# Patient Record
Sex: Female | Born: 1935 | Race: White | Hispanic: No | Smoking: Former smoker
Health system: Southern US, Community
[De-identification: ages and names within clinical notes are randomized; demographics above are authoritative.]

## PROBLEM LIST (undated history)

## (undated) DIAGNOSIS — K922 Gastrointestinal hemorrhage, unspecified: Secondary | ICD-10-CM

## (undated) DIAGNOSIS — M719 Bursopathy, unspecified: Secondary | ICD-10-CM

## (undated) DIAGNOSIS — E860 Dehydration: Secondary | ICD-10-CM

## (undated) DIAGNOSIS — K2970 Gastritis, unspecified, without bleeding: Secondary | ICD-10-CM

## (undated) DIAGNOSIS — I10 Essential (primary) hypertension: Secondary | ICD-10-CM

## (undated) DIAGNOSIS — Z87898 Personal history of other specified conditions: Secondary | ICD-10-CM

## (undated) DIAGNOSIS — G4700 Insomnia, unspecified: Secondary | ICD-10-CM

## (undated) DIAGNOSIS — S2249XA Multiple fractures of ribs, unspecified side, initial encounter for closed fracture: Secondary | ICD-10-CM

## (undated) DIAGNOSIS — S2239XA Fracture of one rib, unspecified side, initial encounter for closed fracture: Secondary | ICD-10-CM

## (undated) DIAGNOSIS — S72009A Fracture of unspecified part of neck of unspecified femur, initial encounter for closed fracture: Secondary | ICD-10-CM

## (undated) DIAGNOSIS — E785 Hyperlipidemia, unspecified: Secondary | ICD-10-CM

## (undated) HISTORY — DX: Fracture of unspecified part of neck of unspecified femur, initial encounter for closed fracture: S72.009A

## (undated) HISTORY — DX: Gastritis, unspecified, without bleeding: K29.70

## (undated) HISTORY — DX: Personal history of other specified conditions: Z87.898

## (undated) HISTORY — DX: Fracture of one rib, unspecified side, initial encounter for closed fracture: S22.39XA

## (undated) HISTORY — DX: Insomnia, unspecified: G47.00

## (undated) HISTORY — PX: HEMIARTHROPLASTY HIP: SUR652

## (undated) HISTORY — DX: Bursopathy, unspecified: M71.9

## (undated) HISTORY — DX: Dehydration: E86.0

## (undated) HISTORY — DX: Essential (primary) hypertension: I10

## (undated) HISTORY — DX: Multiple fractures of ribs, unspecified side, initial encounter for closed fracture: S22.49XA

## (undated) HISTORY — DX: Hyperlipidemia, unspecified: E78.5

## (undated) HISTORY — PX: BACK SURGERY: SHX140

## (undated) HISTORY — PX: ABDOMINAL HYSTERECTOMY: SHX81

## (undated) HISTORY — DX: Gastrointestinal hemorrhage, unspecified: K92.2

---

## 2004-05-20 ENCOUNTER — Ambulatory Visit: Payer: Self-pay | Admitting: Family Medicine

## 2004-10-10 ENCOUNTER — Ambulatory Visit: Payer: Self-pay | Admitting: Family Medicine

## 2005-06-04 ENCOUNTER — Other Ambulatory Visit: Payer: Self-pay

## 2005-06-04 ENCOUNTER — Emergency Department: Payer: Self-pay | Admitting: General Practice

## 2006-01-25 ENCOUNTER — Ambulatory Visit: Payer: Self-pay | Admitting: Family Medicine

## 2006-12-30 ENCOUNTER — Emergency Department: Payer: Self-pay | Admitting: Emergency Medicine

## 2007-02-13 ENCOUNTER — Emergency Department: Payer: Self-pay | Admitting: Emergency Medicine

## 2007-02-28 HISTORY — PX: JOINT REPLACEMENT: SHX530

## 2007-04-28 DIAGNOSIS — S72009A Fracture of unspecified part of neck of unspecified femur, initial encounter for closed fracture: Secondary | ICD-10-CM

## 2007-04-28 HISTORY — DX: Fracture of unspecified part of neck of unspecified femur, initial encounter for closed fracture: S72.009A

## 2007-05-23 ENCOUNTER — Other Ambulatory Visit: Payer: Self-pay

## 2007-05-23 ENCOUNTER — Inpatient Hospital Stay: Payer: Self-pay | Admitting: Specialist

## 2007-05-30 ENCOUNTER — Encounter: Payer: Self-pay | Admitting: Internal Medicine

## 2007-06-12 ENCOUNTER — Ambulatory Visit: Payer: Self-pay | Admitting: Internal Medicine

## 2007-09-02 ENCOUNTER — Ambulatory Visit: Payer: Self-pay | Admitting: Internal Medicine

## 2007-09-11 ENCOUNTER — Ambulatory Visit: Payer: Self-pay | Admitting: Internal Medicine

## 2007-10-29 DIAGNOSIS — K922 Gastrointestinal hemorrhage, unspecified: Secondary | ICD-10-CM

## 2007-10-29 HISTORY — DX: Gastrointestinal hemorrhage, unspecified: K92.2

## 2007-11-06 ENCOUNTER — Inpatient Hospital Stay: Payer: Self-pay | Admitting: Internal Medicine

## 2007-11-08 ENCOUNTER — Other Ambulatory Visit: Payer: Self-pay

## 2008-11-23 ENCOUNTER — Emergency Department: Payer: Self-pay | Admitting: Emergency Medicine

## 2009-03-16 ENCOUNTER — Ambulatory Visit: Payer: Self-pay | Admitting: Internal Medicine

## 2009-11-17 ENCOUNTER — Ambulatory Visit: Payer: Self-pay | Admitting: Specialist

## 2009-11-26 ENCOUNTER — Ambulatory Visit: Payer: Self-pay | Admitting: Cardiovascular Disease

## 2009-12-01 ENCOUNTER — Ambulatory Visit: Payer: Self-pay | Admitting: Specialist

## 2010-01-12 ENCOUNTER — Emergency Department: Payer: Self-pay | Admitting: Emergency Medicine

## 2010-05-09 ENCOUNTER — Ambulatory Visit: Payer: Self-pay | Admitting: Internal Medicine

## 2010-06-14 ENCOUNTER — Ambulatory Visit: Payer: Self-pay | Admitting: Ophthalmology

## 2010-06-27 ENCOUNTER — Other Ambulatory Visit: Payer: Self-pay | Admitting: Internal Medicine

## 2010-06-28 ENCOUNTER — Ambulatory Visit: Payer: Self-pay | Admitting: Ophthalmology

## 2010-07-05 ENCOUNTER — Emergency Department: Payer: Self-pay | Admitting: Emergency Medicine

## 2010-10-05 ENCOUNTER — Encounter: Payer: Self-pay | Admitting: Internal Medicine

## 2010-12-20 ENCOUNTER — Encounter: Payer: Self-pay | Admitting: Internal Medicine

## 2010-12-21 ENCOUNTER — Encounter: Payer: Self-pay | Admitting: Internal Medicine

## 2010-12-21 ENCOUNTER — Ambulatory Visit (INDEPENDENT_AMBULATORY_CARE_PROVIDER_SITE_OTHER): Payer: Medicaid Other | Admitting: Internal Medicine

## 2010-12-21 VITALS — BP 110/66 | HR 94 | Temp 98.1°F | Resp 16 | Ht 63.5 in | Wt 142.8 lb

## 2010-12-21 DIAGNOSIS — M8080XA Other osteoporosis with current pathological fracture, unspecified site, initial encounter for fracture: Secondary | ICD-10-CM

## 2010-12-21 DIAGNOSIS — R634 Abnormal weight loss: Secondary | ICD-10-CM

## 2010-12-21 DIAGNOSIS — Z79899 Other long term (current) drug therapy: Secondary | ICD-10-CM

## 2010-12-21 DIAGNOSIS — M8440XA Pathological fracture, unspecified site, initial encounter for fracture: Secondary | ICD-10-CM

## 2010-12-21 DIAGNOSIS — F1010 Alcohol abuse, uncomplicated: Secondary | ICD-10-CM

## 2010-12-21 DIAGNOSIS — Z1239 Encounter for other screening for malignant neoplasm of breast: Secondary | ICD-10-CM

## 2010-12-21 DIAGNOSIS — H9312 Tinnitus, left ear: Secondary | ICD-10-CM

## 2010-12-21 DIAGNOSIS — H9319 Tinnitus, unspecified ear: Secondary | ICD-10-CM

## 2010-12-21 DIAGNOSIS — D696 Thrombocytopenia, unspecified: Secondary | ICD-10-CM

## 2010-12-21 LAB — COMPREHENSIVE METABOLIC PANEL
ALT: 20 U/L (ref 0–35)
Alkaline Phosphatase: 81 U/L (ref 39–117)
Creatinine, Ser: 0.7 mg/dL (ref 0.4–1.2)
Glucose, Bld: 99 mg/dL (ref 70–99)
Sodium: 139 mEq/L (ref 135–145)
Total Bilirubin: 0.9 mg/dL (ref 0.3–1.2)
Total Protein: 7.4 g/dL (ref 6.0–8.3)

## 2010-12-21 LAB — DIFFERENTIAL
Basophils Absolute: 0 10*3/uL (ref 0.0–0.1)
Eosinophils Relative: 1 % (ref 0–5)
Lymphocytes Relative: 24 % (ref 12–46)
Monocytes Absolute: 0.6 10*3/uL (ref 0.1–1.0)
Monocytes Relative: 8 % (ref 3–12)
Neutro Abs: 4.7 10*3/uL (ref 1.7–7.7)

## 2010-12-21 LAB — LIPID PANEL
HDL: 45.4 mg/dL (ref >39.00)
LDL Cholesterol: 98 mg/dL (ref 0–99)
Total CHOL/HDL Ratio: 4
Triglycerides: 89 mg/dL (ref 0.0–149.0)
VLDL: 17.8 mg/dL (ref 0.0–40.0)

## 2010-12-21 LAB — CBC
HCT: 42.3 % (ref 36.0–46.0)
Hemoglobin: 14.2 g/dL (ref 12.0–15.0)
MCV: 94.8 fL (ref 78.0–100.0)
RDW: 13.5 % (ref 11.5–15.5)
WBC: 7.1 10*3/uL (ref 4.0–10.5)

## 2010-12-21 LAB — TSH: TSH: 1.64 u[IU]/mL (ref 0.35–5.50)

## 2010-12-21 NOTE — Progress Notes (Signed)
  Subjective:    Patient ID: Alyssa Ortega, female    DOB: [birth year], 77 y.o.   MRN: [id number]  HPI Alyssa Ortega is a 77 yo white female with history of left hip fracture secondary to fall, s/p replacement, hypertension,  Ongoing occult alcohol abuse with history of GI bleed secondary to duodenal ulcer who presents for general followup .  Shehas lost 7 lbs since July.  She has periods of malaise but states that her appetite is good.  She is sleeping better since she started taking 1/2 tablet of mirtazipine but continues to have episodes of pounding in her ears that wake her from sleep.  She had had no recent falls,  And denies and new or chonic pain issues. She lives alone and prepares her own meals but does not drive due to loss of visio from cataracts.  She denies ongoing alcohol abuse but there are 2 ER visit (Nov 2011 and May 2012) where her BAL was > 0.08. Past Medical History  Diagnosis Date  . Hyperlipidemia   . Hypertension   . GI bleed 10/2007    secondary to duodenal ulcers  . History of epistaxis     had MRI by Dr. Chestine Spore  . Hip fracture 3/09    left, s/p hemiarthroplasty  . Rib fractures     traumatic  . Insomnia   . Osteoporosis    Current Outpatient Prescriptions on File Prior to Visit  Medication Sig Dispense Refill  . Cholecalciferol (VITAMIN D PO) Take by mouth.        Marland Kitchen lisinopril (PRINIVIL,ZESTRIL) 40 MG tablet Take 40 mg by mouth daily.          Review of Systems  Constitutional: Positive for unexpected weight change. Negative for fever and chills.  HENT: Negative for hearing loss, ear pain, nosebleeds, congestion, sore throat, facial swelling, rhinorrhea, sneezing, mouth sores, trouble swallowing, neck pain, neck stiffness, voice change, postnasal drip, sinus pressure, tinnitus and ear discharge.   Eyes: Negative for pain, discharge, redness and visual disturbance.  Respiratory: Negative for cough, chest tightness, shortness of breath, wheezing and stridor.     Cardiovascular: Negative for chest pain, palpitations and leg swelling.  Musculoskeletal: Negative for myalgias and arthralgias.  Skin: Negative for color change and rash.  Neurological: Negative for dizziness, weakness, light-headedness and headaches.  Hematological: Negative for adenopathy.  All other systems reviewed and are negative.       Objective:   Physical Exam  Vitals reviewed. Constitutional: She is oriented to person, place, and time. She appears well-developed and well-nourished.  HENT:  Mouth/Throat: Oropharynx is clear and moist.  Eyes: EOM are normal. Pupils are equal, round, and reactive to light. No scleral icterus.  Neck: Normal range of motion. Neck supple. No JVD present. No thyromegaly present.  Cardiovascular: Normal rate, regular rhythm, normal heart sounds and intact distal pulses.   Pulmonary/Chest: Effort normal and breath sounds normal.  Abdominal: Soft. Bowel sounds are normal. She exhibits no mass. There is no tenderness.  Musculoskeletal: Normal range of motion. She exhibits no edema.  Lymphadenopathy:    She has no cervical adenopathy.  Neurological: She is alert and oriented to person, place, and time.  Skin: Skin is warm and dry.  Psychiatric: She has a normal mood and affect.          Assessment & Plan:

## 2010-12-21 NOTE — Patient Instructions (Signed)
We will call you with your lab results.   continue your current medications and use your cane whenever walking

## 2010-12-22 ENCOUNTER — Telehealth: Payer: Self-pay | Admitting: *Deleted

## 2010-12-22 ENCOUNTER — Encounter: Payer: Self-pay | Admitting: Internal Medicine

## 2010-12-22 DIAGNOSIS — D696 Thrombocytopenia, unspecified: Secondary | ICD-10-CM | POA: Insufficient documentation

## 2010-12-22 DIAGNOSIS — M8080XA Other osteoporosis with current pathological fracture, unspecified site, initial encounter for fracture: Secondary | ICD-10-CM | POA: Insufficient documentation

## 2010-12-22 DIAGNOSIS — F1010 Alcohol abuse, uncomplicated: Secondary | ICD-10-CM | POA: Insufficient documentation

## 2010-12-22 DIAGNOSIS — Z87898 Personal history of other specified conditions: Secondary | ICD-10-CM | POA: Insufficient documentation

## 2010-12-22 DIAGNOSIS — Z1239 Encounter for other screening for malignant neoplasm of breast: Secondary | ICD-10-CM | POA: Insufficient documentation

## 2010-12-22 DIAGNOSIS — R634 Abnormal weight loss: Secondary | ICD-10-CM | POA: Insufficient documentation

## 2010-12-22 DIAGNOSIS — S2239XA Fracture of one rib, unspecified side, initial encounter for closed fracture: Secondary | ICD-10-CM | POA: Insufficient documentation

## 2010-12-22 DIAGNOSIS — S2249XA Multiple fractures of ribs, unspecified side, initial encounter for closed fracture: Secondary | ICD-10-CM | POA: Insufficient documentation

## 2010-12-22 DIAGNOSIS — H9312 Tinnitus, left ear: Secondary | ICD-10-CM | POA: Insufficient documentation

## 2010-12-22 NOTE — Assessment & Plan Note (Signed)
She is not a candidate for bisphosphonate therapy due to duodenal ulcer and ongoing alcohol abuse. Will discuss Prolia with her at next visit.

## 2010-12-22 NOTE — Assessment & Plan Note (Signed)
Mild, epsiodic, likely related to alcohol abuse since she has normal WBC and is not anemic. Will check again in 3 months along with ann LDH.

## 2010-12-22 NOTE — Assessment & Plan Note (Signed)
Not sure what the cause is or whether tinnitus is the correct diagnosis.  It occurs only in the middle of the night. Given her accompanying headache, I sent her for an MRI last March which was negative for masses and strokes. Could be related to blood pressure and alcohol abuse.    Will send her for audiology eval if symptoms start to occur during the day.

## 2010-12-22 NOTE — Telephone Encounter (Signed)
Error

## 2010-12-22 NOTE — Assessment & Plan Note (Signed)
Her weight loss is concerning for occult malignancy since she has has eschewed regular cancer screening.  However, given her concurrent thrombocytopenia I suspect that the cause is malnutrition and alcohol abuse , which she has denied but is confirmed by review of her last 2 ER visits .  Will discuss this witherh er at next visit.

## 2010-12-22 NOTE — Assessment & Plan Note (Signed)
She has had GI bleeds and delirium in the past and per review of ER records continues to abuse alcohol to the point of inebriation.  She now has mild thrombocytopenia.  Will discuss this with her at next visit.

## 2010-12-28 ENCOUNTER — Telehealth: Payer: Self-pay | Admitting: *Deleted

## 2010-12-28 MED ORDER — AMOXICILLIN-POT CLAVULANATE 875-125 MG PO TABS: 1.0000 | ORAL_TABLET | Freq: Two times a day (BID) | ORAL | Status: AC

## 2010-12-28 NOTE — Telephone Encounter (Signed)
Notified patient of message, Rx has been called in.   

## 2010-12-28 NOTE — Telephone Encounter (Signed)
Patient says that she has had a a lot of congestion, coughing up phlegm, has had fever on and off. Feels week. I advised her to make an appt, but she says that she doesn't want to make an appt. She says that she would either like something called in or advise of what to take otc. Please advise.

## 2010-12-28 NOTE — Telephone Encounter (Signed)
Please see previous closed re authorization to call her in  augmentin  Rx,  tylenol and Delsym OTC for fevers and cough

## 2010-12-28 NOTE — Telephone Encounter (Signed)
Addended by: Darletta Moll A on: 12/28/2010 02:56 PM   Modules accepted: Orders

## 2010-12-28 NOTE — Telephone Encounter (Signed)
She was just seen recently.  Does not need to come in .Marland KitchenRx Augmentin 875/125 one tablet twice daily   With food for 7 days  #14  No refills.   Delsym, OTC for cough,  tylonol for muscle aches and fevers.

## 2011-02-04 ENCOUNTER — Emergency Department: Payer: Self-pay | Admitting: Emergency Medicine

## 2011-02-27 ENCOUNTER — Inpatient Hospital Stay: Payer: Self-pay | Admitting: Internal Medicine

## 2011-02-28 LAB — LIPID PANEL
Cholesterol: 117 mg/dL (ref 0–200)
HDL Cholesterol: 30 mg/dL — ABNORMAL LOW (ref 40–60)
Ldl Cholesterol, Calc: 69 mg/dL (ref 0–100)
Triglycerides: 88 mg/dL (ref 0–200)
VLDL Cholesterol, Calc: 18 mg/dL (ref 5–40)

## 2011-02-28 LAB — TROPONIN I: Troponin-I: 0.05 ng/mL

## 2011-02-28 LAB — LIPASE, BLOOD: Lipase: 250 U/L (ref 73–393)

## 2011-02-28 LAB — HEMOGLOBIN A1C: Hemoglobin A1C: 4.7 % (ref 4.2–6.3)

## 2011-03-24 ENCOUNTER — Ambulatory Visit: Payer: Medicaid Other | Admitting: Internal Medicine

## 2011-05-10 ENCOUNTER — Emergency Department: Payer: Self-pay

## 2011-06-11 ENCOUNTER — Emergency Department: Payer: Self-pay | Admitting: Emergency Medicine

## 2011-06-11 LAB — ETHANOL: Ethanol: 249 mg/dL

## 2011-09-11 ENCOUNTER — Inpatient Hospital Stay: Payer: Self-pay | Admitting: Orthopedic Surgery

## 2011-09-11 LAB — URINALYSIS, COMPLETE
Ketone: NEGATIVE
Ph: 6 (ref 4.5–8.0)
Protein: NEGATIVE
RBC,UR: <1 /HPF (ref 0–5)
Specific Gravity: 1.004 (ref 1.003–1.030)

## 2011-09-11 LAB — CBC WITH DIFFERENTIAL/PLATELET
Basophil #: 0 10*3/uL (ref 0.0–0.1)
Eosinophil %: 0.5 %
HCT: 35.9 % (ref 35.0–47.0)
HGB: 12 g/dL (ref 12.0–16.0)
Lymphocyte #: 1.3 10*3/uL (ref 1.0–3.6)
MCH: 30.9 pg (ref 26.0–34.0)
MCHC: 33.5 g/dL (ref 32.0–36.0)
Monocyte #: 0.6 x10 3/mm (ref 0.2–0.9)
Neutrophil #: 6 10*3/uL (ref 1.4–6.5)
Platelet: 128 10*3/uL — ABNORMAL LOW (ref 150–440)
RBC: 3.9 10*6/uL (ref 3.80–5.20)

## 2011-09-11 LAB — COMPREHENSIVE METABOLIC PANEL
Alkaline Phosphatase: 95 U/L (ref 50–136)
BUN: 4 mg/dL — ABNORMAL LOW (ref 7–18)
Bilirubin,Total: 0.3 mg/dL (ref 0.2–1.0)
Calcium, Total: 8.2 mg/dL — ABNORMAL LOW (ref 8.5–10.1)
Creatinine: 0.6 mg/dL (ref 0.60–1.30)
EGFR (African American): >60
EGFR (Non-African Amer.): >60
Glucose: 95 mg/dL (ref 65–99)
Potassium: 3.5 mmol/L (ref 3.5–5.1)
SGOT(AST): 40 U/L — ABNORMAL HIGH (ref 15–37)
SGPT (ALT): 27 U/L
Sodium: 131 mmol/L — ABNORMAL LOW (ref 136–145)
Total Protein: 6.5 g/dL (ref 6.4–8.2)

## 2011-09-11 LAB — PROTIME-INR: INR: 1

## 2011-09-11 LAB — APTT: Activated PTT: 32.6 secs (ref 23.6–35.9)

## 2011-09-11 LAB — CK: CK, Total: 73 U/L (ref 21–215)

## 2011-09-11 LAB — TROPONIN I: Troponin-I: <0.02 ng/mL

## 2011-09-12 LAB — BASIC METABOLIC PANEL
Chloride: 100 mmol/L (ref 98–107)
Co2: 24 mmol/L (ref 21–32)
Creatinine: 0.74 mg/dL (ref 0.60–1.30)
EGFR (African American): >60
Sodium: 133 mmol/L — ABNORMAL LOW (ref 136–145)

## 2011-09-12 LAB — PLATELET COUNT: Platelet: 104 10*3/uL — ABNORMAL LOW (ref 150–440)

## 2011-09-15 LAB — PATHOLOGY REPORT

## 2011-09-17 ENCOUNTER — Other Ambulatory Visit: Payer: Self-pay | Admitting: Family Medicine

## 2011-09-17 LAB — URINALYSIS, COMPLETE
Bilirubin,UR: NEGATIVE
Blood: NEGATIVE
Ph: 6 (ref 4.5–8.0)
Protein: NEGATIVE
RBC,UR: NONE SEEN /HPF (ref 0–5)
Squamous Epithelial: <1
WBC UR: 7 /HPF (ref 0–5)

## 2011-10-25 ENCOUNTER — Other Ambulatory Visit: Payer: Self-pay | Admitting: Internal Medicine

## 2011-10-25 MED ORDER — LISINOPRIL 40 MG PO TABS: 40.0000 mg | ORAL_TABLET | Freq: Every day | ORAL | Status: DC

## 2011-11-22 ENCOUNTER — Ambulatory Visit: Payer: Self-pay | Admitting: Family Medicine

## 2012-01-02 ENCOUNTER — Other Ambulatory Visit: Payer: Self-pay

## 2012-01-04 ENCOUNTER — Other Ambulatory Visit: Payer: Self-pay

## 2012-03-12 ENCOUNTER — Ambulatory Visit: Payer: Self-pay | Admitting: Family Medicine

## 2012-05-09 ENCOUNTER — Ambulatory Visit: Payer: Self-pay | Admitting: Family Medicine

## 2012-07-02 ENCOUNTER — Emergency Department: Payer: Self-pay | Admitting: Emergency Medicine

## 2012-07-12 ENCOUNTER — Emergency Department: Payer: Self-pay | Admitting: Emergency Medicine

## 2012-07-13 LAB — URINALYSIS, COMPLETE
Bilirubin,UR: NEGATIVE
Ketone: NEGATIVE
Nitrite: POSITIVE
Ph: 6 (ref 4.5–8.0)
RBC,UR: 8 /HPF (ref 0–5)
Specific Gravity: 1.003 (ref 1.003–1.030)
Squamous Epithelial: 5

## 2012-07-13 LAB — COMPREHENSIVE METABOLIC PANEL
Anion Gap: 10 (ref 7–16)
Co2: 23 mmol/L (ref 21–32)
Glucose: 98 mg/dL (ref 65–99)
Potassium: 4 mmol/L (ref 3.5–5.1)

## 2012-07-13 LAB — CBC
HGB: 11.6 g/dL — ABNORMAL LOW (ref 12.0–16.0)
MCV: 91 fL (ref 80–100)
Platelet: 189 10*3/uL (ref 150–440)
RDW: 15 % — ABNORMAL HIGH (ref 11.5–14.5)
WBC: 5.3 10*3/uL (ref 3.6–11.0)

## 2012-07-13 LAB — ETHANOL
Ethanol %: 0.15 % — ABNORMAL HIGH (ref 0.000–0.080)
Ethanol: 150 mg/dL

## 2012-08-07 ENCOUNTER — Emergency Department: Payer: Self-pay | Admitting: Emergency Medicine

## 2012-08-07 LAB — BASIC METABOLIC PANEL
Anion Gap: 12 (ref 7–16)
BUN: 3 mg/dL — ABNORMAL LOW (ref 7–18)
Calcium, Total: 8.3 mg/dL — ABNORMAL LOW (ref 8.5–10.1)
Chloride: 101 mmol/L (ref 98–107)
Creatinine: 0.49 mg/dL — ABNORMAL LOW (ref 0.60–1.30)
EGFR (African American): >60
EGFR (Non-African Amer.): >60
Glucose: 110 mg/dL — ABNORMAL HIGH (ref 65–99)
Osmolality: 269 (ref 275–301)
Potassium: 3.9 mmol/L (ref 3.5–5.1)
Sodium: 136 mmol/L (ref 136–145)

## 2012-08-07 LAB — ETHANOL: Ethanol: 141 mg/dL

## 2012-08-07 LAB — CBC
HCT: 35.9 % (ref 35.0–47.0)
HGB: 12.3 g/dL (ref 12.0–16.0)
RDW: 14.6 % — ABNORMAL HIGH (ref 11.5–14.5)
WBC: 7 10*3/uL (ref 3.6–11.0)

## 2012-09-07 ENCOUNTER — Observation Stay: Payer: Self-pay | Admitting: Internal Medicine

## 2012-09-07 LAB — URINALYSIS, COMPLETE
Blood: NEGATIVE
Glucose,UR: NEGATIVE mg/dL (ref 0–75)
Ketone: NEGATIVE
Leukocyte Esterase: NEGATIVE
Ph: 7 (ref 4.5–8.0)
Protein: NEGATIVE
RBC,UR: <1 /HPF (ref 0–5)
Specific Gravity: 1.006 (ref 1.003–1.030)
Squamous Epithelial: 2
WBC UR: 1 /HPF (ref 0–5)

## 2012-09-07 LAB — ETHANOL: Ethanol: <3 mg/dL

## 2012-09-07 LAB — COMPREHENSIVE METABOLIC PANEL
Alkaline Phosphatase: 204 U/L — ABNORMAL HIGH (ref 50–136)
Anion Gap: 10 (ref 7–16)
Co2: 28 mmol/L (ref 21–32)
Creatinine: 0.73 mg/dL (ref 0.60–1.30)
EGFR (Non-African Amer.): >60
Glucose: 108 mg/dL — ABNORMAL HIGH (ref 65–99)
Osmolality: 249 (ref 275–301)
SGOT(AST): 34 U/L (ref 15–37)
Sodium: 125 mmol/L — ABNORMAL LOW (ref 136–145)
Total Protein: 7.5 g/dL (ref 6.4–8.2)

## 2012-09-07 LAB — CBC
HGB: 13.1 g/dL (ref 12.0–16.0)
MCHC: 34.9 g/dL (ref 32.0–36.0)
MCV: 88 fL (ref 80–100)
Platelet: 184 10*3/uL (ref 150–440)
RBC: 4.29 10*6/uL (ref 3.80–5.20)
RDW: 15.4 % — ABNORMAL HIGH (ref 11.5–14.5)

## 2012-09-07 LAB — URIC ACID: Uric Acid: 3.9 mg/dL (ref 2.6–6.0)

## 2012-09-07 LAB — OSMOLALITY: Osmolality: 271 mOsm/kg — ABNORMAL LOW (ref 280–301)

## 2012-09-07 LAB — SODIUM, URINE, RANDOM: Sodium, Urine Random: 24 mmol/L (ref 20–110)

## 2012-09-07 LAB — OSMOLALITY, URINE: Osmolality: 149 mOsm/kg

## 2012-09-08 ENCOUNTER — Ambulatory Visit: Payer: Self-pay | Admitting: Orthopedic Surgery

## 2012-09-08 LAB — BASIC METABOLIC PANEL
Anion Gap: 4 — ABNORMAL LOW (ref 7–16)
Calcium, Total: 8.3 mg/dL — ABNORMAL LOW (ref 8.5–10.1)
Chloride: 99 mmol/L (ref 98–107)
Creatinine: 0.74 mg/dL (ref 0.60–1.30)
Osmolality: 264 (ref 275–301)
Sodium: 134 mmol/L — ABNORMAL LOW (ref 136–145)

## 2012-09-08 LAB — MAGNESIUM: Magnesium: 1.8 mg/dL

## 2012-09-08 LAB — CBC WITH DIFFERENTIAL/PLATELET
Eosinophil #: 0.1 10*3/uL (ref 0.0–0.7)
HCT: 33.1 % — ABNORMAL LOW (ref 35.0–47.0)
HGB: 11.2 g/dL — ABNORMAL LOW (ref 12.0–16.0)
Lymphocyte #: 1.1 10*3/uL (ref 1.0–3.6)
Lymphocyte %: 26.8 %
MCH: 30.6 pg (ref 26.0–34.0)
MCV: 90 fL (ref 80–100)
Monocyte #: 0.5 x10 3/mm (ref 0.2–0.9)
Neutrophil %: 56.6 %
RBC: 3.68 10*6/uL — ABNORMAL LOW (ref 3.80–5.20)
RDW: 15.1 % — ABNORMAL HIGH (ref 11.5–14.5)
WBC: 4.1 10*3/uL (ref 3.6–11.0)

## 2012-09-11 LAB — BASIC METABOLIC PANEL
Calcium, Total: 8.2 mg/dL — ABNORMAL LOW (ref 8.5–10.1)
Chloride: 106 mmol/L (ref 98–107)
Co2: 25 mmol/L (ref 21–32)
EGFR (African American): >60
Glucose: 60 mg/dL — ABNORMAL LOW (ref 65–99)
Osmolality: 272 (ref 275–301)
Potassium: 3 mmol/L — ABNORMAL LOW (ref 3.5–5.1)
Sodium: 139 mmol/L (ref 136–145)

## 2012-09-12 LAB — BASIC METABOLIC PANEL
Anion Gap: 9 (ref 7–16)
BUN: 4 mg/dL — ABNORMAL LOW (ref 7–18)
Chloride: 104 mmol/L (ref 98–107)
Co2: 24 mmol/L (ref 21–32)
Creatinine: 0.48 mg/dL — ABNORMAL LOW (ref 0.60–1.30)
EGFR (African American): >60
EGFR (Non-African Amer.): >60
Osmolality: 270 (ref 275–301)
Potassium: 3.5 mmol/L (ref 3.5–5.1)

## 2012-09-13 LAB — PATHOLOGY REPORT

## 2012-11-18 ENCOUNTER — Ambulatory Visit: Payer: Self-pay | Admitting: Orthopedic Surgery

## 2012-11-20 ENCOUNTER — Ambulatory Visit: Payer: Self-pay | Admitting: Orthopedic Surgery

## 2012-11-21 LAB — PATHOLOGY REPORT

## 2012-12-12 ENCOUNTER — Emergency Department: Payer: Self-pay | Admitting: Emergency Medicine

## 2012-12-12 LAB — URINALYSIS, COMPLETE
Bacteria: NONE SEEN
Blood: NEGATIVE
Glucose,UR: NEGATIVE mg/dL (ref 0–75)
Nitrite: NEGATIVE
Protein: NEGATIVE
RBC,UR: NONE SEEN /HPF (ref 0–5)
Specific Gravity: 1.003 (ref 1.003–1.030)
WBC UR: <1 /HPF (ref 0–5)

## 2012-12-18 ENCOUNTER — Emergency Department: Payer: Self-pay | Admitting: Emergency Medicine

## 2012-12-18 LAB — ETHANOL
Ethanol %: 0.234 % — ABNORMAL HIGH (ref 0.000–0.080)
Ethanol: 234 mg/dL

## 2012-12-18 LAB — COMPREHENSIVE METABOLIC PANEL
Alkaline Phosphatase: 138 U/L — ABNORMAL HIGH (ref 50–136)
BUN: 5 mg/dL — ABNORMAL LOW (ref 7–18)
Calcium, Total: 8.8 mg/dL (ref 8.5–10.1)
Chloride: 94 mmol/L — ABNORMAL LOW (ref 98–107)
Co2: 25 mmol/L (ref 21–32)
Creatinine: 0.58 mg/dL — ABNORMAL LOW (ref 0.60–1.30)
EGFR (African American): >60
EGFR (Non-African Amer.): >60
Glucose: 102 mg/dL — ABNORMAL HIGH (ref 65–99)
Osmolality: 251 (ref 275–301)
Potassium: 4.3 mmol/L (ref 3.5–5.1)
SGPT (ALT): 16 U/L (ref 12–78)

## 2012-12-18 LAB — CBC
HGB: 13 g/dL (ref 12.0–16.0)
MCH: 31.4 pg (ref 26.0–34.0)
MCHC: 34.9 g/dL (ref 32.0–36.0)
MCV: 90 fL (ref 80–100)
Platelet: 191 10*3/uL (ref 150–440)
RBC: 4.13 10*6/uL (ref 3.80–5.20)
RDW: 15.6 % — ABNORMAL HIGH (ref 11.5–14.5)
WBC: 6.3 10*3/uL (ref 3.6–11.0)

## 2013-04-10 ENCOUNTER — Ambulatory Visit: Payer: Self-pay | Admitting: Orthopedic Surgery

## 2013-04-10 LAB — CBC
HCT: 39.2 % (ref 35.0–47.0)
HGB: 13.1 g/dL (ref 12.0–16.0)
MCH: 31.3 pg (ref 26.0–34.0)
MCHC: 33.4 g/dL (ref 32.0–36.0)
MCV: 94 fL (ref 80–100)
PLATELETS: 112 10*3/uL — AB (ref 150–440)
RBC: 4.17 10*6/uL (ref 3.80–5.20)
RDW: 14.8 % — ABNORMAL HIGH (ref 11.5–14.5)
WBC: 7.8 10*3/uL (ref 3.6–11.0)

## 2013-04-10 LAB — URINALYSIS, COMPLETE
Bilirubin,UR: NEGATIVE
Blood: NEGATIVE
Glucose,UR: NEGATIVE mg/dL (ref 0–75)
KETONE: NEGATIVE
Nitrite: NEGATIVE
PROTEIN: NEGATIVE
Ph: 6 (ref 4.5–8.0)
SPECIFIC GRAVITY: 1.017 (ref 1.003–1.030)
Squamous Epithelial: 9
WBC UR: 60 /HPF (ref 0–5)

## 2013-04-10 LAB — PROTIME-INR
INR: 1
Prothrombin Time: 12.9 secs (ref 11.5–14.7)

## 2013-04-10 LAB — BASIC METABOLIC PANEL
ANION GAP: 6 — AB (ref 7–16)
BUN: 8 mg/dL (ref 7–18)
CO2: 27 mmol/L (ref 21–32)
Calcium, Total: 9.5 mg/dL (ref 8.5–10.1)
Chloride: 100 mmol/L (ref 98–107)
Creatinine: 0.58 mg/dL — ABNORMAL LOW (ref 0.60–1.30)
EGFR (African American): >60
EGFR (Non-African Amer.): >60
Glucose: 105 mg/dL — ABNORMAL HIGH (ref 65–99)
Osmolality: 265 (ref 275–301)
POTASSIUM: 3.7 mmol/L (ref 3.5–5.1)
SODIUM: 133 mmol/L — AB (ref 136–145)

## 2013-04-10 LAB — APTT: ACTIVATED PTT: 31.7 s (ref 23.6–35.9)

## 2013-04-10 LAB — SEDIMENTATION RATE: ERYTHROCYTE SED RATE: 20 mm/h (ref 0–30)

## 2013-04-11 LAB — MRSA PCR SCREENING

## 2013-04-22 ENCOUNTER — Inpatient Hospital Stay: Payer: Self-pay | Admitting: Orthopedic Surgery

## 2013-04-22 LAB — CBC WITH DIFFERENTIAL/PLATELET
BASOS ABS: 0.1 10*3/uL (ref 0.0–0.1)
BASOS PCT: 0.9 %
EOS ABS: 0.1 10*3/uL (ref 0.0–0.7)
EOS PCT: 2.2 %
HCT: 40.9 % (ref 35.0–47.0)
HGB: 13.7 g/dL (ref 12.0–16.0)
Lymphocyte #: 1.2 10*3/uL (ref 1.0–3.6)
Lymphocyte %: 21.1 %
MCH: 32 pg (ref 26.0–34.0)
MCHC: 33.6 g/dL (ref 32.0–36.0)
MCV: 95 fL (ref 80–100)
Monocyte #: 0.5 x10 3/mm (ref 0.2–0.9)
Monocyte %: 9 %
Neutrophil #: 3.9 10*3/uL (ref 1.4–6.5)
Neutrophil %: 66.8 %
Platelet: 145 10*3/uL — ABNORMAL LOW (ref 150–440)
RBC: 4.28 10*6/uL (ref 3.80–5.20)
RDW: 14.1 % (ref 11.5–14.5)
WBC: 5.9 10*3/uL (ref 3.6–11.0)

## 2013-04-22 LAB — BASIC METABOLIC PANEL
Anion Gap: 5 — ABNORMAL LOW (ref 7–16)
BUN: 4 mg/dL — AB (ref 7–18)
CHLORIDE: 105 mmol/L (ref 98–107)
CREATININE: 0.69 mg/dL (ref 0.60–1.30)
Calcium, Total: 9.4 mg/dL (ref 8.5–10.1)
Co2: 30 mmol/L (ref 21–32)
EGFR (Non-African Amer.): >60
GLUCOSE: 90 mg/dL (ref 65–99)
OSMOLALITY: 276 (ref 275–301)
Potassium: 3.5 mmol/L (ref 3.5–5.1)
Sodium: 140 mmol/L (ref 136–145)

## 2013-04-23 LAB — BASIC METABOLIC PANEL
Anion Gap: 5 — ABNORMAL LOW (ref 7–16)
BUN: 5 mg/dL — AB (ref 7–18)
CALCIUM: 7.8 mg/dL — AB (ref 8.5–10.1)
CO2: 27 mmol/L (ref 21–32)
Chloride: 107 mmol/L (ref 98–107)
Creatinine: 0.84 mg/dL (ref 0.60–1.30)
EGFR (African American): >60
EGFR (Non-African Amer.): >60
Glucose: 82 mg/dL (ref 65–99)
Osmolality: 274 (ref 275–301)
POTASSIUM: 3.3 mmol/L — AB (ref 3.5–5.1)
SODIUM: 139 mmol/L (ref 136–145)

## 2013-04-23 LAB — CBC WITH DIFFERENTIAL/PLATELET
Basophil #: 0 10*3/uL (ref 0.0–0.1)
Basophil %: 0.4 %
Eosinophil #: 0.1 10*3/uL (ref 0.0–0.7)
Eosinophil %: 1.1 %
HCT: 26.2 % — AB (ref 35.0–47.0)
HGB: 9.1 g/dL — ABNORMAL LOW (ref 12.0–16.0)
LYMPHS PCT: 16.2 %
Lymphocyte #: 1.1 10*3/uL (ref 1.0–3.6)
MCH: 32.8 pg (ref 26.0–34.0)
MCHC: 34.6 g/dL (ref 32.0–36.0)
MCV: 95 fL (ref 80–100)
MONOS PCT: 9.1 %
Monocyte #: 0.6 x10 3/mm (ref 0.2–0.9)
NEUTROS ABS: 5 10*3/uL (ref 1.4–6.5)
Neutrophil %: 73.2 %
Platelet: 107 10*3/uL — ABNORMAL LOW (ref 150–440)
RBC: 2.76 10*6/uL — ABNORMAL LOW (ref 3.80–5.20)
RDW: 14.3 % (ref 11.5–14.5)
WBC: 6.8 10*3/uL (ref 3.6–11.0)

## 2013-04-24 LAB — BASIC METABOLIC PANEL
ANION GAP: 5 — AB (ref 7–16)
BUN: 5 mg/dL — ABNORMAL LOW (ref 7–18)
CHLORIDE: 105 mmol/L (ref 98–107)
CREATININE: 0.62 mg/dL (ref 0.60–1.30)
Calcium, Total: 8.2 mg/dL — ABNORMAL LOW (ref 8.5–10.1)
Co2: 25 mmol/L (ref 21–32)
EGFR (African American): >60
Glucose: 66 mg/dL (ref 65–99)
OSMOLALITY: 266 (ref 275–301)
POTASSIUM: 3.8 mmol/L (ref 3.5–5.1)
Sodium: 135 mmol/L — ABNORMAL LOW (ref 136–145)

## 2013-04-24 LAB — CBC
HCT: 26.6 % — ABNORMAL LOW (ref 35.0–47.0)
HGB: 9.1 g/dL — ABNORMAL LOW (ref 12.0–16.0)
MCH: 32.5 pg (ref 26.0–34.0)
MCHC: 34.2 g/dL (ref 32.0–36.0)
MCV: 95 fL (ref 80–100)
Platelet: 94 10*3/uL — ABNORMAL LOW (ref 150–440)
RBC: 2.79 10*6/uL — ABNORMAL LOW (ref 3.80–5.20)
RDW: 14 % (ref 11.5–14.5)
WBC: 9.9 10*3/uL (ref 3.6–11.0)

## 2013-04-25 ENCOUNTER — Encounter: Payer: Self-pay | Admitting: Internal Medicine

## 2013-04-25 LAB — HEMOGLOBIN: HGB: 8.7 g/dL — ABNORMAL LOW (ref 12.0–16.0)

## 2013-04-25 LAB — CBC
HCT: 23.6 % — ABNORMAL LOW (ref 35.0–47.0)
HGB: 8.2 g/dL — ABNORMAL LOW (ref 12.0–16.0)
MCH: 33 pg (ref 26.0–34.0)
MCHC: 34.9 g/dL (ref 32.0–36.0)
MCV: 95 fL (ref 80–100)
Platelet: 110 10*3/uL — ABNORMAL LOW (ref 150–440)
RBC: 2.5 10*6/uL — ABNORMAL LOW (ref 3.80–5.20)
RDW: 14 % (ref 11.5–14.5)
WBC: 7.5 10*3/uL (ref 3.6–11.0)

## 2013-04-27 ENCOUNTER — Encounter: Payer: Self-pay | Admitting: Internal Medicine

## 2013-06-28 ENCOUNTER — Emergency Department: Payer: Self-pay | Admitting: Internal Medicine

## 2013-06-28 LAB — URINALYSIS, COMPLETE
BILIRUBIN, UR: NEGATIVE
Bacteria: NONE SEEN
Blood: NEGATIVE
Glucose,UR: NEGATIVE mg/dL (ref 0–75)
KETONE: NEGATIVE
Leukocyte Esterase: NEGATIVE
Nitrite: NEGATIVE
PH: 6 (ref 4.5–8.0)
Protein: NEGATIVE
RBC,UR: <1 /HPF (ref 0–5)
Specific Gravity: 1.002 (ref 1.003–1.030)
Squamous Epithelial: NONE SEEN
WBC UR: 2 /HPF (ref 0–5)

## 2013-06-28 LAB — COMPREHENSIVE METABOLIC PANEL
ALK PHOS: 133 U/L — AB
ALT: 21 U/L (ref 12–78)
Albumin: 3.4 g/dL (ref 3.4–5.0)
Anion Gap: 10 (ref 7–16)
BUN: 4 mg/dL — AB (ref 7–18)
Bilirubin,Total: 0.2 mg/dL (ref 0.2–1.0)
Calcium, Total: 8.8 mg/dL (ref 8.5–10.1)
Chloride: 100 mmol/L (ref 98–107)
Co2: 26 mmol/L (ref 21–32)
Creatinine: 0.47 mg/dL — ABNORMAL LOW (ref 0.60–1.30)
GLUCOSE: 94 mg/dL (ref 65–99)
Osmolality: 269 (ref 275–301)
Potassium: 3.5 mmol/L (ref 3.5–5.1)
SGOT(AST): 33 U/L (ref 15–37)
Sodium: 136 mmol/L (ref 136–145)
Total Protein: 7.7 g/dL (ref 6.4–8.2)

## 2013-06-28 LAB — PROTIME-INR
INR: 1
Prothrombin Time: 13.1 secs (ref 11.5–14.7)

## 2013-06-28 LAB — CBC
HCT: 35.2 % (ref 35.0–47.0)
HGB: 11.4 g/dL — AB (ref 12.0–16.0)
MCH: 26.8 pg (ref 26.0–34.0)
MCHC: 32.4 g/dL (ref 32.0–36.0)
MCV: 83 fL (ref 80–100)
Platelet: 171 10*3/uL (ref 150–440)
RBC: 4.26 10*6/uL (ref 3.80–5.20)
RDW: 18.3 % — AB (ref 11.5–14.5)
WBC: 4.5 10*3/uL (ref 3.6–11.0)

## 2013-07-16 IMAGING — CR DG LUMBAR SPINE 2-3V
1 series · 4 of 4 positions shown · non-contrast
Comparison: none

REASON FOR EXAM: BACK PAIN
COMMENTS:   May transport without cardiac monitor

[Series 1: t lumbar spine ap · 0.14mm/px · 4 of 4 slices shown]
[im 1/4]
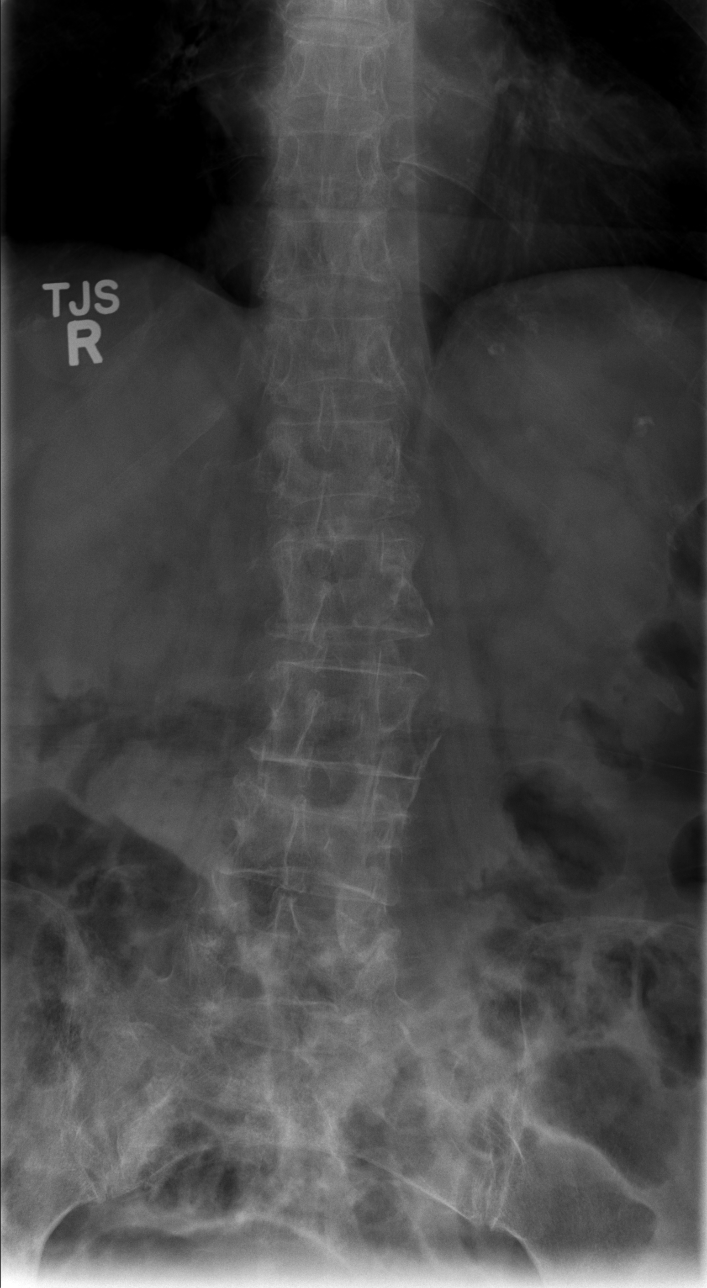
[im 2/4]
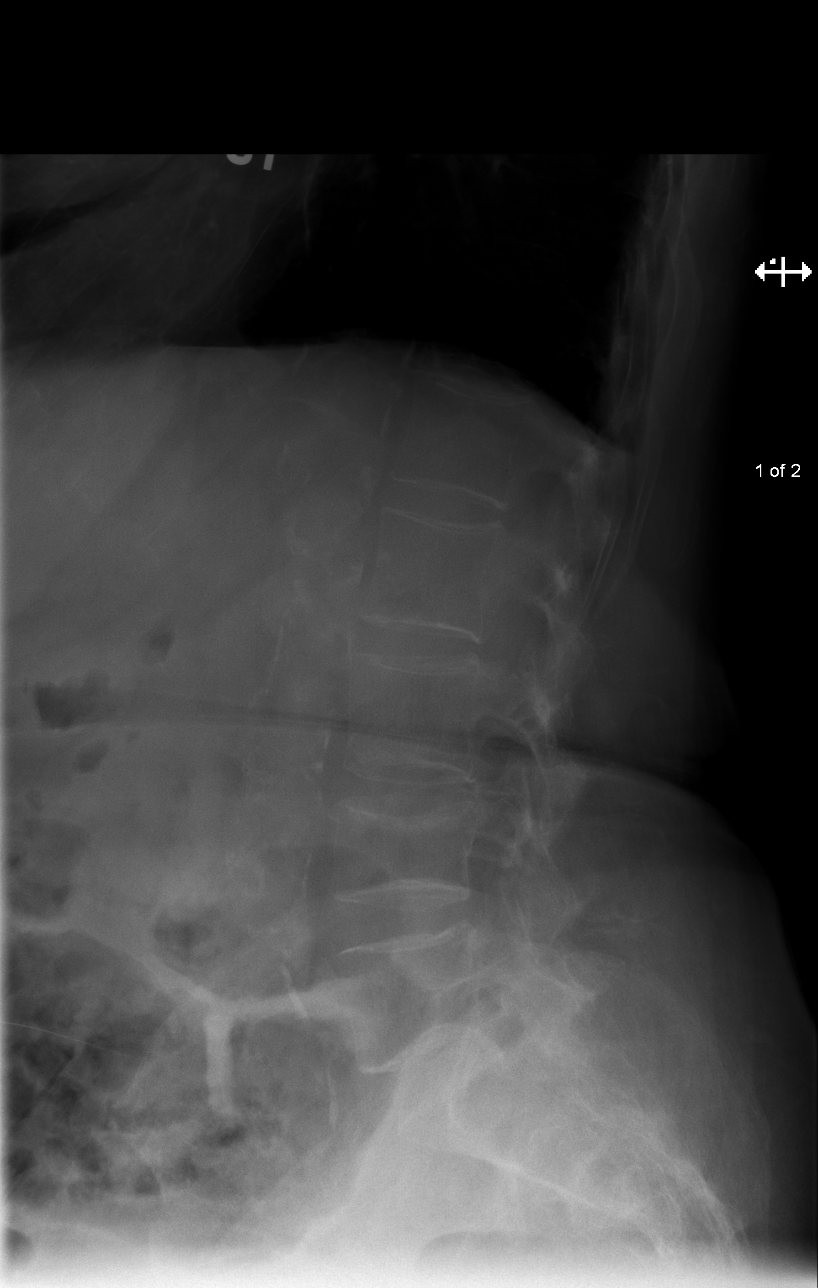
[im 3/4]
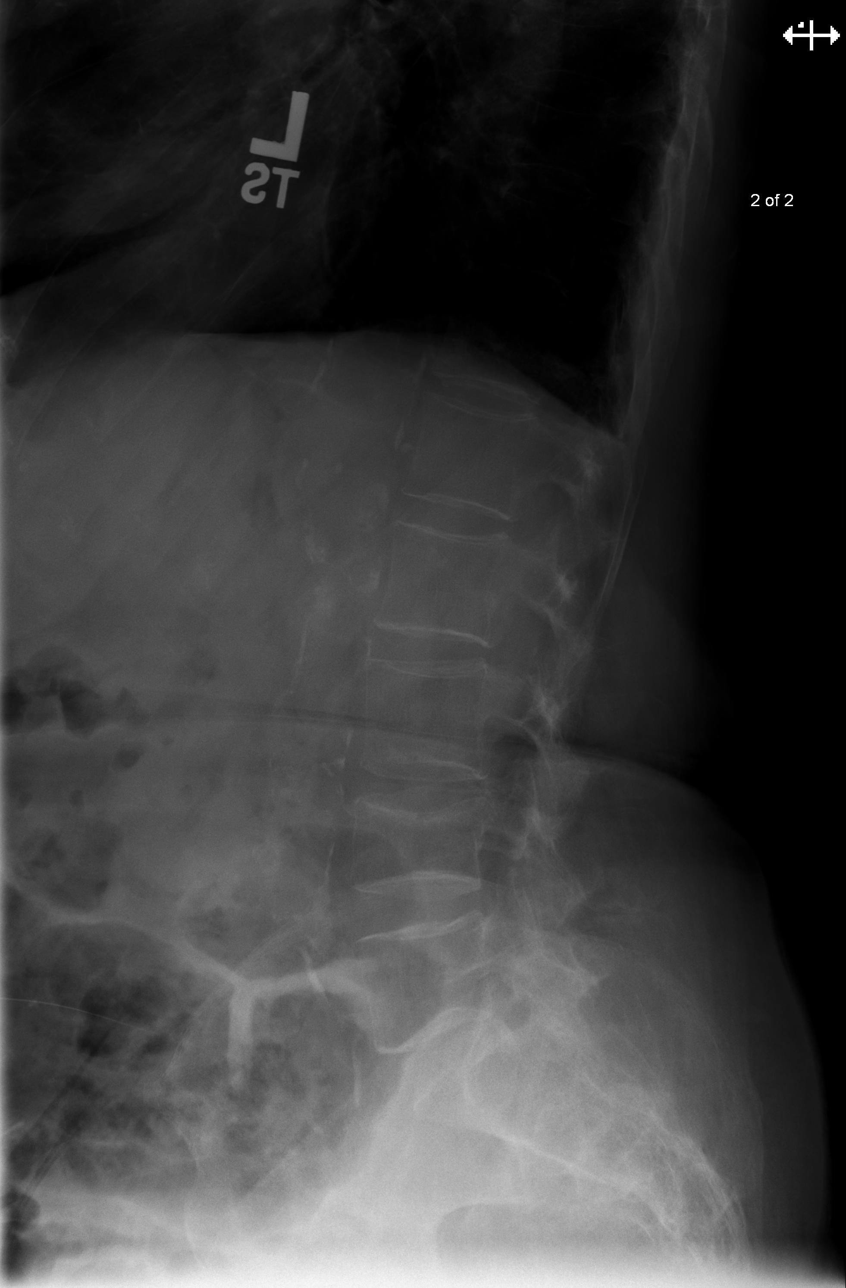
[im 4/4]
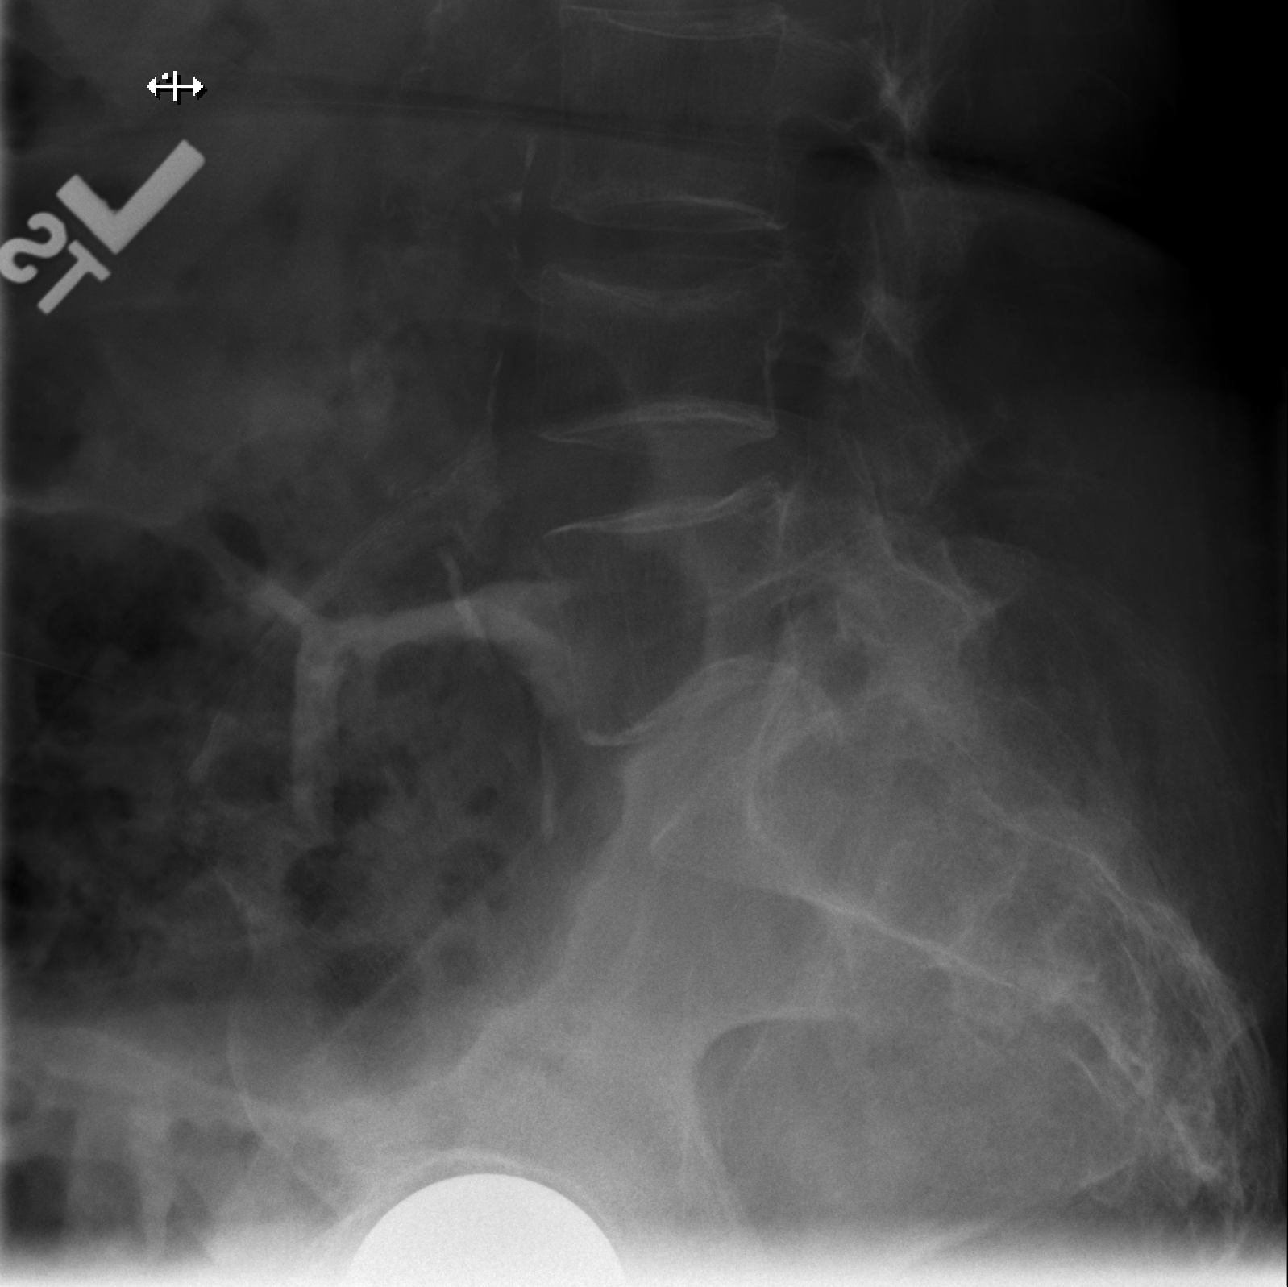

[4 of 4 positions shown; findings below may reference images not displayed]

PROCEDURE:     DXR - DXR LUMBAR SPINE AP AND LATERAL  - August 07, 2012  [DATE]

RESULT:     Diffuse osteopenia and degenerative change. Minimal compression
of L4 noted. This is a new finding from 05/10/2011. MRI can be performed for
further evaluation. Scoliosis concave right is present. Aortic
atherosclerotic vascular disease present.
IMPRESSION: Minimal compression L4 vertebral body, this is new from
05/10/2011. MRI can be obtained for further evaluation.

## 2013-07-21 ENCOUNTER — Emergency Department: Payer: Self-pay | Admitting: Emergency Medicine

## 2013-07-22 LAB — ETHANOL
ETHANOL %: 0.193 % — AB (ref 0.000–0.080)
Ethanol: 193 mg/dL

## 2013-07-25 DIAGNOSIS — M47816 Spondylosis without myelopathy or radiculopathy, lumbar region: Secondary | ICD-10-CM | POA: Insufficient documentation

## 2013-08-16 ENCOUNTER — Emergency Department: Payer: Self-pay | Admitting: Emergency Medicine

## 2013-08-16 LAB — URINALYSIS, COMPLETE
BILIRUBIN, UR: NEGATIVE
Glucose,UR: NEGATIVE mg/dL (ref 0–75)
KETONE: NEGATIVE
Leukocyte Esterase: NEGATIVE
Nitrite: NEGATIVE
PH: 5 (ref 4.5–8.0)
Protein: NEGATIVE
RBC,UR: 1 /HPF (ref 0–5)
SPECIFIC GRAVITY: 1.003 (ref 1.003–1.030)
Squamous Epithelial: NONE SEEN
WBC UR: 1 /HPF (ref 0–5)

## 2013-09-24 ENCOUNTER — Emergency Department: Payer: Self-pay | Admitting: Emergency Medicine

## 2013-09-30 DIAGNOSIS — S32000A Wedge compression fracture of unspecified lumbar vertebra, initial encounter for closed fracture: Secondary | ICD-10-CM | POA: Insufficient documentation

## 2013-10-03 ENCOUNTER — Emergency Department: Payer: Self-pay | Admitting: Emergency Medicine

## 2013-10-31 ENCOUNTER — Emergency Department: Payer: Self-pay | Admitting: Emergency Medicine

## 2013-10-31 LAB — URINALYSIS, COMPLETE
Bacteria: NONE SEEN
Bilirubin,UR: NEGATIVE
Blood: NEGATIVE
GLUCOSE, UR: NEGATIVE mg/dL (ref 0–75)
KETONE: NEGATIVE
Leukocyte Esterase: NEGATIVE
NITRITE: NEGATIVE
PH: 6 (ref 4.5–8.0)
Protein: NEGATIVE
RBC, UR: NONE SEEN /HPF (ref 0–5)
SPECIFIC GRAVITY: 1.002 (ref 1.003–1.030)
Squamous Epithelial: 1
Transitional Epi: <1
WBC UR: 1 /HPF (ref 0–5)

## 2013-11-02 ENCOUNTER — Emergency Department: Payer: Self-pay | Admitting: Emergency Medicine

## 2013-12-03 ENCOUNTER — Emergency Department: Payer: Self-pay | Admitting: Emergency Medicine

## 2013-12-03 LAB — BASIC METABOLIC PANEL
Anion Gap: 5 — ABNORMAL LOW (ref 7–16)
BUN: 9 mg/dL (ref 7–18)
CALCIUM: 7.9 mg/dL — AB (ref 8.5–10.1)
CO2: 26 mmol/L (ref 21–32)
Chloride: 98 mmol/L (ref 98–107)
Creatinine: 0.57 mg/dL — ABNORMAL LOW (ref 0.60–1.30)
EGFR (Non-African Amer.): >60
GLUCOSE: 88 mg/dL (ref 65–99)
Osmolality: 257 (ref 275–301)
Potassium: 3.5 mmol/L (ref 3.5–5.1)
Sodium: 129 mmol/L — ABNORMAL LOW (ref 136–145)

## 2013-12-03 LAB — CBC
HCT: 33.4 % — ABNORMAL LOW (ref 35.0–47.0)
HGB: 11.3 g/dL — ABNORMAL LOW (ref 12.0–16.0)
MCH: 30.6 pg (ref 26.0–34.0)
MCHC: 33.9 g/dL (ref 32.0–36.0)
MCV: 90 fL (ref 80–100)
PLATELETS: 162 10*3/uL (ref 150–440)
RBC: 3.71 10*6/uL — ABNORMAL LOW (ref 3.80–5.20)
RDW: 14.7 % — ABNORMAL HIGH (ref 11.5–14.5)
WBC: 6.7 10*3/uL (ref 3.6–11.0)

## 2013-12-03 LAB — ETHANOL: Ethanol: 270 mg/dL

## 2014-01-06 ENCOUNTER — Emergency Department: Payer: Self-pay | Admitting: Emergency Medicine

## 2014-01-31 ENCOUNTER — Emergency Department: Payer: Self-pay | Admitting: Emergency Medicine

## 2014-01-31 LAB — ETHANOL: ETHANOL LVL: 253 mg/dL

## 2014-05-31 ENCOUNTER — Emergency Department
Admit: 2014-05-31 | Disposition: A | Discharge status: Home / Self Care | Payer: Self-pay | Admitting: Emergency Medicine

## 2014-06-03 ENCOUNTER — Ambulatory Visit
Admit: 2014-06-03 | Disposition: A | Discharge status: Home / Self Care | Payer: Self-pay | Attending: Orthopedic Surgery | Admitting: Orthopedic Surgery

## 2014-06-16 NOTE — Op Note (Signed)
PATIENT NAME:  Alyssa Ortega, Alyssa Ortega MR#:  [id number] DATE OF BIRTH:  Sep 28, 1930  DATE OF PROCEDURE:  09/11/2011  PREOPERATIVE DIAGNOSIS: Right femoral neck fracture.   POSTOPERATIVE DIAGNOSIS: Right femoral neck fracture.  PROCEDURE: Right hip hemiarthroplasty.   SURGEON: Laurene Footman.   ANESTHESIA: General.   DESCRIPTION OF PROCEDURE: The patient was brought to the Operating Room and after adequate general anesthesia was obtained the patient was placed in the lateral decubitus position. The right hip was prepped and draped in the usual sterile fashion with a pegboard being used to hold her in this position. A posterior approach was utilized centered over the greater trochanter. After patient identification and time out procedures were completed, a posterior approach was made with the incision centered over the greater trochanter. Charnley retractor was placed after incision of the gluteal fascia and the hip and the leg internally rotated. The external rotators were detached from their attachment on the greater trochanter and the capsule exposed. The femoral neck was then visualized and a femoral neck cut made as the fracture was subcapital. After a femoral neck cut was made and femoral neck removed, the head was visualized and after a T-capsulotomy the head was removed and measured 46 mm. A 46 mm trial fit well. Next, a femoral elevator was placed along with a cobra retractor and box osteotome used followed by sequential broaching up to a #9 press-fit with appropriate anteversion. On measuring with the cement restrictor set, this measured a size 4. A size 4 cement restrictor was inserted. The canal was irrigated, dried, and packing with epinephrine was placed inside the canal to minimize bleeding. Cement was mixed and a #8 stem was inserted in the appropriate anteversion with excess cement removed. The acetabulum was checked. There was no debris. A +0 head followed by a 46 mm bipolar cup with a C taper  from the [facility] stem was applied. The hip was reduced after checking the acetabulum to make sure it was free of debris. The hip was stable through a range of motion. There was no capsular interposition. The wound was thoroughly irrigated and closed with a running #1 Vicryl for the gluteal fascia and IT band, 2-0 Vicryl subcutaneously, and skin staples. Xeroform, 4 x 4's, ABD, and tape were applied. Estimated blood loss was 500.   IMPLANTS: Size 8 C-taper ODC fracture hip stem with a 26 +0 C-taper head, and a 46 mm universal bipolar component.  ESTIMATED BLOOD LOSS: 500 mL.   COMPLICATIONS: None.   SPECIMENS: Femoral head.   CONDITION: To recovery room stable. After application of a sterile dressing and abduction pillow, the patient was transferred to her hospital bed. There were no complications.  ____________________________ Laurene Footman, MD mjm:slb D: 09/11/2011 22:24:03 ET T: 09/12/2011 11:36:26 ET JOB#: [id number]  cc: Laurene Footman, MD, <Dictator> Laurene Footman MD ELECTRONICALLY SIGNED 09/12/2011 15:34

## 2014-06-16 NOTE — Discharge Summary (Signed)
PATIENT NAME:  Alyssa Ortega, STOFKO MR#:  [id number] DATE OF BIRTH:  09-12-1930  DATE OF ADMISSION:  09/11/2011 DATE OF DISCHARGE:  09/15/2011  ADMITTING DIAGNOSIS: Right hip femoral neck fracture.   DISCHARGE DIAGNOSES:  1. Right hip femoral neck fracture status post hemiarthroplasty. 2. Mild thrombocytopenia attributed to alcohol abuse. 3. Acute delirium tremens. 4. Hypertension. 5. Hyponatremia.  6. Urinary retention.  ATTENDING: Dr. Hessie Knows, [facility]   CONSULTING PHYSICIANS:  1. Dr. Fritzi Mandes  2. Dr. Lenore Manner  PROCEDURES: On 07/15 the patient underwent right hip hemiarthroplasty by Dr. Hessie Knows.   ANESTHESIA: General.   ESTIMATED BLOOD LOSS: 500 mL.   SPECIMEN SENT: Femoral head.   OPERATIVE FINDINGS: Subcapital fracture with benign appearance.   IMPLANTS: By [employer], bipolar head.  DRAINS:  No drains were placed.   COMPLICATIONS: No complications occurred.   HISTORY: The patient is a 81 year old who tripped over a carpet early in the morning on 07/15. She suffered a fall and was brought to the ER and found to have a femoral neck fracture on x-ray. She was admitted for treatment of this.   PAST MEDICAL HISTORY:  1. Prior left femoral neck fracture treated with endoprosthesis with good result. Subsequently, she did need some repeat surgery for bursitis.  2. Alcohol abuse.  3. Hypertension.  4. Hyperlipidemia.  5. Anemia.  6. Malnutrition.  7. Osteoporosis.  8. Reflux.  9. Suicidal thoughts.  DRUG ALLERGIES AND ADVERSE REACTIONS: None.   PHYSICAL EXAMINATION:  HEART: Regular rate and rhythm. No murmur. LUNGS: Clear to auscultation.  EXTREMITIES: Remarkable for a shortened and externally rotated right lower extremity. She has a trace dorsalis pedis pulse. She does have intact sensation and is able to flex and extend the toes.   HOSPITAL COURSE: The patient was admitted because of the aforementioned fracture on the evening of the fifteenth.  Dr. Rudene Christians was able to perform right hip hemiarthroplasty on the patient  without complication. The patient was transferred to PAC-U and then the orthopedic floor in stable condition. Medicine had been consulted to follow. CIWA protocol had been ordered because of the patient's history of alcohol abuse. Her serum alcohol level was 0.249. Medicine would continue to follow her. Postoperatively Dr. Rudene Christians elected to not put her on any oral anticoagulants because of her history of recent ulcers. She did have some confusion postoperatively though this would diminish.  The patient had not been eating too much while here. She did work with physical therapy and was able to ambulate some. Foley catheter did have to be reinserted because of urinary retention. Bladder training was ordered towards the end of her stay here, but she would end up being discharged with the Foley catheter. She did pass some stool on the nineteenth.   CONDITION AT DISCHARGE: Stable.   DISPOSITION: [facility] in [place].   DISCHARGE MEDICATIONS:  1. Albuterol/ipratropium SVN 3 mL twice a day as needed for shortness of breath or wheezing.  2. Multivitamin one daily.  3. Antacid DS suspension as needed for heartburn, 30 mL every six hours as needed.  4. Dulcolax 10 mg rectal daily as needed for constipation.  5. Milk of magnesia 30 mL oral twice a day as needed for constipation.  6. Senokot-S 1 tablet oral twice a day.  7. Oxycodone 5 to 10 mg every four hours as needed for pain.  8. Tylenol 500 to 1000 mg every six hours as needed for pain or fever.  9. Nexium 40 mg oral  daily.  10. Lisinopril 40 mg oral daily.  11. Senokot 1 tablet oral twice a day as needed for constipation.   DISCHARGE INSTRUCTIONS AND FOLLOWUP:  1. Universal skin precautions. 2. Apply exit alarm.  3. Posterior hip precautions are in place. Please keep a pillow between her legs when she is in bed. Knee-high TED hose should be worn daily six weeks status post  surgery.  4. Encourage incentive spirometry while awake.  5. Physical therapy and occupational therapy consults. She is weight-bearing as tolerated on the right leg but should not flex her hip more than 70 degrees, should not significantly adduct or internally rotate it.  6. Soapsuds enema as needed.  7. Ensure twice a day.  8. Diet is mechanical soft.  9. Bladder training in order to get her off the Foley catheter.  10. She does have a follow-up appointment at [place] with Dorthula Matas, PA-C on 09/27/2011 at 9:00 a.m. for hip staple removal.  11. Please keep her incision clean and dry and change her right hip dressing as needed.  ____________________________ Jerrel Ivory Tysean [place], [place] jrp:bjt D: 09/15/2011 14:06:06 ET T: 09/15/2011 14:37:36 ET JOB#: [id number]  cc: Jerrel Ivory. Charlett Nose, [place], <Dictator> Hayden PA ELECTRONICALLY SIGNED 09/17/2011 19:08

## 2014-06-19 NOTE — Consult Note (Signed)
PATIENT NAME:  Alyssa Ortega, Alyssa Ortega MR#:  [id number] DATE OF BIRTH:  04/07/1930  DATE OF CONSULTATION:  09/08/2012  CONSULTING PHYSICIAN:  Claud Kelp, MD  HISTORY OF PRESENT ILLNESS: Ms. Woolf is an 81 year old female with a known L4 compression fracture who was admitted with abdominal pain and urinary retention. Orthopedics was consulted regarding the status of her compression fracture. Of note, the patient, I believe, is being followed by [facility], particularly Dr. Rudene Christians and undergoing evaluation for possible kyphoplasty procedure.   PHYSICAL EXAMINATION: The patient is supine in bed in full-length bilateral SCDs, somewhat limiting her neuro exam. However, she is distally neurovascularly intact with symmetric strength of both ankle dorsiflexion, plantar flexion as well as EHL function bilaterally. She has intact sensation to both lower extremities. She does have some mild tenderness to palpation along her L-spine, but predominant complaint is more of abdominal pain.    RADIOGRAPHS: Reviewed from last month demonstrated an L4 compression fracture with no significant junctional kyphosis and no involvement of the posterior column and preservation of vertebral body height at that time. She is currently awaiting MRI to further delineate vertebral body involvement.   ASSESSMENT: An 81 year old female known to the [facility] with an L4 compression fracture.   PLAN: She will as previously scheduled undergo her L-spine MRI prior to consultation with Dr. Rudene Christians next week for possibility of treating with a TLSO brace versus a kyphoplasty procedure.  ____________________________ Claud Kelp, MD tte:cb D: 09/08/2012 12:19:03 ET T: 09/08/2012 16:02:36 ET JOB#: [id number]  cc: Claud Kelp, MD, <Dictator> Claud Kelp MD ELECTRONICALLY SIGNED 09/08/2012 18:15

## 2014-06-19 NOTE — Discharge Summary (Signed)
PATIENT NAME:  Alyssa Ortega, Alyssa Ortega MR#:  [id number] DATE OF BIRTH:  10-30-1930  DATE OF ADMISSION:  09/07/2012 DATE OF DISCHARGE:  09/12/2012  PRIMARY CARE PHYSICIAN: Larene Beach, M.D.   ORTHOPEDIC: Dr. Rudene Christians, M.D.    FINAL DIAGNOSES: 1. Intractable back pain with compression fractures and kyphoplasty.  2. History of alcohol abuse.  3. Gastroesophageal reflux disease.  4. Hypertension.  5. Hypokalemia.   MEDICATIONS ON DISCHARGE: Include: 1. Tylenol 650 mg every 4 hours as needed for pain or temperature.  2. Norco 325/5 one tablet every 6 hours as needed for pain.  3. Lisinopril 40 mg daily.  4. Calcitonin nasal spray, 1 spray each nostril once a day, alternate nostrils please.  5. Colace 100 mg twice a day as needed for constipation.  6. Nexium 40 mg daily.  7. Multivitamin 5 mL once a day.   FOLLOW UP:  With doctor at rehab in 1 to 2 days and Dr. Rudene Christians 10 days.   REFERRAL: Physical therapy.   DIET: Low-sodium diet, regular consistency.   HOSPITAL COURSE: The patient was admitted 09/07/2012 with intractable back pain. The patient was brought in with compression fracture. The patient had a consultation by Dr. Daine Gip who gave over care to Dr. Rudene Christians.  Dr. Rudene Christians performed a vertebral biopsy of vertebral bodies partial correction of L2 and L4 deformities with kyphoplasty. Please see operative report for details.   LABORATORY AND RADIOLOGICAL DATA DURING THE HOSPITAL COURSE: Included a uric acid of 3.9. Ethanol level less than 3. Lipase 421, glucose 108, BUN 5. Creatinine 0.73 sodium 125, potassium 3.8, chloride 87, CO2 of 28, calcium 9.1, alkaline phosphatase 204, ALT 22, AST 34. White blood cell count 6.8, hemoglobin and hematocrit 13.1 and 37.6, platelet count 184. Urinalysis negative. CT scan of the abdomen and pelvis; findings concerning for urinary retention of the bladder, mild hydroureter, majority of the bladder not seen because of bilateral hip prosthesis.  Osmolarity of blood 271.   Sodium improved on 07/13 to 134. MRI of the lumbar spine showed acute L2 vertebral body compression fracture, marrow edema, 50% central height loss, 6 mm retropulsion of the superior posterior margin L2 vertebral body. Acute to subacute L4 vertebral body compression fracture, marrow edema, 80% height loss acute. Compression fracture, L3 vertebral body. A 6 mm retropulsion of the superior posterior margin of the L4 vertebral body which impression was on the ventral thecal sac. Potassium was down to 3.0 on 07/16 and up to 3.5 on the 07/17.   HOSPITAL COURSE PER PROBLEM LIST:  1. For the intractable back pain with compression fractures status post kyphoplasty, the patient will end up going to rehab. The patient was tried initially conservatively and then Dr. Rudene Christians performed the procedure on 09/10/2012. The patient will be given pain control with Tylenol and Norco.  2. History of alcohol abuse. Sodium was a little low on presentation. No signs of withdrawal during the hospitalization. Sodium improved to normal range. The patient will be given multivitamin upon discharge.  3. Gastroesophageal reflux disease, on Nexium.  4. Hypertension on lisinopril.  5. Hypokalemia. This was replaced during the hospitalization.  Can recheck potassium on Monday, because the patient is on lisinopril, I do not think replacement of potassium will be a big deal and we will just go with diet alone.   TIME SPENT ON DISCHARGE: 40 minutes.   Of note, the patient's Foley was discontinued.  We want to make sure the patient urinates prior to discharge out of the hospital.  ____________________________ Tana Conch. Leslye Peer, MD rjw:rw D: 09/12/2012 14:28:06 ET T: 09/12/2012 15:12:02 ET JOB#: [id number]  cc: Tana Conch. Leslye Peer, MD, <Dictator> Laurene Footman, MD Arlis Porta., MD Outside Physician Marisue Brooklyn MD ELECTRONICALLY SIGNED 09/13/2012 17:20

## 2014-06-19 NOTE — H&P (Signed)
PATIENT NAME:  Alyssa Ortega, Alyssa Ortega MR#:  [id number] DATE OF BIRTH:  Oct 08, 1930  DATE OF ADMISSION:  09/07/2012  REFERRING PHYSICIAN:  Dr. Jasmine December  PRIMARY CARE PHYSICIAN:  Dr. Larene Beach   CHIEF COMPLAINT:  Severe back pain, unable to walk.   HISTORY OF PRESENT ILLNESS:  Alyssa Ortega is a very nice 81 year old female with history of hypertension, hyperlipidemia, chronic alcohol abuse, thrombocytopenia, osteoporosis, and gastroesophageal reflux. She has a history of left hip fracture and right hip fracture in the past from multiple falls. Apparently in May, she had at least 6 falls, and she had a broken ankle.  The patient has been very weak, and she has problems ambulating. The patient has not been able to get up, and the last time that she actually ambulated by herself was 2 months ago. Prior to that, she had several falls, and one of them apparently made her suffer a lumbar compression fracture.   The patient comes today in company of her daughter because she had an MRI ordered to be done today to evaluate her compression fracture. Unfortunately, she was not able to get it done due to patient not able to get up and walk around. She had minimal compression of L4 vertebral body on a lumbar x-ray done on 08/07/2012. The patient, whenever she showed up over here, she was complaining also of abdominal pain. Every time that she moves, her back hurts and her abdomen hurt. She had a urinary retention of 1200 after Foley was placed. That was the amount that was  removed. The patient denies any fever, chest pain, shortness of breath, other medical problems. The patient is admitted because of lack of ambulation, unable to get around, hyponatremia, and possible evaluation for kyphoplasty.   REVIEW OF SYSTEMS:  A 12-system review of systems was done. CONSTITUTIONAL:  Denies any fever, fatigue, weakness, weight loss or weight gain.  EYES:  No blurry vision, Alyssa Ortega vision, glaucoma.  EARS, NOSE, THROAT:   No  tinnitus, ear pain, snoring, difficulty swallowing.  RESPIRATIONS:  No cough, wheezing, hemoptysis, COPD.   CARDIOVASCULAR:  No chest pain, orthopnea, edema or syncope.  GASTROINTESTINAL: No nausea, vomiting, abdominal pain, constipation or diarrhea, Abdominal pain happened today, and it was due to the urinary distention. After the Foley was placed, patient had full relief of the abdominal pain. The abdominal pain was located in the suprapubic area, intensity of 6 out of 10. No radiation, felt pressure-like, worse with movement, nothing makes it better.  GYNECOLOGIC:  No breast masses.  GENITOURINARY:  No dysuria or hematuria. Positive urinary retention.  ENDOCRINE:  No polyuria, polydipsia, polyphagia, cold or heat intolerance.  HEMATOLOGIC AND LYMPHATIC:  No easy bruising or bleeding. No swollen glands.  MUSCULOSKELETAL:  No significant swelling or gout. The patient has back pain, which is severe.  NEUROLOGIC: No numbness, weakness, CVAs or TIAs. No dementia apparently, she has never been diagnosed with that, or memory loss.  PSYCHIATRIC:  No significant agitation or depression.   PAST MEDICAL HISTORY: 1.  Hypertension.  2.  Hyperlipidemia.  3.  Alcohol abuse.  4.  Thrombocytopenia due to alcohol abuse.  5.  Chronic hyponatremia.  6.  Osteoporosis.  7.  Gastroesophageal reflux.  8.  Multiple falls.  9.  Imbalance.   10.  Bilateral hip fractures.  11.  L4 lumbar fracture recently.   PAST SURGICAL HISTORY: 1.  Bilateral hip replacements.  2.  Left eye surgery in childhood due to a lesion on her eyes. 3.  Right eye cataract surgery.  4.  Appendectomy at the age of 84.  60.  Hysterectomy at the age of 23.   MEDICATIONS:  Nexium 40 mg once daily, lisinopril 40 mg once daily, tramadol as needed for pain, clindamycin recently prescribed for cellulitis of the lower extremity, which has cleared up.   FAMILY HISTORY:  Positive for her mother dying of old age at the age of 65, and she  suffered from Alzheimer's dementia. Her father died also from old age without any illness.    SOCIAL HISTORY:  She used to smoke, quit 30 years ago. She drinks about 2 to 3 beers a day, she quit a month ago. She lives with her son. She is a widow. One of her sons just recently died last month from pancreatic cancer.   ALLERGIES:  No known drug allergies.   PHYSICAL EXAMINATION: VITAL SIGNS: Blood pressure 127/75, pulse 83, respirations 18, temperature 97.8, pulse ox 93%.  GENERAL:  The patient is alert. She is oriented x 3. No respiratory distress. Positive distress due to pain whenever she moves. Even if she moves  a very small amount to position herself, she is in severe distress.  HEENT:  Pupils are equal and reactive. Extraocular movements are intact. Mucosa are moist. Anicteric sclerae. Pink conjunctivae. No oral lesions. No oropharyngeal exudates. No thrush.  NECK:  Supple. No JVD. No thyromegaly. No adenopathy. No carotid bruits. No rigidity. No masses.  CARDIOVASCULAR: Regular rate and rhythm. No murmurs, rubs or gallops. No displacement of PMI. No tenderness to palpation anterior chest wall.  LUNGS:  Clear, without any wheezing or crepitus. No use of accessory muscles.  ABDOMEN: Soft, nontender, nondistended. No hepatosplenomegaly. No masses. The patient was very tender earlier before the Foley catheter was placed.  GENITOURINARY:  Foley catheter in place. No external lesions.  EXTREMITIES:  No significant edema, cyanosis or clubbing. Pulses +2. Capillary refill less than 3 on the vascular side. Sensation is normal.  MUSCULOSKELETAL:  Positive severe tenderness to palpation of spinal processes at the level of lumbar spine around L4-L5.   NEUROLOGIC:  Cranial nerves II through XII intact. Deep tendon reflexes +2 in both lower extremities equally on both sides. Sensation is normal distally in lower extremities. Her strength seems to be equal in all 4 extremities, 4/5, just generalized  weakness. Upper extremities are equal strength 5/5.  RECTAL:  The patient does not have any significant fecal incontinence. Rectal exam is deferred at this moment, at request of the patient.  PSYCHIATRIC:  Mood is normal, without any signs of depression or agitation, but patient gets really upset whenever she moves due to the pain.  LYMPHATIC:  Negative for lymphadenopathy in neck or supraclavicular areas.  SKIN:  Dry skin on lower extremities and arms. Otherwise no significant lesions or rashes. The patient states that she had cellulitis of the lower extremity. At this moment, I have not seen any signs of it.   RESULTS:  Lumbar spine a month ago with compression fracture at L4. CT scan of the abdomen showed urinary retention of the bladder, mild hydroureter, hydronephrosis suggested without definitive sign of obstruction, ureterovesical junction and a large majority of the bladder is not seen because of hip prosthesis, minimal patchy areas of atelectasis or fibrosis of the lungs.   Sodium is 125, glucose 108, BUN 5, creatinine 0.73, which is above her baseline which is 0.4. Chloride 97, calcium 9.1, lipase 421. Ethanol level is negative. Albumin 3.3, alkaline phosphatase  204.  White count 6.8, hemoglobin 13, platelets 184. Urinalysis:  No signs of urinary infection, negative nitrites, negative leukocyte esterase, no white blood cells.   EKG:  Normal sinus rhythm.   ASSESSMENT AND PLAN: An 81 year old female with history of hypertension, hyperlipidemia, alcoholic thrombocytopenia, osteoporosis and GERD, comes with severe pain, unable to ambulate.  1.  Compression fracture of  L4. At this moment, we are going to obtain an MRI without contrast to evaluate the possibility of kyphoplasty. We are going to consult Orthopedics, Dr. Rudene Christians, to help [place] with the case and evaluating it. We are also going to start patient on calcitonin to see if we can decrease the pain, as it has been proven to relieve some of the  pain of compression fractures. The patient will be a good candidate for surgery, if we are able to fix her sodium.   2.  Alcoholism. At this moment, the patient has been sober for over a month. No DTs, no need for CIWA protocol unless things change, but the patient and family are very confident that she has not been drinking.   3.  Hypertension. Seems to be stable. The patient is taking lisinopril. Continue lisinopril.   4. Urinary retention. The patient has a Foley catheter. Leave the Foley catheter for 1 to 2 days, then remove and give her a trial of voiding. We can add Flomax before the catheter is removed.   5.  Hyperlipidemia. The patient is not taking any medications at this moment.   6.  Alcohol abuse. As mentioned above, she is no longer drinking.   7.  Thrombocytopenia. At this moment, her platelets are normal, and it was likely due to alcohol abuse.   8.  Deep vein thrombosis prophylaxis with heparin.   9.  Gastrointestinal prophylaxis with Protonix.   10.  For her sodium, we are going to do urine sodium, urine osmolality, give her IV fluids at 90 mL an hour. Monitor levels If it seems to be SIADH due to the pain, we are going to actually fluid restrict. At this moment, she looks like she is slightly dehydrated.   11.  The patient is a FULL CODE.   I spent about 45 minutes with this patient.   ____________________________ [place] Sink, MD rsg:mr D: 09/07/2012 21:59:45 ET T: 09/07/2012 22:56:31 ET JOB#: [id number]  cc: [place] Sink, MD, <Dictator> Braylin Xu [place] Brown MD ELECTRONICALLY SIGNED 09/08/2012 13:32

## 2014-06-19 NOTE — Op Note (Signed)
PATIENT NAME:  Alyssa Ortega, Alyssa Ortega MR#:  [id number] DATE OF BIRTH:  1930-07-04  DATE OF PROCEDURE:  11/20/2012  PREOPERATIVE DIAGNOSIS: L5 compression fracture.   POSTOPERATIVE DIAGNOSIS: L5 compression fracture.   PROCEDURE: Biopsy and kyphoplasty, L5.   ANESTHESIA: MAC.   SURGEON: Laurene Footman, MD.   DESCRIPTION OF PROCEDURE: The patient was brought to the operating room and after adequate sedation was given, the patient was positioned prone with C-arm brought in and both AP and lateral projections. When good visualization of the L5 vertebral body was obtained in both projections, the timeout procedure was completed. The skin was prepped with alcohol and 5 mL of 1% Xylocaine was infiltrated subcutaneously for initial local anesthetic. The back was then prepped and draped in the usual sterile manner and repeat timeout procedure completed.   A spinal needle was then placed through the previously anesthetized area down to the pedicle and a combination of 1% Xylocaine and 0.5% Sensorcaine with epinephrine was infiltrated along the tract from the pedicle to the skin. This was repeated on the left side.   A stab incision was made on the right side and a trocar advanced down to the pedicle and through a transpedicular approach. The vertebral body was entered, checking on AP and lateral images to get this in the appropriate location. Biopsy was obtained. The bone was quite soft. Drilling was carried out and then the identical procedure was carried out on the left side except no biopsy obtained.   The balloons were inflated to 3 mL on each side, which gave correction of the inferior endplate compression deformity. The balloon was let down on the right and bone cement filled with 3 mL filling nicely. The balloon on the left let down and then again filling this side. There is good interdigitation and maintenance of correction of the deformity.   After the cement had set, trocars were removed, permanent  C-arm views were obtained. The wounds were then closed with Dermabond and covered with Band-Aids. The patient was then sent to the recovery room in stable condition.   ESTIMATED BLOOD LOSS: Minimal.   COMPLICATIONS: None.   SPECIMEN: L5 vertebral body biopsy.   CONDITION: To recovery room, stable.    ____________________________ Laurene Footman, MD mjm:np D: 11/20/2012 18:39:24 ET T: 11/20/2012 22:40:43 ET JOB#: [id number]  cc: Laurene Footman, MD, <Dictator> Laurene Footman MD ELECTRONICALLY SIGNED 11/21/2012 7:30

## 2014-06-19 NOTE — Consult Note (Signed)
Brief Consult Note: Diagnosis: L4 compression fracture.   Patient was seen by consultant.   Consult note dictated.   Recommend further assessment or treatment.   Discussed with Attending MD.   Comments: Patient with known L4 compression fracture who was admitted with abdominal pain/urinary retention. She is awatingt MRI prior to consultation with Dr. Rudene Christians for possible kyphoplasty.  Electronic Signatures: Claud Kelp (MD)  (Signed 13-Jul-14 12:16)  Authored: Brief Consult Note   Last Updated: 13-Jul-14 12:16 by Claud Kelp (MD)

## 2014-06-19 NOTE — Op Note (Signed)
PATIENT NAME:  Alyssa Ortega, Alyssa Ortega MR#:  [id number] DATE OF BIRTH:  September 21, 1930  DATE OF PROCEDURE:  09/10/2012  PREOPERATIVE DIAGNOSIS: Compression fractures L2-L3 and L4.   POSTOPERATIVE DIAGNOSIS: Compression fractures L2-L3 and L4.   PROCEDURE: Biopsy and kyphoplasty L2-L3 and L4.   ANESTHESIA:  General.  SURGEON: Laurene Footman.   DESCRIPTION OF PROCEDURE: The patient was brought to the Operating Room and after adequate MAC anesthesia was obtained, the patient was placed prone, and C-arm was brought in. During the procedure she did need LMA placed because of difficulty with breathing and converted to a general anesthetic. After the back was prepped with alcohol and C-arm views were obtained, a timeout procedure was carried out and local anesthetic was infiltrated into the left and right sides at L2-L3 and L4 for subsequent further injection or incision. After prepping and draping the back in the normal sterile fashion, again, appropriate identification and timeout procedures were completed. Local anesthetic was infiltrated then with the spinal needle down to the pedicle and the right side at L2 and L4 and the left side at L3 in hopes of just having to go on one side for the procedure, which was in fact obtainable.  A stab incision was made at L2 and the trocar advanced to the appropriate location. Biopsy obtained and then drill placed in between the superior and inferior endplates.  A balloon was advanced and inflated with good correction of significant kyphosis and compression of the superior endplate. The L4 level was then addressed in a similar fashion.  It was even narrower between the superior and inferior endplates and this was inflated, approximately 2.5 mL, again crossing the midline so the left side was not opened. At L3, the procedure was carried out and it was not as difficult secondary to the fact that there is not the large compression deformity, just subacute fracture.  A biopsy at this  level was also obtained. Bone was quite soft at every level, but good bone biopsies were obtained. The balloon was inflated to 3.5 mL at L3.  With all balloons inflated, cement was mixed and they were sequentially filled going from superior to inferior with good fill of all levels without extravasation. After all levels had been completed and the cement was set, trocars were removed and permanent C-arm views obtained. The wounds were then closed with Dermabond and covered with a Band-Aid. The patient was sent to the recovery room in stable condition.   ESTIMATED BLOOD LOSS: Approximately 50 mL.   SPECIMEN: Vertebral bone biopsies of the vertebral body L2, L3 and L4.   COMPLICATIONS: None.   CONDITION: To recovery room stable.     ____________________________ Laurene Footman, MD mjm:rw D: 09/10/2012 16:32:40 ET T: 09/10/2012 18:40:26 ET JOB#: [id number]  cc: Laurene Footman, MD, <Dictator> Laurene Footman MD ELECTRONICALLY SIGNED 09/11/2012 7:01

## 2014-06-20 NOTE — Discharge Summary (Signed)
PATIENT NAME:  Alyssa Ortega, Alyssa Ortega MR#:  [id number] DATE OF BIRTH:  November 14, 1930  DATE OF ADMISSION:  04/22/2013 DATE OF DISCHARGE:  04/25/2013  ADMITTING DIAGNOSIS: Prior bipolar hip endoprosthesis with osteoarthritis.   DISCHARGE DIAGNOSIS: Prior bipolar hip endoprosthesis with osteoarthritis.   PROCEDURE: Conversion of prior partial hip to total hip revision.   ANESTHESIA: Spinal.   SURGEON: Laurene Footman, MD.   ASSISTANT: Rachelle Hora, PA-C.   ESTIMATED BLOOD LOSS: 500 mL.  COMPLICATIONS: None.   SPECIMEN: None. The prior implant was discarded after picture was taken. There was no mechanical failure to it.   IMPLANT: Stryker C-taper LFIT head size 28, +5 mm DM liner with a 48 mm cup and Versafit cup DM 48 mm from [place].   HISTORY: The patient is an 81 year old female who has had significant problems with osteoporosis. She suffered bilateral hip fractures as well as multiple level kyphoplasties for compression fractures. In the past month, she has been having significant pain in the right buttocks and groin, pain with weight bearing, as well as pain around the hip. She has had a prior hip hemiarthroplasty 1 to 1-1/2 years ago with cemented stem. 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 She is now having debilitating pain that is keeping her from walking. Her hip surgery was 09/11/2011.   PHYSICAL EXAMINATION: LUNGS: Clear.  HEART: Regular rate and rhythm.  HEENT: Edentulous.  NECK: Nontender.  MUSCULOSKELETAL: On exam, she had severe pain with hip internal rotation of the right and mild pain on the left. She has a very antalgic gait with this and this is new compared to her prior visits with her compression fractures.   HOSPITAL COURSE: The patient was admitted to the hospital on April 22, 2013. She had surgery that same day and was brought to the orthopedic floor from the PAC-U in stable condition. On postop day 1, the patient was doing well, her pain was controlled, and vital signs are stable.  She did have a drop in hemoglobin from 13.7 to 9.1. On postop day 2, hemoglobin dropped down to 8.2. Vital signs were stable. She is doing well. She is having good progress with physical therapy. On postop day 3, hemoglobin was down to 8.2. Vital signs were stable. She was not having any chest pain, shortness of breath, or dizziness with physical therapy. Overall she felt well. She was able to have a bowel movement. Pain was controlled and she was ready for discharge to rehab facility for physical therapy and occupational therapy.   CONDITION AT DISCHARGE: Stable.   DISCHARGE INSTRUCTIONS: The patient may gradually increase weight-bearing on the effected extremity. Knee-high TED hose on both legs and remove 1 hour per 8 hour shift. Elevate the heels off the bed. Use incentive spirometry every hour while awake. Encourage cough and deep breathing. The patient may resume a regular diet as tolerated. Do not get the dressing or bandage wet or dirty. Call [facility] orthopedics if the dressing gets water under it. Leave the dressing on. Call [facility] orthopedics if any of the following occur: Bright red bleeding from the incision or wound, fever above 101.5 degrees, redness, swelling, or drainage at the incision. Call [facility] orthopedics if you experience any increased leg pain, numbness or weakness in your legs or bowel or bladder symptoms. Physical therapy and occupational therapy have been ordered for the patient. Call [facility] orthopedics for follow-up appointment in 2 weeks.   DISCHARGE MEDICATIONS: Please see discharge medications from discharge instructions.  ____________________________  Ronney Asters, PA-C tcg:sb D: 04/25/2013 14:48:33 ET T: 04/25/2013 15:10:07 ET JOB#: [id number]  cc: T. Rachelle Hora, PA-C, <Dictator> Duanne Guess [place] ELECTRONICALLY SIGNED 05/16/2013 7:42

## 2014-06-20 NOTE — Op Note (Signed)
PATIENT NAME:  Alyssa Ortega, Alyssa Ortega MR#:  [id number] DATE OF BIRTH:  Mar 25, 1930  DATE OF PROCEDURE:  04/22/2013  PREOPERATIVE DIAGNOSIS: Prior bipolar hip endoprosthesis with osteoarthritis.   POSTOPERATIVE DIAGNOSIS: Prior bipolar hip endoprosthesis with osteoarthritis.   PROCEDURE: Conversion of prior partial hip to total hip revision.   ANESTHESIA: Spinal.   SURGEON: Laurene Footman, MD  ASSISTANT: Rachelle Hora, PA-C   DESCRIPTION OF PROCEDURE: The patient was brought to the operating room, and after adequate anesthesia was obtained, the patient was placed supine on the operative table with the foot in the Medacta attachment, left leg on a well-padded table. The C-arm was brought in and an initial template view of the hip obtained. The hip was then prepped and draped in the usual sterile fashion, and appropriate patient identification and timeout procedure were completed. Direct anterior approach was made with incision over the TFL in an oblique fashion. The tensor fascia was incised and the muscle retracted laterally. The deep fascia was incised and the lateral femoral circumflex vessels ligated. The capsule was opened, and there was moderate synovitis present in the joint. The hip could be dislocated, and the prior femoral ball was removed. Inspection of the cup revealed erosion with exposed bone. Pubofemoral and ischiofemoral releases were carried out to allow for internal rotation of the leg and getting the femoral neck behind the acetabulum. Reaming was carried out to 48 mm, and a 48 mm trial fit well. The 48 mm insert, Versafit cup DM was then impacted into place. Trials were placed, and the final components were chosen, with the stem being a Stryker C-Taper +5 28 mm head was chosen to go with a 48 mm DM liner. Those were assembled on the back table, impacted, and the hip appeared stable, with adequate position of acetabular component. The hip was stable to 90 degree external rotation test, and soft  tissue tension and x-rays appeared to show restoration of length. The wound was thoroughly irrigated. The deep fascia repaired using a heavy Quill after a local anesthetic of 0.25% Sensorcaine with epinephrine was infiltrated in the periarticular tissue. A subcutaneous drain was placed, followed by subcutaneous 2-0 Quill, skin staples, Xeroform, 4 x 4's, ABD and tape. The patient was sent to recovery room in stable condition.   ESTIMATED BLOOD LOSS: 500 mL.   COMPLICATIONS: None.   SPECIMEN: None. The prior implant was discarded after picture taken. There was no mechanical failure to it.   IMPLANT: Stryker C-Taper LFIT head size 28 +5 mm, DM liner for a 48 mm cup and a Versafit cup DM 48 mm from [place].   ____________________________ Laurene Footman, MD mjm:lb D: 04/23/2013 06:40:21 ET T: 04/23/2013 07:24:16 ET JOB#: [id number]  cc: Laurene Footman, MD, <Dictator> Laurene Footman MD ELECTRONICALLY SIGNED 04/23/2013 9:39

## 2014-06-21 ENCOUNTER — Emergency Department: Admit: 2014-06-21 | Disposition: A | Discharge status: Home / Self Care | Payer: Self-pay | Admitting: Student

## 2014-06-21 NOTE — Discharge Summary (Signed)
PATIENT NAME:  Alyssa Ortega, Alyssa Ortega MR#:  [id number] DATE OF BIRTH:  14-Aug-1930  DATE OF ADMISSION:  02/27/2011 DATE OF DISCHARGE:  02/28/2011  DISCHARGE DIAGNOSES:  1. Acute alcoholic pancreatitis, now resolved.  2. Elevated cardiac enzymes likely due to supply/demand ischemia. 3. Alcoholism, counseled. Not suicidal per psychiatry and she is cleared to go home by psychiatric doctor. 4. Thrombocytopenia likely due to chronic alcoholism.   SECONDARY DIAGNOSES:  1. Hypertension.  2. Hyperlipidemia. 3. Alcohol abuse.  4. Anemia. 5. Thrombocytopenia.  6. Malnutrition.  7. Osteoporosis.  8. Gastroesophageal reflux disease.   CONSULTATIONS:  1. Psychiatry, Dr. Bary Leriche. 2. GI, Dr. Gustavo Lah.   LABORATORY, DIAGNOSTIC AND RADIOLOGICAL DATA:  CT scan of the head without contrast 02/27/2011 showed no acute abnormalities.   Chest x-ray on 02/27/2011 showed no acute cardiopulmonary disease.   Abdominal ultrasound on 02/28/2011 showed normal findings. No acute pathology.   Urinalysis on 02/27/2011 was negative.   Urine culture was negative from 02/27/2011.   Blood cultures x2 were negative on admission.   Serum Tylenol level less than 2. Salicylate level less than 1.7.   HISTORY AND SHORT HOSPITAL COURSE: Patient is a 81 year old female with above-mentioned medical problems was admitted for alcoholic intoxication, possible suicidal ideation and acute pancreatitis. She was initially made n.p.o., subsequently started on clear liquid and full liquid diet, tolerated well. She also had elevated lipase which normalized. Subsequently was evaluated by GI, Dr. Gustavo Lah, who recommended abdominal ultrasound which was performed, essentially within normal limits. Her alcohol level was elevated with a value of 0.288 on admission. Her lipase was 605 on admission which normalized today with a value of 250. She had borderline elevated cardiac enzymes which was thought to be due to supply/demand ischemia. She had  a normal TSH. There was a concern for possible suicidal ideation for which psychiatry consult was obtained. Dr. Bary Leriche evaluated her, did not find her to have any suicidal thoughts while in the hospital. She recommended outpatient alcohol detox if she is interested. She did not recommend any inpatient behavioral medicine and recommended discharge psychiatry wise. Patient was feeling much better and was discharged home in stable condition.   PHYSICAL EXAMINATION: VITAL SIGNS: On the date of discharge her vital signs were as follows: Temperature 98.6, heart rate 82 per minute, respirations 18 per minute, blood pressure 128/48 mmHg. She is saturating 97% on room air. Pertinent Physical Examination: CARDIOVASCULAR: S1, S2 normal. No murmurs, rubs or gallop. LUNGS: Clear to auscultation bilaterally. No wheezes, rales, rhonchi, crepitation. ABDOMEN: Soft, benign. NEUROLOGIC: Nonfocal examination. All other physical examination remained at the baseline.   DISCHARGE MEDICATIONS:  1. Lisinopril 40 mg p.o. daily.  2. Simvastatin 20 mg p.o. daily. 3. Calcium with vitamin D 1 tablet p.o. b.i.d.   DISCHARGE DIET: Low sodium.   DISCHARGE ACTIVITY: As tolerated.   DISCHARGE INSTRUCTIONS AND FOLLOW UP: Patient was instructed to follow up with her primary care physician, Dr. Deborra Medina, in 1 to 2 weeks.    TOTAL TIME DISCHARGING THIS PATIENT: 45 minutes.   ____________________________ Lucina Mellow. Manuella Ghazi, MD vss:cms D: 02/28/2011 19:14:05 ET T: 03/02/2011 12:38:28 ET JOB#: [id number]  cc: Meleana Commerford S. Manuella Ghazi, MD, <Dictator> Deborra Medina, MD Lollie Sails, MD Jolanta B. Bary Leriche, MD Remer Macho MD ELECTRONICALLY SIGNED 03/05/2011 22:20

## 2014-06-21 NOTE — Consult Note (Signed)
PATIENT NAME:  Alyssa Ortega, Alyssa Ortega MR#:  [id number] DATE OF BIRTH:  08-07-1930  DATE OF CONSULTATION:  02/27/2011  REFERRING PHYSICIAN:  Lenore Cordia, MD  CONSULTING PHYSICIAN:  Kienan Doublin B. Andreah Goheen, MD  REASON FOR CONSULTATION: To evaluate suicidal patient.   IDENTIFYING DATA: Ms. Delao is a 81 year old female with history of alcoholism who was admitted to the hospital intoxicated.   CHIEF COMPLAINT: "I'm fine".  HISTORY OF PRESENT ILLNESS: Ms. Notaro been sent has been living independently. She reports that her son who has problem with drinking visited her with a friend and she had some alcohol with Coke. She was drunk and agitated in the Emergency Room and stated that she wants to die. She was placed on involuntary commitment and admitted to Medicine for acute pancreatitis and has a Actuary. At the time of assessment, the patient is sober. She adamantly denies any problems with mood or suicide or homicidal thoughts. She does not remember what happened. She denies excessive alcohol use stating that since her hip fracture two years ago she stopped drinking alcohol altogether. She denies symptoms of depression, anxiety, or psychosis. There are no symptoms suggestive of bipolar mania. She denies alcohol, prescription pills, or illicit substance use.   PAST PSYCHIATRIC HISTORY: She admits that she has history of drinking up until two years ago when she stopped. She denies ever being hospitalized for mood, anxiety, or treatment of alcoholism and there is no record in our hospital about any treatment here. She denies suicide attempts.   FAMILY PSYCHIATRIC HISTORY: She has two sons with substance abuse problems, mostly drinking.   PAST MEDICAL HISTORY:  1. Acute alcoholic pancreatitis. 2. Upper GI bleed. 3. Hypertension.  4. Dyslipidemia.  5. 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 6. Thrombocytopenia from chronic alcohol abuse. 7. Osteoporosis. 8. Gastroesophageal reflux disease.    ALLERGIES: No known drug allergies.   MEDICATIONS ON ADMISSION:  1. Simvastatin 20 mg daily.  2. Lisinopril 40 mg daily. 3. Calcium and Vitamin D daily.  4. Acetaminophen.   MEDICATIONS AT THE TIME OF CONSULTATION:  1. Calcium daily.  2. Folic acid. 3. CIWA protocol.  4. Lisinopril 40 mg daily. 5. Zofran as needed.  6. Protonix injection.  7. Zocor 20 mg daily.  SOCIAL HISTORY: She lives independently. Her daughter and son-in-law who live close by help her with shopping. She does not drive any longer. She loves to cook. She feels that she is perfectly suited to maintain independent living. She regrets that her youngest son does not visit with her and uses poor resources as an excuse. She makes a comment that he can afford to drink his beer but can't afford to buy gas to visit with his mama. She has another son who came to visit on the day of admission who also is a drinker and plans to move in with the patient, quit his job to take care of her. She supports herself from retirement, gets 1200 dollars monthly. She owns the house and is able to pay her property taxes. Does not have any intention to die in a nursing home. She denies other than alcohol substance use.   REVIEW OF SYSTEMS: CONSTITUTIONAL: No fevers or chills. No weight changes. EYES: No double or blurred vision. ENT: No hearing loss. RESPIRATORY: No shortness of breath or cough. CARDIOVASCULAR: No chest pain or orthopnea. GASTROINTESTINAL: Positive for abdominal pain. GU: No incontinence or frequency. ENDOCRINE: No heat or cold intolerance. LYMPHATIC: No anemia or easy bruising. INTEGUMENTARY: No acne or rash. MUSCULOSKELETAL:  No muscle or joint pain. NEUROLOGIC: No tingling or weakness. PSYCHIATRIC: See history of present illness for details.   PHYSICAL EXAMINATION:   VITAL SIGNS: Blood pressure 158/84, pulse 91, respirations 20, temperature 98.2.   GENERAL: This is a slender female in no acute distress. Normal muscle tone. No  stiffness or cogwheeling. No tremor.   LABORATORY, DIAGNOSTIC, AND RADIOLOGICAL DATA: Chemistries within normal limits except for blood glucose of 112. Lipase 605. Ethanol on admission 0.288. LFTs within normal limits except for AST of 65. Cardiac enzymes negative. TSH 4.4. CBC within normal limits except for low platelets of 129. Urinalysis is not suggestive of urinary tract infection. Serum acetaminophen and salicylates are low. Sinus tachycardia, left axis deviation, pulmonary disease pattern infarct of unknown age.   MENTAL STATUS EXAMINATION: The patient is alert and oriented to person, place, time, and situation. She is pleasant, polite, and cooperative, very funny. Interacting properly, engaging. She seems like someone who is really deprived of company and enjoys talking to me. She maintains good eye contact. She is wearing a hospital gown. She is well groomed. Her speech is of normal rhythm, rate, and volume. Mood is great with full affect. Thought processing is logical and goal oriented. Thought content she denies suicidal or homicidal ideation and admits that she could have possibly said things while drunk but then she denies being drunk altogether. There are no thoughts of hurting others. She is able to contract for safety. There are no delusions or paranoia. There are no auditory or visual hallucinations. Her cognition is grossly intact. She registers 3 out of 3 and recalls 3 out of 3 objects after five minutes. She can name four presidents. She can spell world and do serial sevens with no problem. Her abstraction is intact. Her insight and judgment are questionable.   SUICIDE RISK ASSESSMENT: This is a patient with long history of alcoholism, probably still drinking, who is in denial but adamantly denies any problems with depression and anxiety ever. No suicide attempts. She intends to stop drinking. She is wondering whether it would be appropriate to invite her son over.   DIAGNOSES:  AXIS I:   1. Alcohol intoxication.  2. Alcohol dependence   AXIS II: Deferred.   AXIS III:  1. Alcoholic liver disease.  2. Alcoholic pancreatitis.  3. Hypertension.  4. Dyslipidemia.   AXIS IV: Chronic. Substance abuse, poor insight into her problems, primary support.   AXIS V: GAF 45.   PLAN: The patient does not meet criteria for involuntary inpatient psychiatric commitment. Please continue CIWA. She denies any symptoms of depression, anxiety, and declines any treatment pharmacotherapy or psychotherapy. I will follow-up.   ____________________________ Wardell Honour. Bary Leriche, MD jbp:drc D: 02/28/2011 17:05:35 ET T: 03/01/2011 10:12:54 ET JOB#: [id number]  cc: Kaili Castille B. Bary Leriche, MD, <Dictator> Clovis Fredrickson MD ELECTRONICALLY SIGNED 03/08/2011 23:34

## 2014-06-21 NOTE — H&P (Signed)
PATIENT NAME:  Alyssa Ortega, Alyssa Ortega MR#:  [id number] DATE OF BIRTH:  [birth year]  DATE OF ADMISSION:  09/11/2011  PRIMARY CARE PHYSICIAN: She said Dr. Fabio Asa wife. She called her Dr. Loni Muse.   CHIEF COMPLAINT: Right hip pain status post fall.   HISTORY OF PRESENT ILLNESS: [name] is a 81 year old pleasant Caucasian female who lives at home alone. She stated that she tripped and fell while trying to move from the kitchen to the living room resulting in severe right hip pain. She managed to call the [facility] and then she called her son and transported by ambulance to the Emergency Department. Evaluation here with CAT scan was consistent with right femur neck fracture. The patient was admitted to the hospital in consultation with the Orthopedic team. The patient reports no other injury other than her right hip. She has no syncope. No focal weakness.   REVIEW OF SYSTEMS: CONSTITUTIONAL: Denies any fever. No chills. No fatigue. EYES: No blurring of vision. No double vision. ENT: No hearing impairment. No sore throat. No dysphagia. CARDIOVASCULAR: No chest pain. No shortness of breath. No edema. No syncope. RESPIRATORY: No cough. No sputum production. No chest pain. No shortness of breath. GASTROINTESTINAL: No abdominal pain. No vomiting. No diarrhea. GENITOURINARY: No dysuria. No frequency of urination. MUSCULOSKELETAL: She has right hip pain. Other than that she has no other joint pain or swelling. No muscular pain or swelling. INTEGUMENTARY: No skin rash. No ulcers. NEUROLOGY: No focal weakness. No seizure activity. PSYCHIATRY: No anxiety. No depression. ENDOCRINE: No polyuria or polydipsia. No heat or cold intolerance.   PAST MEDICAL HISTORY:  1. Systemic hypertension. 2. Hyperlipidemia.  3. Alcohol abuse. 4. Thrombocytopenia related to her alcohol intake.  5. Osteoporosis. 6. Gastroesophageal reflux disease.   PAST SURGICAL HISTORY:  1. Left hip fracture status post repair.  2. Left eye surgery after  having injury during childhood.  3. Right eye cataract surgery.  4. Appendectomy at age of 34. 5. Hysterectomy at age of 86 when she had uterine bleeding.   FAMILY HISTORY: Her mother died at the age of 80. She suffered from Alzheimer's dementia. Her father died of old age without significant illness.   SOCIAL HABITS: Ex chronic smoker. She quit about 12 years ago. Regarding alcohol she drinks two beers a day.   SOCIAL HISTORY: She is widowed. Lives at home alone.   ADMISSION MEDICATIONS:  1. Nexium 40 mg a day. 2. Lisinopril 40 mg a day.   ALLERGIES: No known drug allergy. In the past reported aspirin but it was a side effect causing a stomach ulcer.   PHYSICAL EXAMINATION:   VITAL SIGNS: Blood pressure 120/48, respiratory rate 20, pulse 80, temperature 96.5, oxygen saturation 97%.   GENERAL APPEARANCE: Elderly lady laying in bed in no acute distress.   HEAD: No pallor. No icterus. No cyanosis.   EARS, NOSE, AND THROAT: Hearing was normal. Nasal mucosa, lips, tongue were normal. She is edentulous.   EYES: Normal eyelids and conjunctivae. She has squint in the left eye. Pupils are about 4 mm and sluggishly reactive to light.   NECK: Supple. Trachea at midline. No thyromegaly. No cervical lymphadenopathy. No masses.   HEART: Normal S1, S2. No S3, S4. No murmur. No gallop. No carotid bruits.   RESPIRATORY: Normal breathing pattern without use of accessory muscles. No rales. No wheezing.   ABDOMEN: Soft without tenderness. No hepatosplenomegaly. No masses. No hernias.   SKIN: No ulcers. No subcutaneous nodules.   MUSCULOSKELETAL: No joint swelling.  No clubbing. She has tenderness at right hip area and anterior thigh. Distal pulses were present. Dorsalis pedis on the left was +2; on the right was +1. Both feet are warm although the left foot is warmer than the right.   NEUROLOGIC: Cranial nerves II through XII are intact. No focal motor deficit.   PSYCHIATRIC: The patient is  alert and oriented x3. Mood and affect were normal.   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 EKG is unchanged compared to her old EKG that was done in May 2012 with the exception that there is a T wave inversion in lead V3.   Chest x-ray showed normal heart size. No effusion. No consolidation. There is mild prominence of pulmonary markings and pulmonary vasculature.   Serum glucose 95, BUN 4, creatinine 0.6, sodium 131, potassium 3.5, calcium 8.2, albumin 3.2. Liver function tests were normal except for slight elevation of AST at 40. Bilirubin 0.3. Total CPK 73. Troponin less than 0.02. CBC showed white count of 7000, hemoglobin 12, hematocrit 35, platelet count 128. Urinalysis is pending.   IMPRESSION: 81 year old with: 1. Right femoral neck fracture status post fall.  2. Mild hyponatremia.  3. Systemic hypertension.  4. Hyperlipidemia.  5. History of alcoholism.  6. Mild thrombocytopenia attributed to her alcohol intake.  7. History of osteoporosis.  8. History of gastroesophageal reflux disease and peptic ulcer disease.    PLAN: 1. The patient is at mild risk for her surgery. There is no contraindication to proceed.  2. Will continue lisinopril and Nexium.  3. For deep vein thrombosis prophylaxis, I ordered TED stockings, knee-high, and postoperatively maybe she will need antithrombotic pneumatic device.  4. We need to follow-up on her urinalysis as it was not done yet.   The patient reports she does have a LIVING WILL and she had appointed also her daughter, [name], to have the power-of-attorney.   TIME SPENT EVALUATING THIS PATIENT AND REVIEWING MEDICAL RECORDS: More than 55 minutes.   ____________________________ Clovis Pu. Lenore Manner, MD amd:drc D: 09/11/2011 06:20:42 ET T: 09/11/2011 09:28:05 ET JOB#: [id number]  cc: Clovis Pu. Lenore Manner, MD, <Dictator> Maureen P.  Heber Fulton, MD Ellin Saba MD ELECTRONICALLY SIGNED 09/11/2011 22:21

## 2014-06-21 NOTE — Consult Note (Signed)
Brief Consult Note: Diagnosis: Alcohol intoxication, alcohol abuse.   Patient was seen by consultant.   Consult note dictated.   Recommend further assessment or treatment.   Orders entered.   Discussed with Attending MD.   Comments: Alyssa Ortega has a h/o alcoholism. She reports beeing sober since she fractured her hip several years ago. She denies current alcohol use until the day of admission when she had a visit from her son and his friend, both drinkers. This son wants to move in with the patient and quit his job.   MSE: Alert, oriented, pleasant, polite, funny, engaging. Normal speech, good mood, full affect. NO SI, HI, Delusions, Paranoia, AH, VH. Cognition grossly intact. I/J questionable.    PLAN: 1. The patient is no longer suicidal. I will discontinue sitter. Please discharge as appropriate.   2. She denies depressive symptoms and declines treatment.   3. Please continue alcohol detox protocol.  4. The patient is not interested in treatment of alcoholism.   5. Case manager consult to determine if she has enough support to live independently.  Electronic Signatures: Orson Slick (MD)  (Signed 01-Jan-13 14:01)  Authored: Brief Consult Note   Last Updated: 01-Jan-13 14:01 by Orson Slick (MD)

## 2014-06-21 NOTE — H&P (Signed)
PATIENT NAME:  Alyssa Ortega, Alyssa Ortega MR#:  [id number] DATE OF BIRTH:  07/11/1930  DATE OF ADMISSION:  02/27/2011  PRIMARY CARE PHYSICIAN:  Dr. Deborra Medina  CHIEF COMPLAINT: Suicidal ideations, upper GI bleeding, alcohol intoxication.   HISTORY OF PRESENT ILLNESS: The patient is a 81 year old female with a history of anxiety. She has a history of hypertension and alcohol abuse. She presents with chief complaint of alcohol intoxication. She reported she "wanted to die". She has been drinking alcohol with her son and she repeatedly said she "wants to die". She lives alone. Police were called out to her house. She reported that she wants to join her deceased parents in heaven. She appeared manic and had pressured speech.   PAST MEDICAL HISTORY:  1. Hypertension.  2. Hyperlipidemia.  3. Alcohol abuse, alcohol withdrawal, alcoholic hallucinations. 4. 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  5. Thrombocytopenia from chronic alcohol abuse.  6. History of malnutrition.  7. Osteoporosis.  8. Gastroesophageal reflux disease.   ALLERGIES: No known drug allergies.   CURRENT MEDICATIONS:  1. Simvastatin 20 mg p.o. daily. 2. Lisinopril 40 mg p.o. daily. 3. Calcium 600 with vitamin D 1 p.o. daily in a.m. and p.m. 4. Acetaminophen 325 mg, one 3-4 hours p.r.n.   PAST SURGICAL HISTORY:  1. Hysterectomy.  2. Appendectomy. 3. Left hip repair after fracture. 4. Left eye surgery secondary to childhood injuries.   FAMILY HISTORY: Mother deceased with a history of Alzheimer's dementia. Father deceased, he was relatively healthy.   SOCIAL HISTORY: Negative for tobacco abuse. The patient has a history of alcohol abuse. She continues to drink alcohol. She lives in [place]. No history of IV drug abuse.   REVIEW OF SYSTEMS: The patient has alcohol intoxication. The patient is uncooperative. She denies any fevers, chills, or night sweats. HEENT: The patient denies any hearing loss, dysphagia, visual problems, or  sore throat. CARDIOVASCULAR: The patient denies any chest pain, orthopnea, or PND. RESPIRATORY: The patient denies any cough, wheezing, or hemoptysis. GI: The patient denies any nausea, vomiting, abdominal pain, hematemesis, hematochezia, or melena. GU: The patient denies any hematuria, dysuria, or frequency. NEUROLOGIC: The patient denies any headache, focal weakness, or seizures. SKIN: The patient denies any lesions or rash. ENDOCRINE: The patient denies polyuria, polyphagia, or polydipsia. MUSCULOSKELETAL: The patient denies any arthritis, joint effusion, or swelling.  HEMATOLOGICAL: The patient denies any easy bleeding or bruises.   PHYSICAL EXAMINATION:  VITAL SIGNS: Temperature 97.7, heart rate 138, respiratory rate 20, blood pressure 181/55, oxygen saturation 96%.  HEENT: Atraumatic, normocephalic. Pupils equal, round, reactive to light and accommodation. Extraocular movements are intact. Sclerae anicteric. Mucous membranes dry.   NECK: Supple. No organomegaly.   CARDIOVASCULAR: S1, S2, regular rate, rhythm. No gallops. No thrills. No murmurs.   RESPIRATORY: Lungs are clear to auscultation. No rales, no rhonchi, no wheezes, no bronchial breath sounds.   GI: Abdomen is soft and nontender, nondistended. Normal bowel sounds. No hepatosplenomegaly.   GU: There is no hematuria or masses noted.   SKIN: No lesions. No rash.   ENDOCRINE: No masses. No thyromegaly.   LYMPH: No lymphadenopathy or nodes palpable.   NEUROLOGIC: Cranial nerves II through XII grossly intact. Motor strength is five out of five bilateral upper and lower extremities. Sensation is within normal limits. No focal neurological deficit noted on examination.   MUSCULOSKELETAL: There is no arthritis, joint effusion, or swelling.   HEMATOLOGIC: No ecchymosis, no bleeding, and no petechiae noted.   EKG: Sinus tachycardia at 134 beats per  minute, left axis deviation.   LABORATORY STUDIES:  Tylenol  level less than 2. WBC  count 6200, hemoglobin 13.9, hematocrit 41, platelet count 129. Glucose 112, BUN 6, creatinine 0.91, sodium 143, potassium 4, chloride 105, CO2 25, calcium 8.6, total bilirubin 0.2, alkaline phosphatase 92, ALT 33, AST 65, total protein 7.2, albumin 3.3. Estimated GFR is greater than 60. Ethanol level is 0.288. Lipase 605. PT 13.3. INR 1. Salicylate level less than 1.7. Troponin is less than 0.02. TSH 4.4.   ASSESSMENT AND PLAN:  1. The patient is a 81 year old female who presents with alcohol intoxication and upper GI bleeding. We will admit the patient to the telemetry unit. Keep patient n.p.o. The patient's lipase is elevated consistent with acute alcoholic pancreatitis. We will start IV fluids. Monitor lipase. Upper GI bleeding. We will give IV Protonic. Gastroenterology consultation.  2. Anxiety. Continue Xanax. 3. Thrombocytopenia, likely due to alcohol abuse. Monitor closely.  4. Suicide ideations. Suicide precautions. Psychiatry consultation.  ____________________________ Tyrone Schimke, MD jsp:bjt D: 02/27/2011 03:40:07 ET T: 02/27/2011 09:33:52 ET JOB#: [id number]  cc: Deborra Medina, MD Tyrone Schimke MD ELECTRONICALLY SIGNED 02/28/2011 2:21

## 2014-06-21 NOTE — H&P (Signed)
PATIENT NAME:  Alyssa Ortega, Alyssa Ortega MR#:  [id number] DATE OF BIRTH:  01-11-1931  DATE OF ADMISSION:  02/27/2011  ADDENDUM  ASSESSMENT AND PLAN:  1. Hyperlipidemia: Continue simvastatin. 2. Hypertension: Continue lisinopril. ____________________________ Tyrone Schimke, MD jsp:slb D: 02/27/2011 04:04:36 ET T: 02/27/2011 09:27:49 ET JOB#: [id number]  cc: Tyrone Schimke, MD, <Dictator> Tyrone Schimke MD ELECTRONICALLY SIGNED 02/28/2011 2:21

## 2014-06-21 NOTE — H&P (Signed)
PATIENT NAME:  Alyssa Ortega, Alyssa Ortega MR#:  [id number] DATE OF BIRTH:  1931/01/10  DATE OF ADMISSION:  09/11/2011  CHIEF COMPLAINT: Right hip pain.   HISTORY OF PRESENT ILLNESS: The patient is a 81 year old who tripped over carpet earlier this morning. She suffered a fall and was brought in to the Emergency Room and found to have a femoral neck fracture. She is being admitted for treatment of this.   PAST MEDICAL HISTORY:  1. Prior left femoral neck fracture treated with endoprosthesis with good result. Subsequently did need repeat surgery for bursitis.  2. Problems related to alcohol abuse.  3. Most recent admission was in December 2012 with suicide and alcohol abuse. 4. Hypertension.  5. Hyperlipidemia.  6. EtOH abuse. 7. Anemia, thrombocytopenia from chronic alcohol use. 8. History of malnutrition. 9. Osteoporosis.  10. Reflux disease.   DRUG ALLERGIES: No known drug allergies.   PAST SURGICAL HISTORY:  1. Hysterectomy. 2. Appendectomy. 3. Left hip hemiarthroplasty. 4. Left eye surgery.   MEDICATIONS ON ADMISSION:  1. Nexium 40 mg 1 p.o. daily.  2. Lisinopril 40 mg p.o. daily.   FAMILY HISTORY: Positive for Alzheimer's.   SOCIAL HISTORY: She denies smoking. She does continue to drink and has a history of alcoholism. She lives in [place].   REVIEW OF SYSTEMS: GENERAL: Positive just for right hip pain.   PHYSICAL EXAMINATION:   HEENT: Normocephalic and atraumatic. She has disconjugate gaze.   NECK: Supple.   HEART: Regular rhythm. No murmur noted.   LUNGS: Clear to auscultation.   ABDOMEN: Nontender.   EXTREMITIES: Remarkable for shortened and externally rotated right lower extremity. She has trace dorsalis pedis pulse. She does have intact sensation AND is able to flex and extend the toes.   RADIOLOGIC DATA: X-rays reveal displaced femoral neck fracture, subcapital.   PLAN: Preoperative medical consultation and surgical intervention will be right hip hemiarthroplasty. The  patient has had the procedure before. She understands the risks, benefits, and possible complications. ____________________________ Laurene Footman, MD mjm:slb D: 09/11/2011 15:17:61 ET T: 09/11/2011 08:01:34 ET JOB#: [id number]  cc: Laurene Footman, MD, <Dictator> Laurene Footman MD ELECTRONICALLY SIGNED 09/11/2011 11:03

## 2014-06-21 NOTE — Consult Note (Signed)
Chief Complaint:   Subjective/Chief Complaint discussed pancreatitis with patient.  Still denies regular etoh use.  denies abd pain, n, v.   VITAL SIGNS/ANCILLARY NOTES: **Vital Signs.:   01-Jan-13 09:35   Vital Signs Type Routine   Temperature Temperature (F) 98.1   Celsius 36.7   Temperature Source oral   Pulse Pulse 74   Pulse source per Dinamap   Respirations Respirations 18   Systolic BP Systolic BP 827   Diastolic BP (mmHg) Diastolic BP (mmHg) 70   Mean BP 90   BP Source Dinamap   Pulse Ox % Pulse Ox % 97   Pulse Ox Activity Level  At rest   Oxygen Delivery Room Air/ 21 %   Brief Assessment:   Cardiac Regular    Respiratory clear BS    Gastrointestinal details normal Soft  Nontender  Nondistended  No masses palpable  Bowel sounds normal   Routine Chem:  31-Dec-12 00:28    Lipase 605  01-Jan-13 07:04    Hemoglobin A1c ([facility]) 4.7    07:07    Lipase 250  Cardiac:  01-Jan-13 07:07    Troponin I 0.05  Routine Chem:  01-Jan-13 07:07    Cholesterol, Serum 117   Triglycerides, Serum 88   HDL ([facility]HOU[facility]) 30   VLDL Cholesterol Calculated 18   LDL Cholesterol Calculated 69   Radiology Results: [place]:    01-Jan-13 08:54, US Abdomen General Survey   US Abdomen General Survey    REASON FOR EXAM:    pancreatitis  COMMENTS:       PROCEDURE: [place]  - US ABDOMEN GENERAL SURVEY  - Feb 28 2011  8:54AM     RESULT: Comparison: None.    Technique: Multiple grayscale and color Doppler images were obtained of   the abdomen.    Findings:  The visualized liver and pancreas are unremarkable. The tail of the   pancreas is obscured by overlying bowel gas.  Spleen is unremarkable. The gallbladder is normal. Sonographic Murphy   sign was negative. The common bile duct measures 4 mm.  Images of the kidneys showed no hydronephrosis.    IMPRESSION:   Normal gallbladder. No acute findings.          Verified By: Gregor Hams, M.D., MD   Assessment/Plan:  Assessment/Plan:    Assessment 1) acute pancreatitis-resolved-?etoh related.    Plan discussed with Dr Manuella Ghazi.  Advance diet as tolerated.  counselled etoh abstinence.  will sign off.   Electronic Signatures: Loistine Simas (MD)  (Signed 01-Jan-13 13:52)  Authored: Chief Complaint, VITAL SIGNS/ANCILLARY NOTES, Brief Assessment, Lab Results, Radiology Results, Assessment/Plan   Last Updated: 01-Jan-13 13:52 by Loistine Simas (MD)

## 2014-06-21 NOTE — Consult Note (Signed)
PATIENT NAME:  Alyssa Ortega, Alyssa Ortega MR#:  [id number] DATE OF BIRTH:  03-May-1930  DATE OF CONSULTATION:  02/27/2011  REFERRING PHYSICIAN:  Lenore Cordia, MD CONSULTING PHYSICIAN:  Lollie Sails, MD  REASON FOR CONSULTATION: Alcohol-related pancreatitis and upper gastrointestinal bleed.   HISTORY OF PRESENT ILLNESS: Ms. Descoteaux is a 81 year old Caucasian female apparently with a history of alcohol abuse. However, she vehemently denies the use of alcohol to me with the exception of some that she had last night with her son. She had a significant alcohol level when she came in the hospital. When I asked her why she came in the hospital, she states, "I have no idea". There was the aspect of this consult put in for upper gastrointestinal bleed. However, she denies any problems with nausea, vomiting, hematemesis, black stools, or abdominal pain. She does have a history of ulcers about two years ago, these being duodenal ulcers with an esophagogastroduodenoscopy being done on 12/2007. At that time, she had three duodenal ulcers as well as a Schatzki ring. She states, however, that she did have a very severe nosebleed that occurred for a course of five days before this past Thanksgiving. Subsequently, she has seen Dr. Carlis Abbott who said that she had an "infection in her nose" and put her on a 21 day course of medication. She is uncertain what that medication is. She has a history of recurrent epistaxis. She has a bowel movement daily. She denies any black stools, blood in the stools, or slimy stools. She states she has never had a colonoscopy and "I don't want one". She gets some occasional left upper leg pain related with apparently previous surgery on the left hip. She denies any heartburn or dysphagia.   GASTROINTESTINAL FAMILY HISTORY: Son with colon cancer, daughter with stomach cancer. She denies any history of alcohol or tobacco use. Please note above. Please note other consults today.   PAST MEDICAL HISTORY:   1. Hypertension. 2. Hyperlipidemia. 3. Alcohol abuse. 4. Alcohol withdrawal. 5. Alcoholic hallucinations. 6. 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 7. History of thrombocytopenia, likely secondary to alcoholism. 8. History of malnutrition. 9. Osteoporosis. 10. Gastroesophageal reflux.  11. Hysterectomy. 12. Appendectomy. 13. Left hip repair after fracture. She does not have a hip replacement.  14. Left eye surgery from childhood injury and she has a marked exotropia.   SOCIAL HISTORY: She denies tobacco use. History of alcohol abuse as above. No history of IV drug use.   REVIEW OF SYSTEMS: 10 systems reviewed per admission history and physical, agree with same.   OUTPATIENT MEDICATIONS:  1. Lisinopril 40 mg once a day in the past.  2. She has been on acetaminophen, calcium supplements, and simvastatin, all of which she is no longer taking. She claims an allergy to aspirin and denies the use of aspirins or NSAIDs. She states she does not take these agents because of the epistaxis problems that she has had.   PHYSICAL EXAMINATION:  VITAL SIGNS: Temperature 98.2, pulse 91, respirations 20, blood pressure 158/84, pulse oximetry 97%.   GENERAL: She is an elderly-appearing 80 year old Caucasian female in no acute distress, pleasant.   HEENT: Normocephalic, atraumatic.   EYES: Anicteric. Marked left exotropia.   NOSE: Septum midline.   OROPHARYNX: Poor dentition.   NECK: Supple. No JVD.   HEART: Regular rate and rhythm.   LUNGS: Bilaterally clear.   ABDOMEN: Soft, nontender, and nondistended. Bowel sounds positive, normoactive.   RECTAL: Anorectal exam is deferred.   EXTREMITIES: No clubbing, cyanosis, or edema.  NEUROLOGICAL: Cranial nerves II through XII grossly intact. Please note exotropia.   LABORATORY, RADIOLOGICAL AND DIAGNOSTIC DATA: On admission to the hospital, she had a glucose of 112, BUN 6, creatinine 0.91, sodium 143, potassium 4.0, chloride 105,  bicarbonate 25, calcium 8.6, lipase elevated at 605 on a scale of 393 being cut-off. Her ethanol level was 0.288%. She has had a troponin that was noted elevated at 0.8. Hepatic profile showing a total protein of 7.2, albumin of 3.3, bilirubin 0.2, alkaline phosphatase 92, AST 65, ALT 33. Hemogram showing white count of 6.2, hemoglobin and hematocrit 13.9/41.0, platelet count 129. Repeat hemoglobin this morning compared to midnight was 12.6 after hydration. Her INR is 1.0. ProTime 13.3. Urinalysis was negative. Serum acetaminophen and salicylate levels were low/normal. She has a sinus tachycardia, inferior infarct, age indeterminate. She had a CT scan of the head without contrast for altered mental status showing involutional changes without evidence of acute abnormality. She had a chest one view film showing no acute disease of the chest.   ASSESSMENT:  1. The patient denies any aspect of gastrointestinal bleeding. She has had no evidence of gastrointestinal bleeding since her hospitalization in the ER.  2. Pancreatitis. The patient has a low elevation of lipase in the setting of known history of alcohol abuse. Psychiatry consult is pending.   RECOMMENDATIONS:  1. Recheck her LFTs and lipase in the morning.  2. She is currently not having any epigastric pain.  3. Will order an abdominal ultrasound for imaging purposes. We will follow with you.       4. Since the patient shows no evidence of gastrointestinal bleeding, would recommend PPI prophylaxis while in the hospital.   ____________________________ Lollie Sails, MD mus:ap D: 02/27/2011 16:44:10 ET T: 02/27/2011 16:52:54 ET JOB#: [id number]  cc: Lollie Sails, MD, <Dictator> Lollie Sails MD ELECTRONICALLY SIGNED 03/09/2011 10:02

## 2014-06-23 ENCOUNTER — Emergency Department
Admit: 2014-06-23 | Disposition: A | Discharge status: Home / Self Care | Payer: Self-pay | Admitting: Emergency Medicine

## 2014-06-23 LAB — COMPREHENSIVE METABOLIC PANEL
ANION GAP: 7 (ref 7–16)
Albumin: 3.4 g/dL — ABNORMAL LOW
Alkaline Phosphatase: 96 U/L
BILIRUBIN TOTAL: 0.6 mg/dL
BUN: 10 mg/dL
CO2: 26 mmol/L
CREATININE: 0.59 mg/dL
Calcium, Total: 8.5 mg/dL — ABNORMAL LOW
Chloride: 100 mmol/L — ABNORMAL LOW
EGFR (African American): >60
GLUCOSE: 104 mg/dL — AB
POTASSIUM: 3.1 mmol/L — AB
SGOT(AST): 31 U/L
SGPT (ALT): 17 U/L
SODIUM: 133 mmol/L — AB
Total Protein: 6.6 g/dL

## 2014-06-23 LAB — CBC
HCT: 35.3 % (ref 35.0–47.0)
HGB: 11.7 g/dL — AB (ref 12.0–16.0)
MCH: 28.5 pg (ref 26.0–34.0)
MCHC: 33.2 g/dL (ref 32.0–36.0)
MCV: 86 fL (ref 80–100)
Platelet: 121 10*3/uL — ABNORMAL LOW (ref 150–440)
RBC: 4.11 10*6/uL (ref 3.80–5.20)
RDW: 17.3 % — ABNORMAL HIGH (ref 11.5–14.5)
WBC: 7.8 10*3/uL (ref 3.6–11.0)

## 2014-06-23 LAB — URINALYSIS, COMPLETE
BILIRUBIN, UR: NEGATIVE
Blood: NEGATIVE
Glucose,UR: NEGATIVE mg/dL (ref 0–75)
Ketone: NEGATIVE
Nitrite: NEGATIVE
Ph: 6 (ref 4.5–8.0)
Protein: NEGATIVE
Specific Gravity: 1.006 (ref 1.003–1.030)

## 2014-06-23 LAB — TROPONIN I

## 2014-07-01 ENCOUNTER — Emergency Department: Payer: Commercial Managed Care - HMO

## 2014-07-01 ENCOUNTER — Encounter: Payer: Self-pay | Admitting: *Deleted

## 2014-07-01 ENCOUNTER — Inpatient Hospital Stay
Admission: EM | Admit: 2014-07-01 | Discharge: 2014-07-03 | DRG: 603 | Disposition: A | Discharge status: Skilled Nursing Facility | Payer: Commercial Managed Care - HMO | Attending: Internal Medicine | Admitting: Internal Medicine

## 2014-07-01 DIAGNOSIS — G9520 Unspecified cord compression: Secondary | ICD-10-CM | POA: Diagnosis not present

## 2014-07-01 DIAGNOSIS — K219 Gastro-esophageal reflux disease without esophagitis: Secondary | ICD-10-CM | POA: Diagnosis present

## 2014-07-01 DIAGNOSIS — A419 Sepsis, unspecified organism: Secondary | ICD-10-CM

## 2014-07-01 DIAGNOSIS — Z8711 Personal history of peptic ulcer disease: Secondary | ICD-10-CM | POA: Diagnosis not present

## 2014-07-01 DIAGNOSIS — G4700 Insomnia, unspecified: Secondary | ICD-10-CM | POA: Diagnosis present

## 2014-07-01 DIAGNOSIS — F1010 Alcohol abuse, uncomplicated: Secondary | ICD-10-CM | POA: Diagnosis present

## 2014-07-01 DIAGNOSIS — M810 Age-related osteoporosis without current pathological fracture: Secondary | ICD-10-CM | POA: Diagnosis present

## 2014-07-01 DIAGNOSIS — R918 Other nonspecific abnormal finding of lung field: Secondary | ICD-10-CM | POA: Diagnosis not present

## 2014-07-01 DIAGNOSIS — L03116 Cellulitis of left lower limb: Secondary | ICD-10-CM | POA: Diagnosis not present

## 2014-07-01 DIAGNOSIS — L02419 Cutaneous abscess of limb, unspecified: Secondary | ICD-10-CM | POA: Diagnosis present

## 2014-07-01 DIAGNOSIS — W19XXXA Unspecified fall, initial encounter: Secondary | ICD-10-CM | POA: Diagnosis present

## 2014-07-01 DIAGNOSIS — Z888 Allergy status to other drugs, medicaments and biological substances status: Secondary | ICD-10-CM | POA: Diagnosis not present

## 2014-07-01 DIAGNOSIS — E876 Hypokalemia: Secondary | ICD-10-CM | POA: Diagnosis present

## 2014-07-01 DIAGNOSIS — R609 Edema, unspecified: Secondary | ICD-10-CM

## 2014-07-01 DIAGNOSIS — Z79899 Other long term (current) drug therapy: Secondary | ICD-10-CM

## 2014-07-01 DIAGNOSIS — G8929 Other chronic pain: Secondary | ICD-10-CM | POA: Diagnosis present

## 2014-07-01 DIAGNOSIS — M7989 Other specified soft tissue disorders: Secondary | ICD-10-CM | POA: Diagnosis present

## 2014-07-01 DIAGNOSIS — R000 Tachycardia, unspecified: Secondary | ICD-10-CM | POA: Diagnosis present

## 2014-07-01 DIAGNOSIS — R0682 Tachypnea, not elsewhere classified: Secondary | ICD-10-CM | POA: Diagnosis present

## 2014-07-01 DIAGNOSIS — D696 Thrombocytopenia, unspecified: Secondary | ICD-10-CM | POA: Diagnosis present

## 2014-07-01 DIAGNOSIS — L03119 Cellulitis of unspecified part of limb: Secondary | ICD-10-CM

## 2014-07-01 DIAGNOSIS — M79605 Pain in left leg: Secondary | ICD-10-CM | POA: Diagnosis present

## 2014-07-01 DIAGNOSIS — R410 Disorientation, unspecified: Secondary | ICD-10-CM | POA: Diagnosis not present

## 2014-07-01 DIAGNOSIS — Z9981 Dependence on supplemental oxygen: Secondary | ICD-10-CM | POA: Diagnosis not present

## 2014-07-01 DIAGNOSIS — R339 Retention of urine, unspecified: Secondary | ICD-10-CM | POA: Diagnosis not present

## 2014-07-01 DIAGNOSIS — D72829 Elevated white blood cell count, unspecified: Secondary | ICD-10-CM | POA: Diagnosis not present

## 2014-07-01 DIAGNOSIS — R0789 Other chest pain: Secondary | ICD-10-CM | POA: Diagnosis present

## 2014-07-01 DIAGNOSIS — R8290 Unspecified abnormal findings in urine: Secondary | ICD-10-CM | POA: Diagnosis not present

## 2014-07-01 DIAGNOSIS — E785 Hyperlipidemia, unspecified: Secondary | ICD-10-CM | POA: Diagnosis present

## 2014-07-01 DIAGNOSIS — I10 Essential (primary) hypertension: Secondary | ICD-10-CM | POA: Diagnosis present

## 2014-07-01 LAB — COMPREHENSIVE METABOLIC PANEL
ALT: 13 U/L — ABNORMAL LOW (ref 14–54)
AST: 24 U/L (ref 15–41)
Albumin: 3.9 g/dL (ref 3.5–5.0)
Alkaline Phosphatase: 152 U/L — ABNORMAL HIGH (ref 38–126)
Anion gap: 9 (ref 5–15)
BUN: 8 mg/dL (ref 6–20)
CO2: 29 mmol/L (ref 22–32)
Calcium: 9.3 mg/dL (ref 8.9–10.3)
Chloride: 101 mmol/L (ref 101–111)
Creatinine, Ser: 0.61 mg/dL (ref 0.44–1.00)
GFR calc Af Amer: >60 mL/min (ref >60)
GFR calc non Af Amer: >60 mL/min (ref >60)
Glucose, Bld: 94 mg/dL (ref 65–99)
Potassium: 3.5 mmol/L (ref 3.5–5.1)
SODIUM: 139 mmol/L (ref 135–145)
TOTAL PROTEIN: 7.8 g/dL (ref 6.5–8.1)
Total Bilirubin: 0.8 mg/dL (ref 0.3–1.2)

## 2014-07-01 LAB — CBC
HCT: 38.4 % (ref 35.0–47.0)
HEMATOCRIT: 39.9 % (ref 35.0–47.0)
HEMOGLOBIN: 13.2 g/dL (ref 12.0–16.0)
HEMOGLOBIN: 12.5 g/dL (ref 12.0–16.0)
MCH: 28.3 pg (ref 26.0–34.0)
MCH: 28.1 pg (ref 26.0–34.0)
MCHC: 33.1 g/dL (ref 32.0–36.0)
MCHC: 32.4 g/dL (ref 32.0–36.0)
MCV: 85.5 fL (ref 80.0–100.0)
MCV: 86.7 fL (ref 80.0–100.0)
Platelets: 230 10*3/uL (ref 150–440)
Platelets: 200 10*3/uL (ref 150–440)
RBC: 4.67 MIL/uL (ref 3.80–5.20)
RBC: 4.43 MIL/uL (ref 3.80–5.20)
RDW: 16.4 % — ABNORMAL HIGH (ref 11.5–14.5)
RDW: 16.7 % — ABNORMAL HIGH (ref 11.5–14.5)
WBC: 10.1 10*3/uL (ref 3.6–11.0)
WBC: 10.4 10*3/uL (ref 3.6–11.0)

## 2014-07-01 LAB — TROPONIN I
Troponin I: <0.03 ng/mL (ref <0.031)
Troponin I: <0.03 ng/mL (ref <0.031)

## 2014-07-01 LAB — PROTIME-INR
INR: 1.01
PROTHROMBIN TIME: 13.5 s (ref 11.4–15.0)

## 2014-07-01 LAB — URINALYSIS COMPLETE WITH MICROSCOPIC (ARMC ONLY)
Bacteria, UA: NONE SEEN
Bilirubin Urine: NEGATIVE
Glucose, UA: NEGATIVE mg/dL
Nitrite: NEGATIVE
PH: 7 (ref 5.0–8.0)
Protein, ur: NEGATIVE mg/dL
SPECIFIC GRAVITY, URINE: 1.005 (ref 1.005–1.030)
TRANS EPITHEL UA: 1

## 2014-07-01 LAB — CREATININE, SERUM: CREATININE: 0.56 mg/dL (ref 0.44–1.00)

## 2014-07-01 LAB — APTT: APTT: 35 s (ref 24–36)

## 2014-07-01 MED ORDER — ENOXAPARIN SODIUM 40 MG/0.4ML ~~LOC~~ SOLN
40.0000 mg | SUBCUTANEOUS | Status: DC
  Administered 2014-07-01 – 2014-07-02 (×2): 40 mg via SUBCUTANEOUS
  Filled 2014-07-01 (×2): qty 0.4

## 2014-07-01 MED ORDER — DOCUSATE SODIUM 100 MG PO CAPS
100.0000 mg | ORAL_CAPSULE | Freq: Two times a day (BID) | ORAL | Status: DC
  Administered 2014-07-01 – 2014-07-03 (×4): 100 mg via ORAL
  Filled 2014-07-01 (×4): qty 1

## 2014-07-01 MED ORDER — ACETAMINOPHEN 650 MG RE SUPP: 650.0000 mg | Freq: Four times a day (QID) | RECTAL | Status: DC | PRN

## 2014-07-01 MED ORDER — CLINDAMYCIN PHOSPHATE 600 MG/50ML IV SOLN
600.0000 mg | Freq: Once | INTRAVENOUS | Status: AC
  Administered 2014-07-01: 600 mg via INTRAVENOUS

## 2014-07-01 MED ORDER — OXYCODONE-ACETAMINOPHEN 5-325 MG PO TABS
1.0000 | ORAL_TABLET | Freq: Four times a day (QID) | ORAL | Status: DC | PRN
  Administered 2014-07-01 – 2014-07-03 (×6): 1 via ORAL
  Filled 2014-07-01 (×6): qty 1

## 2014-07-01 MED ORDER — ENSURE ENLIVE PO LIQD
237.0000 mL | Freq: Two times a day (BID) | ORAL | Status: DC
  Administered 2014-07-02 – 2014-07-03 (×3): 237 mL via ORAL

## 2014-07-01 MED ORDER — OXYCODONE-ACETAMINOPHEN 5-325 MG PO TABS
ORAL_TABLET | ORAL | Status: AC
  Administered 2014-07-01: 1
  Filled 2014-07-01: qty 1

## 2014-07-01 MED ORDER — ACETAMINOPHEN 325 MG PO TABS
650.0000 mg | ORAL_TABLET | Freq: Four times a day (QID) | ORAL | Status: DC | PRN
  Filled 2014-07-01: qty 2

## 2014-07-01 MED ORDER — CHOLECALCIFEROL 10 MCG (400 UNIT) PO TABS
400.0000 [IU] | ORAL_TABLET | Freq: Every day | ORAL | Status: DC
  Administered 2014-07-01 – 2014-07-03 (×3): 400 [IU] via ORAL
  Filled 2014-07-01 (×4): qty 1

## 2014-07-01 MED ORDER — GABAPENTIN 300 MG PO CAPS
300.0000 mg | ORAL_CAPSULE | Freq: Two times a day (BID) | ORAL | Status: DC
  Administered 2014-07-01 – 2014-07-03 (×4): 300 mg via ORAL
  Filled 2014-07-01 (×4): qty 1

## 2014-07-01 MED ORDER — LISINOPRIL 20 MG PO TABS
40.0000 mg | ORAL_TABLET | Freq: Every day | ORAL | Status: DC
  Administered 2014-07-01 – 2014-07-03 (×3): 40 mg via ORAL
  Filled 2014-07-01 (×3): qty 2

## 2014-07-01 MED ORDER — CLINDAMYCIN PHOSPHATE 600 MG/50ML IV SOLN
600.0000 mg | Freq: Three times a day (TID) | INTRAVENOUS | Status: DC
  Administered 2014-07-01 – 2014-07-03 (×5): 600 mg via INTRAVENOUS
  Filled 2014-07-01 (×9): qty 50

## 2014-07-01 MED ORDER — CLINDAMYCIN PHOSPHATE 600 MG/50ML IV SOLN
INTRAVENOUS | Status: AC
  Administered 2014-07-01: 600 mg via INTRAVENOUS
  Filled 2014-07-01: qty 50

## 2014-07-01 MED ORDER — SODIUM CHLORIDE 0.9 % IV BOLUS (SEPSIS)
1000.0000 mL | Freq: Once | INTRAVENOUS | Status: AC
  Administered 2014-07-01: 1000 mL via INTRAVENOUS

## 2014-07-01 NOTE — ED Provider Notes (Signed)
[facility] Emergency Department Provider Note    ____________________________________________  Time seen: ----------------------------------------- 11:22 AM on 07/01/2014 -----------------------------------------    I have reviewed the triage vital signs and the nursing notes.   HISTORY  Chief Complaint Chest Pain and Back Pain       HPI ELLORA VARNUM is a 81 y.o. female  with history of hypertension, hyperlipidemia, chronic alcohol abuse, thrombocytopenia, osteoporosis, and gastroesophageal reflux. She has a history of left hip fracture and right hip fracture in the past from multiple falls. She presented with acute on chronic back pain no new injury since most recent imaging on 4/26/16showing stable compression fractures. She also complains of chest pain and by that she reports "pins and needles in my chest", this has also been ongoing. She also reports nearly a week of left lower extremity redness and swelling. No fevers at home. Onset of the left leg swelling was gradual, is now moderate to severe, she reports it has been red and warm swollen. She denies any recent injury to the left leg. Nothing makes it better or worse. She has chronic shortness of breath.    Past Medical History  Diagnosis Date  . Hyperlipidemia   . Hypertension   . GI bleed 10/2007    secondary to duodenal ulcers  . History of epistaxis     had MRI by Dr. Carlis Abbott  . Hip fracture 3/09    left, s/p hemiarthroplasty  . Rib fractures     traumatic  . Insomnia   . Osteoporosis     Patient Active Problem List   Diagnosis Date Noted  . Screening for breast cancer 12/22/2010  . Alcohol abuse, episodic 12/22/2010  . Weight loss, non-intentional 12/22/2010  . Tinnitus of left ear 12/22/2010  . Thrombocytopenia 12/22/2010  . Osteoporosis with fracture   . Rib fractures   . History of epistaxis   . GI bleed 10/29/2007    Past Surgical History  Procedure Laterality Date  .  Hemiarthroplasty hip  3/09, 10/11    left hip  . Joint replacement  2009    Left hip    Current Outpatient Rx  Name  Route  Sig  Dispense  Refill  . Cholecalciferol (VITAMIN D PO)   Oral   Take by mouth.           . ciprofloxacin (CIPRO) 500 MG tablet   Oral   Take 500 mg by mouth 2 (two) times daily.          Marland Kitchen gabapentin (NEURONTIN) 100 MG capsule   Oral   Take 100 mg by mouth 2 (two) times daily.          Marland Kitchen gabapentin (NEURONTIN) 300 MG capsule      300 mg 2 (two) times daily.          Marland Kitchen lisinopril (PRINIVIL,ZESTRIL) 40 MG tablet   Oral   Take 1 tablet (40 mg total) by mouth daily. Patient not taking: Reported on 07/01/2014   90 tablet   3   . oxyCODONE-acetaminophen (PERCOCET/ROXICET) 5-325 MG per tablet      1 tablet every 6 (six) hours as needed.            Allergies Aspirin  Family History  Problem Relation Age of Onset  . Cancer Son 40    colon  . Mental illness Mother     Alzheimers Dementia    Social History History  Substance Use Topics  . Smoking status: Former  Smoker    Types: Cigarettes    Quit date: 12/21/1990  . Smokeless tobacco: Never Used  . Alcohol Use: No    Review of Systems  Constitutional: Negative for fever. Eyes: Negative for visual changes. ENT: Negative for sore throat. Cardiovascular: Positive for chest pain. Respiratory: Positive for shortness of breath. Gastrointestinal: Negative for abdominal pain, vomiting and diarrhea. Genitourinary: Negative for dysuria. Musculoskeletal: Positive for back pain. Skin: Negative for rash. Neurological: Negative for headaches, focal weakness or numbness.   10-point ROS otherwise negative.  ____________________________________________    Danley Danker Vitals:   07/01/14 1200  BP: 162/148  Pulse: 102  Temp:   Resp: 21   Filed Vitals:   07/01/14 1122 07/01/14 1130 07/01/14 1200  BP: 157/82 140/83 162/148  Pulse: 99 101 102  Temp: 98 F (36.7 C)    TempSrc: Oral     Resp: 18 21 21   SpO2: 94% 93% 88%   PHYSICAL EXAM:     Constitutional: Alert; chronically ill-appearing but nontoxic;  in no distress. Eyes: Conjunctivae are normal. PERRL. Normal extraocular movements. ENT   Head: Normocephalic and atraumatic.   Nose: No congestion/rhinnorhea.   Mouth/Throat: Mucous membranes are moist.   Neck: No stridor. Hematological/Lymphatic/Immunilogical: No cervical lymphadenopathy. Cardiovascular: Normal rate, regular rhythm. Normal and symmetric distal pulses are present in all extremities. No murmurs, rubs, or gallops. Respiratory: Mild tachypnea,  Breath sounds are clear and equal bilaterally. No wheezes/rales/rhonchi. Gastrointestinal: Soft and nontender. No distention. No abdominal bruits. There is no CVA tenderness. Genitourinary: deferred Musculoskeletal: Nontender with normal range of motion in all extremities. 3+ edema of the left foot, ankle, left lower leg which is erythematous and warm to the touch. Back: Mild midline tenderness throughout the T/L-spine  Neurologic:  Normal speech and language. No gross focal neurologic deficits are appreciated. Speech is normal. No gait instability. Skin:  Skin is warm, dry and intact. No rash noted. Psychiatric: Mood and affect are normal. Speech and behavior are normal. Patient exhibits appropriate insight and judgment.  ____________________________________________    LABS (pertinent positives/negatives)  Notable for mild nonspecific alkaline phosphatase elevation. No leukocytosis  ____________________________________________   EKG  ED ECG REPORT   Date: 07/01/2014  EKG Time: 11:24  Rate: 97  Rhythm: normal sinus rhythm  Axis: left  Intervals:none  ST&T Change: Q waves in lead 2 and 3   ____________________________________________    RADIOLOGY  Cxr: IMPRESSION: No acute cardiopulmonary  abnormality.  ____________________________________________   PROCEDURES  Procedure(s) performed: None  Critical Care performed: Yes, see critical care note(s). Total critical care time spent 45 minutes.  ____________________________________________   INITIAL IMPRESSION / ASSESSMENT AND PLAN / ED COURSE  Pertinent labs & imaging results that were available during my care of the patient were reviewed by me and considered in my medical decision making (see chart for details).  Daina Cara is a 81 y.o. female 52 -year-old female with history of hypertension, hyperlipidemia, chronic alcohol abuse, thrombocytopenia, osteoporosis, and gastroesophageal reflux. She has a history of left hip fracture and right hip fracture in the past from multiple falls. She describes her chest pain as "pins and needles", troponin negative, less consistent with ACS. Back pain is chronic and she had recent imaging at the end of April showing stable compression fractures; no new injuries. My concern is for elevated heart rate and tachypnea as well asleft lower extremity cellulitis which is concerning for sepsis. We'll go gingerly with fluids given advanced age, unknown EF. IV clindamycin ordered. Hospitalist will admit.  [place] pending to r/o DVT. ____________________________________________   FINAL CLINICAL IMPRESSION(S) / ED DIAGNOSES  Final diagnoses:  Sepsis affecting skin  Cellulitis of left lower extremity     Joanne Gavel, MD 07/01/14 1342

## 2014-07-01 NOTE — ED Notes (Signed)
Pt reports a week of chest pain and lower back pain. Pt also reports lower back pain

## 2014-07-01 NOTE — H&P (Signed)
[facility] [place] NAME: Alyssa Ortega    MR#:  [id number]  DATE OF BIRTH:  16-May-1930  DATE OF ADMISSION:  07/01/2014  PRIMARY CARE PHYSICIAN: No PCP Per Patient   REQUESTING/REFERRING PHYSICIAN: Edd Fabian  CHIEF COMPLAINT:  Left lower extremity pain and redness and chest pain when she moves  HISTORY OF PRESENT ILLNESS:  Alyssa Ortega  is a 81 y.o. female with a known history of hypertension, hyperlipidemia, chronic alcohol abuse, osteoporosis and history of left hip fracture and right hip fracture came into the ED with chest pain and left lower extremity pain. Patient was also complaining of acute on chronic low back pain. She denies any new traumas or injuries. EKG in the ED has revealed no acute ST-T wave changes. An patient's initial troponin is negative. Left lower extremity is swollen and erythematous. She is complaining of swelling for the past one week which is getting worse gradually. Lower extremities venous Dopplers are ordered which are pending at this time. Most recent imaging of lower back on 06/23/2014 and has revealed a stable compression fracture. Blood cultures were ordered by the ED physician and patient is started on IV clindamycin for lower extending to cellulitis and hospitalist team is called to admit the patient. Patient was given ciprofloxacin by her physician with no significant improvement in this medicine was started on April 26   PAST MEDICAL HISTORY:   Past Medical History  Diagnosis Date  . Hyperlipidemia   . Hypertension   . GI bleed 10/2007    secondary to duodenal ulcers  . History of epistaxis     had MRI by Dr. Carlis Abbott  . Hip fracture 3/09    left, s/p hemiarthroplasty  . Rib fractures     traumatic  . Insomnia   . Osteoporosis     PAST SURGICAL HISTOIRY:   Past Surgical History  Procedure Laterality Date  . Hemiarthroplasty hip  3/09, 10/11    left hip  . Joint replacement  2009    Left hip     SOCIAL HISTORY:   History  Substance Use Topics  . Smoking status: Former Smoker    Types: Cigarettes    Quit date: 12/21/1990  . Smokeless tobacco: Never Used  . Alcohol Use: No    FAMILY HISTORY:   Family History  Problem Relation Age of Onset  . Cancer Son 40    colon  . Mental illness Mother     Alzheimers Dementia    DRUG ALLERGIES:   Allergies  Allergen Reactions  . Aspirin Hives    REVIEW OF SYSTEMS:  CONSTITUTIONAL: No fever, fatigue or weakness.  EYES: No blurred or double vision.  EARS, NOSE, AND THROAT: No tinnitus or ear pain.  RESPIRATORY: No cough, shortness of breath, wheezing or hemoptysis.  CARDIOVASCULAR: Has reproducible midsternal chest pain. Denies orthopnea, edema.  GASTROINTESTINAL: No nausea, vomiting, diarrhea or abdominal pain.  GENITOURINARY: No dysuria, hematuria.  ENDOCRINE: No polyuria, nocturia,  HEMATOLOGY: No anemia, easy bruising or bleeding SKIN: Left lower extent is erythematous, edematous and tender to touch. The erythema is extending up to middle of the lower leg MUSCULOSKELETAL: No joint pain or arthritis.   NEUROLOGIC: No tingling, numbness, weakness.  PSYCHIATRY: No anxiety or depression.   MEDICATIONS AT HOME:   Prior to Admission medications   Medication Sig Start Date End Date Taking? Authorizing Provider  Cholecalciferol (VITAMIN D PO) Take by mouth.      Historical Provider,  MD  ciprofloxacin (CIPRO) 500 MG tablet Take 500 mg by mouth 2 (two) times daily.  06/23/14   Historical Provider, MD  gabapentin (NEURONTIN) 100 MG capsule Take 100 mg by mouth 2 (two) times daily.  06/19/14   Historical Provider, MD  gabapentin (NEURONTIN) 300 MG capsule 300 mg 2 (two) times daily.  06/05/14   Historical Provider, MD  lisinopril (PRINIVIL,ZESTRIL) 40 MG tablet Take 1 tablet (40 mg total) by mouth daily. Patient not taking: Reported on 07/01/2014 10/25/11   Crecencio Mc, MD  oxyCODONE-acetaminophen (PERCOCET/ROXICET) 5-325 MG per  tablet 1 tablet every 6 (six) hours as needed.  06/23/14   Historical Provider, MD      VITAL SIGNS:  Blood pressure 149/91, pulse 95, temperature 98 F (36.7 C), temperature source Oral, resp. rate 20, SpO2 98 %.  PHYSICAL EXAMINATION:  GENERAL:  81 y.o.-year-old patient lying in the bed with no acute distress.  EYES: Pupils equal, round, reactive to light and accommodation. No scleral icterus. Extraocular muscles intact.  HEENT: Head atraumatic, normocephalic. Oropharynx and nasopharynx clear.  NECK:  Supple, no jugular venous distention. No thyroid enlargement, no tenderness.  LUNGS: Normal breath sounds bilaterally, no wheezing, rales,rhonchi or crepitation. No use of accessory muscles of respiration.  CARDIOVASCULAR: S1, S2 normal. No murmurs, rubs, or gallops. Reproducible anterior chest wall tenderness especially in the midsternal area. ABDOMEN: Soft, nontender, nondistended. Bowel sounds present. No organomegaly or mass.  EXTREMITIES: No pedal edema, cyanosis, or clubbing.  NEUROLOGIC: Cranial nerves II through XII are intact. Muscle strength 5/5 in all extremities. Sensation intact. Gait not checked.  PSYCHIATRIC: The patient is alert and oriented x 3.  SKIN: Large left lower extremity is erythematous, tender, erythematous up to midcalf area  LABORATORY PANEL:   CBC  Recent Labs Lab 07/01/14 1123  WBC 10.1  HGB 13.2  HCT 39.9  PLT 230   ------------------------------------------------------------------------------------------------------------------  Chemistries   Recent Labs Lab 07/01/14 1123  NA 139  K 3.5  CL 101  CO2 29  GLUCOSE 94  BUN 8  CREATININE 0.61  CALCIUM 9.3  AST 24  ALT 13*  ALKPHOS 152*  BILITOT 0.8   ------------------------------------------------------------------------------------------------------------------  Cardiac Enzymes  Recent Labs Lab 07/01/14 1123  TROPONINI <0.03    ------------------------------------------------------------------------------------------------------------------  RADIOLOGY:  Dg Chest Port 1 View  07/01/2014   CLINICAL DATA:  81 year old female with chest and back pain for 1 week. Subsequent encounter.  EXAM: PORTABLE CHEST - 1 VIEW  COMPARISON:  Thoracic spine radiographs 06/21/2014. Chest radiographs 06/28/2013 and earlier.  FINDINGS: Seated AP portable view at noon. Mildly lower lung volumes. Stable cardiac size and mediastinal contours. Allowing for portable technique, the lungs are clear. No pneumothorax. Chronic calcified atherosclerosis of the aorta.  IMPRESSION: No acute cardiopulmonary abnormality.   Electronically Signed   By: Genevie Ann M.D.   On: 07/01/2014 12:21    EKG:   Orders placed or performed during the hospital encounter of 07/01/14  . ED EKG  . ED EKG  . ED EKG (<33mins upon arrival to the ED)  . ED EKG (<63mins upon arrival to the ED)    IMPRESSION AND PLAN:   #1 left lower extremity cellulitis-Will admit the patient to MedSurg unit. Blood cultures were obtained by the ED physician. Lower extremity venous Dopplers are ordered to rule out DVT which are pending at this time. We will continue IV clindamycin. #2 reproducible anterior chest wall pain which is probably from musculoskeletal-Will put her on off unit telemetry  and monitor on telemetry. Initial troponin is negative. We will order another 2 sets of cardiac enzymes to rule out acute MI. We will very unlikely cardiac related. #3 hyperlipidemia-patient is not on any medications. Will check fasting lipid panel. #4 hypertension-will continue her home medication lisinopril and uptitrate as needed basis. #Chronic history of osteoporosis-Will resume her home medication cholecalciferol #5. Will provide GI and DVT prophylaxis with Lovenox subcutaneous and Protonix. She is full code, healthcare medical power of attorney is her son and daughter-in-law Plan of care  discussed with the patient and her family members. They all verbalized understanding of the plan.    All the records are reviewed and case discussed with ED provider. Management plans discussed with the patient, family and they are in agreement.  CODE STATUS: Full code TOTAL TIME TAKING CARE OF THIS PATIENT: 45 minutes.    Nicholes Mango M.D on 07/01/2014 at 2:17 PM  Between 7am to 6pm - Pager - [phone]  After 6pm go to [email or web address] - password [email or web address] [facility]  [place] [facility]  Office  [phone]  CC: Primary care physician; No PCP Per Patient

## 2014-07-01 NOTE — ED Notes (Signed)
Left lower extremity red and edematous

## 2014-07-02 LAB — LIPID PANEL
CHOL/HDL RATIO: 4.5 ratio
Cholesterol: 98 mg/dL (ref 0–200)
HDL: 22 mg/dL — AB (ref >40)
LDL Cholesterol: 59 mg/dL (ref 0–99)
TRIGLYCERIDES: 86 mg/dL (ref <150)
VLDL: 17 mg/dL (ref 0–40)

## 2014-07-02 LAB — BASIC METABOLIC PANEL
ANION GAP: 6 (ref 5–15)
BUN: 8 mg/dL (ref 6–20)
CALCIUM: 8.6 mg/dL — AB (ref 8.9–10.3)
CHLORIDE: 100 mmol/L — AB (ref 101–111)
CO2: 28 mmol/L (ref 22–32)
CREATININE: 0.61 mg/dL (ref 0.44–1.00)
GFR calc Af Amer: >60 mL/min (ref >60)
GFR calc non Af Amer: >60 mL/min (ref >60)
Glucose, Bld: 86 mg/dL (ref 65–99)
Potassium: 3.5 mmol/L (ref 3.5–5.1)
Sodium: 134 mmol/L — ABNORMAL LOW (ref 135–145)

## 2014-07-02 LAB — CBC
HEMATOCRIT: 33.6 % — AB (ref 35.0–47.0)
HEMOGLOBIN: 11.5 g/dL — AB (ref 12.0–16.0)
MCH: 29.4 pg (ref 26.0–34.0)
MCHC: 34.1 g/dL (ref 32.0–36.0)
MCV: 86.1 fL (ref 80.0–100.0)
Platelets: 193 10*3/uL (ref 150–440)
RBC: 3.9 MIL/uL (ref 3.80–5.20)
RDW: 16.4 % — AB (ref 11.5–14.5)
WBC: 8.5 10*3/uL (ref 3.6–11.0)

## 2014-07-02 NOTE — Plan of Care (Signed)
Problem: Consults Goal: Cellulitis Patient Education See Patient Education Module for education specifics.  Outcome: Progressing Discussed with patient and daughter Goal: Nutrition Consult-if indicated Outcome: Not Met (add Reason) Patient and family reports poor appetite. Order for boost added  Problem: Phase I Progression Outcomes Goal: Initial discharge plan identified Outcome: Progressing Patient states that she is to return home alone but would like in home nurse aides to come in and assist.

## 2014-07-02 NOTE — Progress Notes (Signed)
Patient ID: DENAISHA SWANGO, female   DOB: 1930/10/04, 81 y.o.   MRN: [id number] [place] [facility] [place] NAME: Mckenize Mezera    MR#:  [id number]  DATE OF BIRTH:  July 28, 1930  SUBJECTIVE:  Feels better. Left leg redness improved. No sob.   REVIEW OF SYSTEMS:   Review of Systems  Constitutional: Negative for fever, chills and weight loss.  HENT: Negative for hearing loss and tinnitus.   Eyes: Negative for blurred vision and double vision.  Respiratory: Negative for cough, hemoptysis and sputum production.   Cardiovascular: Negative for chest pain, palpitations and orthopnea.  Gastrointestinal: Negative for heartburn, nausea and vomiting.  Genitourinary: Negative for dysuria, urgency and frequency.  Musculoskeletal: Negative for myalgias, back pain, joint pain and neck pain.  Neurological: Positive for weakness. Negative for dizziness, tingling, tremors and focal weakness.  Endo/Heme/Allergies: Does not bruise/bleed easily.  Psychiatric/Behavioral: The patient is not nervous/anxious.      Tolerating PT: pending Tolerating diet: yes  DRUG ALLERGIES:   Allergies  Allergen Reactions  . Aspirin Hives    VITALS:  Blood pressure 119/65, pulse 99, temperature 98.1 F (36.7 C), temperature source Oral, resp. rate 18, height 5' (1.524 m), weight 64.75 kg (142 lb 12 oz), SpO2 92 %.  PHYSICAL EXAMINATION:  GENERAL:  81 y.o.-year-old patient lying in the bed with no acute distress.  EYES: Pupils equal, round, reactive to light and accommodation. No scleral icterus. Extraocular muscles intact.  HEENT: Head atraumatic, normocephalic. Oropharynx and nasopharynx clear.  NECK:  Supple, no jugular venous distention. No thyroid enlargement, no tenderness.  LUNGS: Normal breath sounds bilaterally, no wheezing, rales,rhonchi or crepitation. No use of accessory muscles of respiration.  CARDIOVASCULAR: S1, S2 normal. No murmurs, rubs, or gallops.   ABDOMEN: Soft, nontender, nondistended. Bowel sounds present. No organomegaly or mass.  EXTREMITIES: ++ pedal edema, erythema +, cyanosis, or clubbing.  NEUROLOGIC: Cranial nerves II through XII are intact. Muscle strength 5/5 in all extremities. Sensation intact. Gait not checked.  PSYCHIATRIC: The patient is alert and oriented x 3.  SKIN: No obvious rash, lesion, or ulcer.    LABORATORY PANEL:   CBC  Recent Labs Lab 07/01/14 1700 07/02/14 0429  WBC 10.4 8.5  HGB 12.5 11.5*  HCT 38.4 33.6*  PLT 200 193   ------------------------------------------------------------------------------------------------------------------  Chemistries   Recent Labs Lab 07/01/14 1123 07/01/14 1700 07/02/14 0429  NA 139  --  134*  K 3.5  --  3.5  CL 101  --  100*  CO2 29  --  28  GLUCOSE 94  --  86  BUN 8  --  8  CREATININE 0.61 0.56 0.61  CALCIUM 9.3  --  8.6*  AST 24  --   --   ALT 13*  --   --   ALKPHOS 152*  --   --   BILITOT 0.8  --   --    ------------------------------------------------------------------------------------------------------------------  Cardiac Enzymes  Recent Labs Lab 07/01/14 1700 07/01/14 2033  TROPONINI <0.03 <0.03   ------------------------------------------------------------------------------------------------------------------  RADIOLOGY:  [place]  07/01/2014   CLINICAL DATA:  Status post fall 06/26/2014. Left lower extremity pain and swelling. Question deep venous thrombosis. Initial encounter.  EXAM: LEFT LOWER EXTREMITY VENOUS DOPPLER ULTRASOUND  TECHNIQUE: Gray-scale sonography with graded compression, as well as color Doppler and duplex ultrasound were performed to evaluate the lower extremity deep venous systems from the level of the common femoral vein and including  the common femoral, femoral, profunda femoral, popliteal and calf veins including the posterior tibial, peroneal and gastrocnemius veins when visible. The superficial  great saphenous vein was also interrogated. Spectral Doppler was utilized to evaluate flow at rest and with distal augmentation maneuvers in the common femoral, femoral and popliteal veins.  COMPARISON:  None.  FINDINGS: Contralateral Common Femoral Vein: Respiratory phasicity is normal and symmetric with the symptomatic side. No evidence of thrombus. Normal compressibility.  Common Femoral Vein: No evidence of thrombus. Normal compressibility, respiratory phasicity and response to augmentation.  Saphenofemoral Junction: No evidence of thrombus. Normal compressibility and flow on color Doppler imaging.  Profunda Femoral Vein: No evidence of thrombus. Normal compressibility and flow on color Doppler imaging.  Femoral Vein: No evidence of thrombus. Normal compressibility, respiratory phasicity and response to augmentation.  Popliteal Vein: No evidence of thrombus. Normal compressibility, respiratory phasicity and response to augmentation.  Calf Veins: No evidence of thrombus. Normal compressibility and flow on color Doppler imaging.  Superficial Great Saphenous Vein: No evidence of thrombus. Normal compressibility and flow on color Doppler imaging.  Venous Reflux:  None.  Other Findings:  None.  IMPRESSION: No evidence of deep venous thrombosis.   Electronically Signed   By: Inge Rise M.D.   On: 07/01/2014 14:54   Dg Chest Port 1 View  07/01/2014   CLINICAL DATA:  81 year old female with chest and back pain for 1 week. Subsequent encounter.  EXAM: PORTABLE CHEST - 1 VIEW  COMPARISON:  Thoracic spine radiographs 06/21/2014. Chest radiographs 06/28/2013 and earlier.  FINDINGS: Seated AP portable view at noon. Mildly lower lung volumes. Stable cardiac size and mediastinal contours. Allowing for portable technique, the lungs are clear. No pneumothorax. Chronic calcified atherosclerosis of the aorta.  IMPRESSION: No acute cardiopulmonary abnormality.   Electronically Signed   By: Genevie Ann M.D.   On: 07/01/2014  12:21     ASSESSMENT AND PLAN:   #1 left lower extremity cellulitis - Blood cultures were obtained by the ED physician. - Lower extremity venous Dopplers negative - continue IV clindamycin. -improved redness remarkably  #2 reproducible anterior chest wall pain which is probably from musculoskeletal-Will put her on off unit telemetry and monitor on telemetry. Initial troponin is negative.  -ce neg. Denies cp  #3 hyperlipidemia-patient is not on any medications. fasting lipid panel wnl  #4 hypertension-will continue her home medication lisinopril and uptitrate as needed basis.  #Chronic history of osteoporosis-Will resume her home medication cholecalciferol  #5 DVT prophylaxis with Lovenox subcutaneous and Protonix.  She is full code, healthcare medical power of attorney is her son and daughter-in-law  #6 PT to eval for d/c planning  #7 D/c in am if cont to improve     All the records are reviewed and case discussed with Care Management/Social Workerr. Management plans discussed with the patient, family and they are in agreement.  CODE STATUS: Full  TOTAL TIME TAKING CARE OF THIS PATIENT: 35 mins minutes.   POSSIBLE D/C IN 1 DAYS, DEPENDING ON CLINICAL CONDITION.   Ihan Pat M.D on 07/02/2014 at 2:47 PM  Between 7am to 6pm - Pager - [phone]  After 6pm go to [email or web address] - password [email or web address] [facility]  [place] [facility]  Office  [phone]  CC: Primary care physician; No PCP Per Patient

## 2014-07-02 NOTE — Progress Notes (Signed)
Patient rested well during the night. Medicated for pain with relief evidence by patient sleeping. Tolerating iv antibiotics well. Left foot still red and warm to touch. Patient complaining of right heel pain, so ted hose removed this morning. Voiding on the bedpan. Sinus rhythm on the monitor. Patient free from injury and falls this shift.

## 2014-07-02 NOTE — Clinical Social Work Placement (Signed)
   CLINICAL SOCIAL WORK PLACEMENT  NOTE  Date:  07/02/2014  Patient Details  Name: Alyssa Ortega MRN: [id number] Date of Birth: 12/04/1930  Clinical Social Work is seeking post-discharge placement for this patient at the [place] level of care (*CSW will initial, date and re-position this form in  chart as items are completed):  Yes   Patient/family provided with [place] Work Department's list of facilities offering this level of care within the geographic area requested by the patient (or if unable, by the patient's family).  Yes   Patient/family informed of their freedom to choose among providers that offer the needed level of care, that participate in Medicare, Medicaid or managed care program needed by the patient, have an available bed and are willing to accept the patient.  Yes   Patient/family informed of [facility]'s ownership interest in [facility] and [facility], as well as of the fact that they are under no obligation to receive care at these facilities.  PASRR submitted to EDS on       PASRR number received on       Existing PASRR number confirmed on 07/02/14     FL2 transmitted to all facilities in geographic area requested by pt/family on 07/02/14     FL2 transmitted to all facilities within larger geographic area on       Patient informed that his/her managed care company has contracts with or will negotiate with certain facilities, including the following:            Patient/family informed of bed offers received.  Patient chooses bed at       Physician recommends and patient chooses bed at      Patient to be transferred to   on  .  Patient to be transferred to facility by       Patient family notified on   of transfer.  Name of family member notified:        PHYSICIAN Please sign FL2     Additional Comment:    _______________________________________________ Loralyn Freshwater, LCSW 07/02/2014, 3:33 PM

## 2014-07-02 NOTE — Clinical Social Work Note (Signed)
Clinical Social Work Assessment  Patient Details  Name: Alyssa Ortega MRN: [id number] Date of Birth: [birth year]  Date of referral:  07/02/14               Reason for consult:  Facility Placement                Permission sought to share information with:  Facility Sport and exercise psychologist, Family Supports Permission granted to share information::  Yes, Verbal Permission Granted  Name::      IT sales professional::   [place]   Relationship::     Contact Information:   [facility] admissions coordinator  Housing/Transportation Living arrangements for the past 2 months:  [place] of Information:  Patient, Adult Children Patient Interpreter Needed:  None Criminal Activity/Legal Involvement Pertinent to Current Situation/Hospitalization:  No - Comment as needed Significant Relationships:  Adult Children Lives with:  Self Do you feel safe going back to the place where you live?  Yes Need for family participation in patient care:  Yes (Comment)  Care giving concerns: Patient lives alone.    Social Worker assessment / plan: [place] representative (CSW) met with patient to discuss SNF placement. CSW introduced self and explained role of CSW department. Patient was laying in bed and her son, daughter and daughter in law were at bedside. Patient is alert and oriented. She answered questions appropriately. Patient lives alone in [place]. PT is recommending SNF. CSW discussed SNF option. Patient is agreeable to SNF search in [facility] and prefers [facility] Patient has been to [facility] in the past.  FL2 complete and faxed out. CSW contacted Amy with [facility] and made her aware of above. CSW made patient aware that [facility] will have to approve SNF stay. CSW will continue to follow and assist as needed.   Blima Rich, LCSWA [phone]  Employment status:  Retired Nurse, adult PT Recommendations:   [place] / Referral to community resources:  [place]  Patient/Family's Response to care: Patient and family are agreeable to [facility] and prefer [facility]  Patient/Family's Understanding of and Emotional Response to Diagnosis, Current Treatment, and Prognosis: Patient was pleasant and reported that she would go to rehab in order to recover from the pain.  Emotional Assessment Appearance:  Appears stated age Attitude/Demeanor/Rapport:  Other (Friendly and Appreciative) Affect (typically observed):  Accepting, Appropriate Orientation:  Oriented to Self, Oriented to Place, Oriented to  Time, Oriented to Situation Alcohol / Substance use:  Not Applicable Psych involvement (Current and /or in the community):  No (Comment)  Discharge Needs  Concerns to be addressed:  Financial / Insurance Concerns Readmission within the last 30 days:  No Current discharge risk:  Chronically ill, Lives alone Barriers to Discharge:  Other (Waiting on bed offers from SNF's)   Loralyn Freshwater, LCSW 07/02/2014, 3:36 PM

## 2014-07-02 NOTE — Progress Notes (Signed)
Clinical Education officer, museum (CSW) presented bed offers to patient. Patient chose [facility]. CSW made Amy with [facility] aware of above. CSW will continue to follow and assist as needed.   Blima Rich, [place] [phone]

## 2014-07-02 NOTE — Evaluation (Signed)
Physical Therapy Evaluation Patient Details Name: Alyssa Ortega MRN: [id number] DOB: 28-Sep-1930 Today's Date: 07/02/2014   History of Present Illness  Pt is an 81yo white female who was recently DC from [facility] to Son's home, and is back, with c/o worsening thoracic pain and LLE cellulitis.   Clinical Impression  Pt is A&Ox4 upon entry to room. Pt is moderately anxious and c/o of 8/10 pain, with radiation along the ribs to stabbing pain in the central sternal area. Pt presents with impairment of activity tolerance, pain, and ROM, limiting functional indep in ADL, IADL, and all mobility. Pt will benefit from skilled intervention to restore to PLOF.     Follow Up Recommendations SNF    Equipment Recommendations       Recommendations for Other Services       Precautions / Restrictions Precautions Precautions: Fall Restrictions Weight Bearing Restrictions: No      Mobility  Bed Mobility Overal bed mobility: Needs Assistance Bed Mobility: Sidelying to Sit   Sidelying to sit: Mod assist       General bed mobility comments: Difficulty shifting hips secondary to pain.   Transfers Overall transfer level: Needs assistance Equipment used: Rolling walker (2 wheeled) Transfers: Sit to/from Stand Sit to Stand: Min assist         General transfer comment: General Pain inhibition   Ambulation/Gait Ambulation/Gait assistance: Min guard Ambulation Distance (Feet): 60 Feet Assistive device: Rolling walker (2 wheeled) Gait Pattern/deviations: Decreased step length - left;Decreased step length - right;Antalgic;Trunk flexed   Gait velocity interpretation: <1.8 ft/sec, indicative of risk for recurrent falls    Stairs            Wheelchair Mobility    Modified Rankin (Stroke Patients Only)       Balance Overall balance assessment: No apparent balance deficits (not formally assessed);History of Falls                                            Pertinent Vitals/Pain Pain Assessment: Faces Faces Pain Scale: Hurts whole lot Pain Descriptors / Indicators: Burning;Stabbing Pain Intervention(s): Premedicated before session;Monitored during session    [place] expects to be discharged to:: Private residence Living Arrangements: Alone Available Help at Discharge: Family (Son and D-I-L check in ad lib at baseline. Assist with IADL.  ) Type of Home: House Home Access: Stairs to enter Entrance Stairs-Rails: Right Entrance Stairs-Number of Steps: 3 steps  Home Layout: One level Home Equipment: Walker - 2 wheels;Cane - single point (Pt reports use of SPC in home. Family reports poor compliance with all AD.)      Prior Function                 Hand Dominance        Extremity/Trunk Assessment   Upper Extremity Assessment: Defer to OT evaluation           Lower Extremity Assessment: Overall WFL for tasks assessed      Cervical / Trunk Assessment: Kyphotic (Chronic hyper kyphosis with Hx multi compression fractures. )  Communication      Cognition Arousal/Alertness: Awake/alert Behavior During Therapy: WFL for tasks assessed/performed;Anxious Overall Cognitive Status: Within Functional Limits for tasks assessed                      General Comments      Exercises  Assessment/Plan    PT Assessment Patient needs continued PT services  PT Diagnosis Difficulty walking;Acute pain   PT Problem List Decreased knowledge of use of DME;Decreased range of motion;Decreased activity tolerance;Decreased mobility  PT Treatment Interventions DME instruction;Gait training;Stair training;Functional mobility training;Therapeutic activities;Patient/family education;Balance training   PT Goals (Current goals can be found in the Care Plan section) Acute Rehab PT Goals Patient Stated Goal: Decrease Pain  PT Goal Formulation: With patient/family Time For Goal Achievement:  07/16/14 Potential to Achieve Goals: Good    Frequency Min 2X/week   Barriers to discharge Inaccessible home environment (Patien tunable to access home indep. )      Co-evaluation               End of Session Equipment Utilized During Treatment: Gait belt Activity Tolerance: No increased pain;Patient limited by pain Patient left: with family/visitor present;Other (comment) (BSC with daughter in law; NA notified. ) Nurse Communication: Mobility status;Other (comment)         Time: 9758-8325 PT Time Calculation (min) (ACUTE ONLY): 31 min   Charges:   PT Evaluation $Initial PT Evaluation Tier I: 1 Procedure PT Treatments $Therapeutic Activity: 23-37 mins   PT G Codes:        Buccola,Allan C 07/18/14, 3:07 PM  Etta Grandchild, PT, DPT, BM

## 2014-07-02 NOTE — Progress Notes (Signed)
RD Assessment  Admitted with: back pain PMHx: HLD, HTN, GIB  Current Diet: 2gm Na Typical Food/ Fluid Intake: 100% of meals recorded per I/O; lunch at bedside 100% eaten Meal/ Snack Patterns: Patient reports cooking her own regular meals with no restrictions. She reports a good intake PTA. Patient reports drinking Ensure at home because "she likes it".  Supplements: Ensure BID  Food Allergies: NKFA Food Preferences: Reviewed  Ht: 64" (as stated per patient) Current weight: 142# BMI: 24.4 Weight Changes: Patient reports a UBW of 135-138#. Current weight represents a 4-7# weight gain- not signficant; per MST, patient reported a 2-13# weight loss. Pt unable to clarify this for me.  UOP: Reviewed Digestive: Reviewed Gastrointestinal: Reviewed Skin: No pressure ulcers Physical Findings: Reviewed  Labs:  Electrolyte and Renal Profile:    Recent Labs Lab 07/01/14 1123 07/01/14 1700 07/02/14 0429  BUN 8  --  8  CREATININE 0.61 0.56 0.61  NA 139  --  134*  K 3.5  --  3.5   Protein Profile:  Recent Labs Lab 07/01/14 1123  ALBUMIN 3.9     Meds: Vit d, Colace  PES Statement: No nutrition concerns at this time.  Diagnosis:  Intervention: Meals and Snacks: Cater to patient preferences Medical Nutrition Supplement: Continue Ensure Enlive BID for additional nutrition- pt states that she likes it and drinks it at home.  Monitoring/ Evaluation:  Energy Intake: goal for patient to meet >90% of estimated needs. Electrolyte and renal Profile  Roda Shutters, RDN Pager: [phone] Office: [place]

## 2014-07-02 NOTE — Care Management Note (Signed)
Case Management Note  Patient Details  Name: Alyssa Ortega MRN: [id number] Date of Birth: 27-Oct-1930  Subjective/Objective:                  Patient sitting in bed; family (including son Alyssa Ortega) at bedside. Patient from home alone; supportive family. Patient refused SNF but agreed to home health and wants [facility]. She has a 4-point cane, single point cane, and walker available at home. She usually uses one of the canes. O2 is new. She uses [facility] [place]. PCP is Dr. Luan Pulling.   Action/Plan: Home health referral called to [facility]- patient requesting "[facility]" with PT. RNCM will continue to follow.   Expected Discharge Date:                  Expected Discharge Plan:  [place]  In-House Referral:     Discharge planning Services  CM Consult  Post Acute Care Choice:    Choice offered to:  Patient, Adult Children  DME Arranged:    DME Agency:     HH Arranged:  PT [place]:  [facility]  Status of Service:     Medicare Important Message Given:    Date Medicare IM Given:    Medicare IM give by:    Date Additional Medicare IM Given:    Additional Medicare Important Message give by:     If discussed at [place] of Stay Meetings, dates discussed:    Additional Comments:  Marshell Garfinkel, RN 07/02/2014, 1:24 PM

## 2014-07-03 ENCOUNTER — Encounter
Admission: RE | Admit: 2014-07-03 | Discharge: 2014-07-03 | Disposition: A | Discharge status: Home / Self Care | Payer: Commercial Managed Care - HMO | Source: Ambulatory Visit | Attending: Internal Medicine | Admitting: Internal Medicine

## 2014-07-03 ENCOUNTER — Ambulatory Visit
Admission: RE | Admit: 2014-07-03 | Discharge: 2014-07-03 | Disposition: A | Discharge status: Home / Self Care | Payer: Commercial Managed Care - HMO | Source: Ambulatory Visit | Attending: Internal Medicine | Admitting: Internal Medicine

## 2014-07-03 ENCOUNTER — Other Ambulatory Visit: Payer: Self-pay | Admitting: Gerontology

## 2014-07-03 ENCOUNTER — Ambulatory Visit
Admission: RE | Admit: 2014-07-03 | Discharge: 2014-07-03 | Disposition: A | Discharge status: Home / Self Care | Payer: Commercial Managed Care - HMO | Source: Ambulatory Visit | Attending: Gerontology | Admitting: Gerontology

## 2014-07-03 DIAGNOSIS — E876 Hypokalemia: Secondary | ICD-10-CM | POA: Insufficient documentation

## 2014-07-03 DIAGNOSIS — R918 Other nonspecific abnormal finding of lung field: Secondary | ICD-10-CM | POA: Insufficient documentation

## 2014-07-03 DIAGNOSIS — R071 Chest pain on breathing: Secondary | ICD-10-CM

## 2014-07-03 DIAGNOSIS — R0682 Tachypnea, not elsewhere classified: Secondary | ICD-10-CM

## 2014-07-03 DIAGNOSIS — G9520 Unspecified cord compression: Secondary | ICD-10-CM | POA: Diagnosis not present

## 2014-07-03 DIAGNOSIS — R000 Tachycardia, unspecified: Secondary | ICD-10-CM | POA: Insufficient documentation

## 2014-07-03 DIAGNOSIS — D72829 Elevated white blood cell count, unspecified: Secondary | ICD-10-CM | POA: Insufficient documentation

## 2014-07-03 DIAGNOSIS — R339 Retention of urine, unspecified: Secondary | ICD-10-CM | POA: Insufficient documentation

## 2014-07-03 DIAGNOSIS — R8290 Unspecified abnormal findings in urine: Secondary | ICD-10-CM | POA: Insufficient documentation

## 2014-07-03 DIAGNOSIS — R0602 Shortness of breath: Secondary | ICD-10-CM

## 2014-07-03 DIAGNOSIS — L03116 Cellulitis of left lower limb: Secondary | ICD-10-CM | POA: Insufficient documentation

## 2014-07-03 LAB — CBC WITH DIFFERENTIAL/PLATELET
Basophils Absolute: 0 10*3/uL (ref 0–0.1)
Basophils Relative: 0 %
EOS ABS: 0 10*3/uL (ref 0–0.7)
Eosinophils Relative: 0 %
HCT: 35.6 % (ref 35.0–47.0)
HEMOGLOBIN: 11.8 g/dL — AB (ref 12.0–16.0)
Lymphocytes Relative: 5 %
Lymphs Abs: 0.8 10*3/uL — ABNORMAL LOW (ref 1.0–3.6)
MCH: 28.5 pg (ref 26.0–34.0)
MCHC: 33.2 g/dL (ref 32.0–36.0)
MCV: 85.7 fL (ref 80.0–100.0)
Monocytes Absolute: 0.9 10*3/uL (ref 0.2–0.9)
Monocytes Relative: 6 %
NEUTROS PCT: 89 %
Neutro Abs: 14.2 10*3/uL — ABNORMAL HIGH (ref 1.4–6.5)
Platelets: 194 10*3/uL (ref 150–440)
RBC: 4.15 MIL/uL (ref 3.80–5.20)
RDW: 16.7 % — AB (ref 11.5–14.5)
WBC: 16 10*3/uL — ABNORMAL HIGH (ref 3.6–11.0)

## 2014-07-03 LAB — COMPREHENSIVE METABOLIC PANEL
ALBUMIN: 3 g/dL — AB (ref 3.5–5.0)
ALK PHOS: 143 U/L — AB (ref 38–126)
ALT: 8 U/L — AB (ref 14–54)
AST: 21 U/L (ref 15–41)
Anion gap: 11 (ref 5–15)
BUN: 7 mg/dL (ref 6–20)
CALCIUM: 8.9 mg/dL (ref 8.9–10.3)
CO2: 26 mmol/L (ref 22–32)
Chloride: 96 mmol/L — ABNORMAL LOW (ref 101–111)
Creatinine, Ser: 0.56 mg/dL (ref 0.44–1.00)
GFR calc Af Amer: >60 mL/min (ref >60)
GFR calc non Af Amer: >60 mL/min (ref >60)
Glucose, Bld: 101 mg/dL — ABNORMAL HIGH (ref 65–99)
POTASSIUM: 3.1 mmol/L — AB (ref 3.5–5.1)
SODIUM: 133 mmol/L — AB (ref 135–145)
TOTAL PROTEIN: 6.6 g/dL (ref 6.5–8.1)
Total Bilirubin: 0.5 mg/dL (ref 0.3–1.2)

## 2014-07-03 LAB — MAGNESIUM: Magnesium: 1.7 mg/dL (ref 1.7–2.4)

## 2014-07-03 LAB — VITAMIN B12: Vitamin B-12: 455 pg/mL (ref 180–914)

## 2014-07-03 LAB — FOLATE: Folate: 7.1 ng/mL (ref >5.9)

## 2014-07-03 LAB — TSH: TSH: 1.314 u[IU]/mL (ref 0.350–4.500)

## 2014-07-03 MED ORDER — CLINDAMYCIN HCL 300 MG PO CAPS: 300.0000 mg | ORAL_CAPSULE | Freq: Three times a day (TID) | ORAL | Status: AC

## 2014-07-03 MED ORDER — ACETAMINOPHEN 325 MG PO TABS: 650.0000 mg | ORAL_TABLET | Freq: Four times a day (QID) | ORAL | Status: DC | PRN

## 2014-07-03 MED ORDER — DOCUSATE SODIUM 100 MG PO CAPS: 100.0000 mg | ORAL_CAPSULE | Freq: Two times a day (BID) | ORAL | Status: DC

## 2014-07-03 MED ORDER — CLINDAMYCIN HCL 150 MG PO CAPS
300.0000 mg | ORAL_CAPSULE | Freq: Three times a day (TID) | ORAL | Status: DC
Start: 2014-07-03 — End: 2014-07-03
  Administered 2014-07-03: 300 mg via ORAL
  Filled 2014-07-03: qty 2

## 2014-07-03 MED ORDER — IOHEXOL 350 MG/ML SOLN
80.0000 mL | Freq: Once | INTRAVENOUS | Status: AC | PRN
  Administered 2014-07-03: 80 mL via INTRAVENOUS

## 2014-07-03 MED ORDER — ENSURE ENLIVE PO LIQD: 237.0000 mL | Freq: Two times a day (BID) | ORAL | Status: AC

## 2014-07-03 MED ORDER — OXYCODONE-ACETAMINOPHEN 5-325 MG PO TABS: 1.0000 | ORAL_TABLET | Freq: Four times a day (QID) | ORAL | Status: DC | PRN

## 2014-07-03 NOTE — Care Management Note (Signed)
Case Management Note  Patient Details  Name: Alyssa Ortega MRN: [id number] Date of Birth: 1930/03/07  [facility] notified of patient discharge today to SNF- [facility]. Marshell Garfinkel, RN 07/03/2014, 11:11 AM

## 2014-07-03 NOTE — Progress Notes (Signed)
Patient is medically stable for D/C to [facility] today Per Kim admissions coordinator at [facility] patient is going to room 215-A. RN will call report at [phone] and arrange EMS for transport. Clinical Education officer, museum (CSW) prepared D/C packet and sent D/C Summary to  [facility] via [employer]. Patient's son, daughter, and daughter in law were at bedside and aware of above. [facility] authorization has been received. Auth # [id number]. Please reconsult if future social work needs arise. CSW signing off.   Blima Rich, [place] [phone]

## 2014-07-03 NOTE — Outcomes Assessment (Signed)
VSS, patient is alert but confused at times and trys to pull of tele monitor.  Denies pain at this time.  Voids without difficulty.  Running SR on tele. Appears to have slept well.

## 2014-07-03 NOTE — Progress Notes (Signed)
Patient being dc'd to [facility], report given to Palma Holter at [facility], IV dc'd, discharge paper dicussed with patient, package will be given to EMS

## 2014-07-03 NOTE — Discharge Summary (Signed)
[place] [facility] [place] NAME: Alyssa Ortega    MR#:  [id number]  DATE OF BIRTH:  [birth year]  DATE OF ADMISSION:  07/01/2014 ADMITTING PHYSICIAN: Nicholes Mango, MD  DATE OF DISCHARGE: 07/03/14  PRIMARY CARE PHYSICIAN: No PCP Per Patient    ADMISSION DIAGNOSIS:  Swelling [R60.9] Cellulitis of left lower extremity [H47.425]   DISCHARGE DIAGNOSIS:  Left LE cellulitis   SECONDARY DIAGNOSIS:   Past Medical History  Diagnosis Date  . Hyperlipidemia   . Hypertension   . GI bleed 10/2007    secondary to duodenal ulcers  . History of epistaxis     had MRI by Dr. Carlis Abbott  . Hip fracture 3/09    left, s/p hemiarthroplasty  . Rib fractures     traumatic  . Insomnia   . Osteoporosis     HOSPITAL COURSE:   #1 left lower extremity cellulitis - Blood cultures remains negative - Lower extremity venous Dopplers negative - continue po clindamycin-7 days -improved redness remarkably  #2 reproducible anterior chest wall pain which is probably from musculoskeletal -ce neg. Denies cp  #3 hyperlipidemia-patient is not on any medications. fasting lipid panel wnl  #4 hypertension - lisinopril   #Chronic history of osteoporosis-Will resume her home medication cholecalciferol -chronic back pain. Prn percocet  #5 Received  DVT prophylaxis with Lovenox   She is full code, healthcare medical power of attorney is her son and daughter-in-law  #6 PT to eval recommends rehab. To [facility]  DISCHARGE CONDITIONS:   [place]  CONSULTS OBTAINED:    none DRUG ALLERGIES:   Allergies  Allergen Reactions  . Aspirin Hives    DISCHARGE MEDICATIONS:   Current Discharge Medication List    START taking these medications   Details  acetaminophen (TYLENOL) 325 MG tablet Take 2 tablets (650 mg total) by mouth every 6 (six) hours as needed for mild pain (or Fever >/= 101). Qty: 30 tablet, Refills: 0    clindamycin (CLEOCIN) 300 MG capsule  Take 1 capsule (300 mg total) by mouth every 8 (eight) hours. Qty: 15 capsule, Refills: 0    docusate sodium (COLACE) 100 MG capsule Take 1 capsule (100 mg total) by mouth 2 (two) times daily. Qty: 10 capsule, Refills: 0    feeding supplement, ENSURE ENLIVE, (ENSURE ENLIVE) LIQD Take 237 mLs by mouth 2 (two) times daily between meals. Qty: 237 mL, Refills: 12      CONTINUE these medications which have NOT CHANGED   Details  Cholecalciferol (VITAMIN D PO) Take by mouth.      gabapentin (NEURONTIN) 300 MG capsule 300 mg 2 (two) times daily.     lisinopril (PRINIVIL,ZESTRIL) 40 MG tablet Take 1 tablet (40 mg total) by mouth daily. Qty: 90 tablet, Refills: 3    oxyCODONE-acetaminophen (PERCOCET/ROXICET) 5-325 MG per tablet 1 tablet every 6 (six) hours as needed.       STOP taking these medications     ciprofloxacin (CIPRO) 500 MG tablet          DISCHARGE INSTRUCTIONS:   2 gram sodium diet PT  If you experience worsening of your admission symptoms, develop shortness of breath, life threatening emergency, suicidal or homicidal thoughts you must seek medical attention immediately by calling 911 or calling your MD immediately  if symptoms less severe.  You Must read complete instructions/literature along with all the possible adverse reactions/side effects for all the Medicines you take and that have been prescribed to you. Take  any new Medicines after you have completely understood and accept all the possible adverse reactions/side effects.   Please note  You were cared for by a hospitalist during your hospital stay. If you have any questions about your discharge medications or the care you received while you were in the hospital after you are discharged, you can call the unit and asked to speak with the hospitalist on call if the hospitalist that took care of you is not available. Once you are discharged, your primary care physician will handle any further medical issues. Please  note that NO REFILLS for any discharge medications will be authorized once you are discharged, as it is imperative that you return to your primary care physician (or establish a relationship with a primary care physician if you do not have one) for your aftercare needs so that they can reassess your need for medications and monitor your lab values.    Today   SUBJECTIVE  feeling better. Low grade temp this am. Left LE rednees reoslved,. Leg edema much improved  VITAL SIGNS:  Blood pressure 165/70, pulse 102, temperature 100.6 F (38.1 C), temperature source Oral, resp. rate 20, height 5' (1.524 m), weight 64.75 kg (142 lb 12 oz), SpO2 99 %.  I/O:   Intake/Output Summary (Last 24 hours) at 07/03/14 0954 Last data filed at 07/03/14 0700  Gross per 24 hour  Intake    590 ml  Output    975 ml  Net   -385 ml    PHYSICAL EXAMINATION:  GENERAL:  81 y.o.-year-old patient lying in the bed with no acute distress.  EYES: Pupils equal, round, reactive to light and accommodation. No scleral icterus. Extraocular muscles intact.  HEENT: Head atraumatic, normocephalic. Oropharynx and nasopharynx clear.  NECK:  Supple, no jugular venous distention. No thyroid enlargement, no tenderness.  LUNGS: Normal breath sounds bilaterally, no wheezing, rales,rhonchi or crepitation. No use of accessory muscles of respiration.  CARDIOVASCULAR: S1, S2 normal. No murmurs, rubs, or gallops.  ABDOMEN: Soft, non-tender, non-distended. Bowel sounds present. No organomegaly or mass.  EXTREMITIES: + pedal edema, cyanosis, or clubbing. Redness improved in LEft LE NEUROLOGIC: Cranial nerves II through XII are intact. Muscle strength 5/5 in all extremities. Sensation intact. Gait not checked.  PSYCHIATRIC: The patient is alert and oriented x 3.  SKIN: No obvious  lesion, or ulcer.   DATA REVIEW:   CBC  Recent Labs Lab 07/02/14 0429  WBC 8.5  HGB 11.5*  HCT 33.6*  PLT 193    Chemistries   Recent Labs Lab  07/01/14 1123  07/02/14 0429  NA 139  --  134*  K 3.5  --  3.5  CL 101  --  100*  CO2 29  --  28  GLUCOSE 94  --  86  BUN 8  --  8  CREATININE 0.61  < > 0.61  CALCIUM 9.3  --  8.6*  AST 24  --   --   ALT 13*  --   --   ALKPHOS 152*  --   --   BILITOT 0.8  --   --   < > = values in this interval not displayed.  Cardiac Enzymes  Recent Labs Lab 07/01/14 2033  TROPONINI <0.03    Microbiology Results  Results for orders placed or performed during the hospital encounter of 07/01/14  Blood culture (routine x 2)     Status: None (Preliminary result)   Collection Time: 07/01/14  2:11 PM  Result  Value Ref Range Status   Specimen Description BLOOD  Final   Special Requests BOTTLES DRAWN AEROBIC AND ANAEROBIC  Final   Culture NO GROWTH 2 DAYS  Final   Report Status PENDING  Incomplete  Blood culture (routine x 2)     Status: None (Preliminary result)   Collection Time: 07/01/14  2:11 PM  Result Value Ref Range Status   Specimen Description BLOOD  Final   Special Requests BOTTLES DRAWN AEROBIC AND ANAEROBIC  Final   Culture NO GROWTH 2 DAYS  Final   Report Status PENDING  Incomplete    RADIOLOGY:  [place] Extrem Low Left Ltd  07/01/2014   CLINICAL DATA:  Status post fall 06/26/2014. Left lower extremity pain and swelling. Question deep venous thrombosis. Initial encounter.  EXAM: LEFT LOWER EXTREMITY VENOUS DOPPLER ULTRASOUND  TECHNIQUE: Gray-scale sonography with graded compression, as well as color Doppler and duplex ultrasound were performed to evaluate the lower extremity deep venous systems from the level of the common femoral vein and including the common femoral, femoral, profunda femoral, popliteal and calf veins including the posterior tibial, peroneal and gastrocnemius veins when visible. The superficial great saphenous vein was also interrogated. Spectral Doppler was utilized to evaluate flow at rest and with distal augmentation maneuvers in the common femoral, femoral and  popliteal veins.  COMPARISON:  None.  FINDINGS: Contralateral Common Femoral Vein: Respiratory phasicity is normal and symmetric with the symptomatic side. No evidence of thrombus. Normal compressibility.  Common Femoral Vein: No evidence of thrombus. Normal compressibility, respiratory phasicity and response to augmentation.  Saphenofemoral Junction: No evidence of thrombus. Normal compressibility and flow on color Doppler imaging.  Profunda Femoral Vein: No evidence of thrombus. Normal compressibility and flow on color Doppler imaging.  Femoral Vein: No evidence of thrombus. Normal compressibility, respiratory phasicity and response to augmentation.  Popliteal Vein: No evidence of thrombus. Normal compressibility, respiratory phasicity and response to augmentation.  Calf Veins: No evidence of thrombus. Normal compressibility and flow on color Doppler imaging.  Superficial Great Saphenous Vein: No evidence of thrombus. Normal compressibility and flow on color Doppler imaging.  Venous Reflux:  None.  Other Findings:  None.  IMPRESSION: No evidence of deep venous thrombosis.   Electronically Signed   By: Inge Rise M.D.   On: 07/01/2014 14:54   Dg Chest Port 1 View  07/01/2014   CLINICAL DATA:  81 year old female with chest and back pain for 1 week. Subsequent encounter.  EXAM: PORTABLE CHEST - 1 VIEW  COMPARISON:  Thoracic spine radiographs 06/21/2014. Chest radiographs 06/28/2013 and earlier.  FINDINGS: Seated AP portable view at noon. Mildly lower lung volumes. Stable cardiac size and mediastinal contours. Allowing for portable technique, the lungs are clear. No pneumothorax. Chronic calcified atherosclerosis of the aorta.  IMPRESSION: No acute cardiopulmonary abnormality.   Electronically Signed   By: Genevie Ann M.D.   On: 07/01/2014 12:21  Management plans discussed with the patient, family and they are in agreement.  CODE STATUS:     Code Status Orders        Start     Ordered   07/01/14 1642   Full code   Continuous     07/01/14 1641      TOTAL TIME TAKING CARE OF THIS PATIENT: 35 minutes.    Neysa Arts M.D on 07/03/2014 at 9:54 AM  Between 7am to 6pm - Pager - [phone] After 6pm go to [email or web address] - [facility]  Office  [phone]  CC: Primary care physician; No PCP Per Patient

## 2014-07-03 NOTE — Clinical Social Work Placement (Signed)
   CLINICAL SOCIAL WORK PLACEMENT  NOTE  Date:  07/03/2014  Patient Details  Name: [name] MRN: [id number] Date of Birth: 1930-04-13  Clinical Social Work is seeking post-discharge placement for this patient at the [place] level of care (*CSW will initial, date and re-position this form in  chart as items are completed):  Yes   Patient/family provided with [facility] Work Department's list of facilities offering this level of care within the geographic area requested by the patient (or if unable, by the patient's family).  Yes   Patient/family informed of their freedom to choose among providers that offer the needed level of care, that participate in Medicare, Medicaid or managed care program needed by the patient, have an available bed and are willing to accept the patient.  Yes   Patient/family informed of [facility]'s ownership interest in [facility] and [facility], as well as of the fact that they are under no obligation to receive care at these facilities.  PASRR submitted to EDS on       PASRR number received on       Existing PASRR number confirmed on 07/02/14     FL2 transmitted to all facilities in geographic area requested by pt/family on 07/02/14     FL2 transmitted to all facilities within larger geographic area on       Patient informed that his/her managed care company has contracts with or will negotiate with certain facilities, including the following:            Patient/family informed of bed offers received.  Patient chooses bed at    [facility]   Physician recommends and patient chooses bed at      Patient to be transferred to   [facility] on   07/03/14.  Patient to be transferred to facility by  [facility]      Patient family notified on  07/03/14 of transfer.  Name of family member notified:    Jaquelyn Bitter son at bedside.      PHYSICIAN Please sign FL2     Additional Comment: [facility]  authorization received. Auth # [id number].    _______________________________________________ Loralyn Freshwater, LCSW 07/03/2014, 11:14 AM

## 2014-07-03 NOTE — Op Note (Signed)
Dr. Posey Pronto notified concerning slight confusion possibly due to pain medication, at this moment patient verbalizes the year, month and location and next holiday, her oxgen sats is 96% on room air. [place] and EMS was present when patient answered all questions appropiately as well  charge nurse([place]).

## 2014-07-03 NOTE — Discharge Instructions (Signed)
2 gram sodium diet PT at [facility] Stop drinking alcohol

## 2014-07-03 NOTE — Op Note (Signed)
Dr. Posey Pronto informed [place] to send patient to [facility] after notifying her about confusing.

## 2014-07-04 DIAGNOSIS — D72829 Elevated white blood cell count, unspecified: Secondary | ICD-10-CM | POA: Diagnosis not present

## 2014-07-04 DIAGNOSIS — R8290 Unspecified abnormal findings in urine: Secondary | ICD-10-CM | POA: Diagnosis not present

## 2014-07-04 DIAGNOSIS — L03116 Cellulitis of left lower limb: Secondary | ICD-10-CM | POA: Diagnosis not present

## 2014-07-04 DIAGNOSIS — R339 Retention of urine, unspecified: Secondary | ICD-10-CM | POA: Diagnosis not present

## 2014-07-04 DIAGNOSIS — E876 Hypokalemia: Secondary | ICD-10-CM | POA: Diagnosis present

## 2014-07-04 LAB — VITAMIN D 25 HYDROXY (VIT D DEFICIENCY, FRACTURES): Vit D, 25-Hydroxy: 14.3 ng/mL — ABNORMAL LOW (ref 30.0–100.0)

## 2014-07-05 LAB — URINALYSIS COMPLETE WITH MICROSCOPIC (ARMC ONLY)
BILIRUBIN URINE: NEGATIVE
Bacteria, UA: NONE SEEN
Glucose, UA: NEGATIVE mg/dL
Hgb urine dipstick: NEGATIVE
Leukocytes, UA: NEGATIVE
Nitrite: NEGATIVE
PH: 5 (ref 5.0–8.0)
Protein, ur: NEGATIVE mg/dL
SPECIFIC GRAVITY, URINE: 1.033 — AB (ref 1.005–1.030)

## 2014-07-05 LAB — C DIFFICILE QUICK SCREEN W PCR REFLEX
C DIFFICILE (CDIFF) TOXIN: POSITIVE
C Diff antigen: POSITIVE
C Diff interpretation: POSITIVE

## 2014-07-06 DIAGNOSIS — E876 Hypokalemia: Secondary | ICD-10-CM | POA: Diagnosis not present

## 2014-07-06 LAB — URINALYSIS COMPLETE WITH MICROSCOPIC (ARMC ONLY)
Bilirubin Urine: NEGATIVE
Glucose, UA: NEGATIVE mg/dL
HGB URINE DIPSTICK: NEGATIVE
KETONES UR: NEGATIVE mg/dL
LEUKOCYTES UA: NEGATIVE
Nitrite: NEGATIVE
PH: 5 (ref 5.0–8.0)
Protein, ur: NEGATIVE mg/dL
Specific Gravity, Urine: 1.023 (ref 1.005–1.030)

## 2014-07-06 LAB — BASIC METABOLIC PANEL
ANION GAP: 11 (ref 5–15)
BUN: 32 mg/dL — AB (ref 6–20)
CALCIUM: 8.2 mg/dL — AB (ref 8.9–10.3)
CHLORIDE: 95 mmol/L — AB (ref 101–111)
CO2: 26 mmol/L (ref 22–32)
CREATININE: 1.24 mg/dL — AB (ref 0.44–1.00)
GFR calc Af Amer: 46 mL/min — ABNORMAL LOW (ref >60)
GFR calc non Af Amer: 40 mL/min — ABNORMAL LOW (ref >60)
Glucose, Bld: 74 mg/dL (ref 65–99)
Potassium: 4.3 mmol/L (ref 3.5–5.1)
Sodium: 132 mmol/L — ABNORMAL LOW (ref 135–145)

## 2014-07-06 LAB — CULTURE, BLOOD (ROUTINE X 2)
CULTURE: NO GROWTH
CULTURE: NO GROWTH

## 2014-07-06 LAB — CBC
HCT: 33.8 % — ABNORMAL LOW (ref 35.0–47.0)
Hemoglobin: 11.2 g/dL — ABNORMAL LOW (ref 12.0–16.0)
MCH: 28.8 pg (ref 26.0–34.0)
MCHC: 33.1 g/dL (ref 32.0–36.0)
MCV: 87 fL (ref 80.0–100.0)
PLATELETS: 205 10*3/uL (ref 150–440)
RBC: 3.88 MIL/uL (ref 3.80–5.20)
RDW: 17.1 % — ABNORMAL HIGH (ref 11.5–14.5)
WBC: 14.5 10*3/uL — ABNORMAL HIGH (ref 3.6–11.0)

## 2014-07-07 ENCOUNTER — Other Ambulatory Visit: Payer: Self-pay | Admitting: Internal Medicine

## 2014-07-07 DIAGNOSIS — R1900 Intra-abdominal and pelvic swelling, mass and lump, unspecified site: Secondary | ICD-10-CM

## 2014-07-07 LAB — URINE CULTURE: CULTURE: NO GROWTH

## 2014-07-10 DIAGNOSIS — E876 Hypokalemia: Secondary | ICD-10-CM | POA: Diagnosis not present

## 2014-07-10 LAB — CBC WITH DIFFERENTIAL/PLATELET
BASOS ABS: 0.1 10*3/uL (ref 0–0.1)
BASOS PCT: 1 %
EOS ABS: 0.1 10*3/uL (ref 0–0.7)
Eosinophils Relative: 1 %
HCT: 34.3 % — ABNORMAL LOW (ref 35.0–47.0)
Hemoglobin: 11.3 g/dL — ABNORMAL LOW (ref 12.0–16.0)
LYMPHS ABS: 0.7 10*3/uL — AB (ref 1.0–3.6)
Lymphocytes Relative: 6 %
MCH: 28 pg (ref 26.0–34.0)
MCHC: 33 g/dL (ref 32.0–36.0)
MCV: 84.9 fL (ref 80.0–100.0)
MONOS PCT: 7 %
Monocytes Absolute: 0.8 10*3/uL (ref 0.2–0.9)
NEUTROS ABS: 10.1 10*3/uL — AB (ref 1.4–6.5)
NEUTROS PCT: 85 %
Platelets: 255 10*3/uL (ref 150–440)
RBC: 4.05 MIL/uL (ref 3.80–5.20)
RDW: 16.3 % — ABNORMAL HIGH (ref 11.5–14.5)
WBC: 11.7 10*3/uL — ABNORMAL HIGH (ref 3.6–11.0)

## 2014-07-10 LAB — COMPREHENSIVE METABOLIC PANEL
ALBUMIN: 2.4 g/dL — AB (ref 3.5–5.0)
ALK PHOS: 113 U/L (ref 38–126)
ALT: 9 U/L — ABNORMAL LOW (ref 14–54)
AST: 18 U/L (ref 15–41)
Anion gap: 8 (ref 5–15)
BILIRUBIN TOTAL: 0.3 mg/dL (ref 0.3–1.2)
BUN: <5 mg/dL — ABNORMAL LOW (ref 6–20)
CHLORIDE: 95 mmol/L — AB (ref 101–111)
CO2: 26 mmol/L (ref 22–32)
Calcium: 8.1 mg/dL — ABNORMAL LOW (ref 8.9–10.3)
Glucose, Bld: 121 mg/dL — ABNORMAL HIGH (ref 65–99)
POTASSIUM: 3.3 mmol/L — AB (ref 3.5–5.1)
SODIUM: 129 mmol/L — AB (ref 135–145)
TOTAL PROTEIN: 5.6 g/dL — AB (ref 6.5–8.1)

## 2014-07-10 LAB — URINE CULTURE: Culture: 40000

## 2014-07-14 ENCOUNTER — Encounter
Admission: RE | Admit: 2014-07-14 | Discharge: 2014-07-14 | Disposition: A | Discharge status: Home / Self Care | Payer: Commercial Managed Care - HMO | Source: Skilled Nursing Facility | Attending: Internal Medicine | Admitting: Internal Medicine

## 2014-07-14 ENCOUNTER — Other Ambulatory Visit: Payer: Self-pay | Admitting: Gerontology

## 2014-07-14 DIAGNOSIS — M542 Cervicalgia: Secondary | ICD-10-CM

## 2014-07-14 DIAGNOSIS — E876 Hypokalemia: Secondary | ICD-10-CM | POA: Diagnosis not present

## 2014-07-14 DIAGNOSIS — M436 Torticollis: Secondary | ICD-10-CM

## 2014-07-14 LAB — COMPREHENSIVE METABOLIC PANEL
ALBUMIN: 2.4 g/dL — AB (ref 3.5–5.0)
ALT: 7 U/L — AB (ref 14–54)
ANION GAP: 9 (ref 5–15)
AST: 16 U/L (ref 15–41)
Alkaline Phosphatase: 95 U/L (ref 38–126)
BILIRUBIN TOTAL: 0.3 mg/dL (ref 0.3–1.2)
BUN: 6 mg/dL (ref 6–20)
CO2: 32 mmol/L (ref 22–32)
Calcium: 8.4 mg/dL — ABNORMAL LOW (ref 8.9–10.3)
Chloride: 94 mmol/L — ABNORMAL LOW (ref 101–111)
Creatinine, Ser: 0.36 mg/dL — ABNORMAL LOW (ref 0.44–1.00)
GFR calc non Af Amer: >60 mL/min (ref >60)
Glucose, Bld: 86 mg/dL (ref 65–99)
POTASSIUM: 3.7 mmol/L (ref 3.5–5.1)
SODIUM: 135 mmol/L (ref 135–145)
TOTAL PROTEIN: 5.4 g/dL — AB (ref 6.5–8.1)

## 2014-07-14 LAB — CBC WITH DIFFERENTIAL/PLATELET
BASOS ABS: 0 10*3/uL (ref 0–0.1)
Basophils Relative: 0 %
EOS PCT: 1 %
Eosinophils Absolute: 0.1 10*3/uL (ref 0–0.7)
HCT: 32.7 % — ABNORMAL LOW (ref 35.0–47.0)
HEMOGLOBIN: 10.9 g/dL — AB (ref 12.0–16.0)
LYMPHS ABS: 0.9 10*3/uL — AB (ref 1.0–3.6)
Lymphocytes Relative: 9 %
MCH: 28.4 pg (ref 26.0–34.0)
MCHC: 33.2 g/dL (ref 32.0–36.0)
MCV: 85.6 fL (ref 80.0–100.0)
Monocytes Absolute: 0.9 10*3/uL (ref 0.2–0.9)
Monocytes Relative: 9 %
NEUTROS ABS: 8.3 10*3/uL — AB (ref 1.4–6.5)
Neutrophils Relative %: 81 %
Platelets: 291 10*3/uL (ref 150–440)
RBC: 3.82 MIL/uL (ref 3.80–5.20)
RDW: 16.6 % — ABNORMAL HIGH (ref 11.5–14.5)
WBC: 10.2 10*3/uL (ref 3.6–11.0)

## 2014-08-05 ENCOUNTER — Encounter: Payer: Self-pay | Admitting: Family Medicine

## 2014-08-05 ENCOUNTER — Ambulatory Visit (INDEPENDENT_AMBULATORY_CARE_PROVIDER_SITE_OTHER): Payer: Commercial Managed Care - HMO | Admitting: Family Medicine

## 2014-08-05 VITALS — BP 113/74 | HR 105 | Temp 97.9°F | Resp 16 | Ht 62.0 in | Wt 124.4 lb

## 2014-08-05 DIAGNOSIS — R634 Abnormal weight loss: Secondary | ICD-10-CM | POA: Diagnosis not present

## 2014-08-05 DIAGNOSIS — S32000S Wedge compression fracture of unspecified lumbar vertebra, sequela: Secondary | ICD-10-CM | POA: Diagnosis not present

## 2014-08-05 DIAGNOSIS — L02419 Cutaneous abscess of limb, unspecified: Secondary | ICD-10-CM

## 2014-08-05 DIAGNOSIS — L03119 Cellulitis of unspecified part of limb: Secondary | ICD-10-CM | POA: Diagnosis not present

## 2014-08-05 DIAGNOSIS — E7800 Pure hypercholesterolemia, unspecified: Secondary | ICD-10-CM | POA: Insufficient documentation

## 2014-08-05 DIAGNOSIS — I10 Essential (primary) hypertension: Secondary | ICD-10-CM | POA: Insufficient documentation

## 2014-08-05 MED ORDER — TRAMADOL HCL 50 MG PO TABS: 50.0000 mg | ORAL_TABLET | Freq: Four times a day (QID) | ORAL | Status: DC

## 2014-08-05 MED ORDER — GABAPENTIN 100 MG PO CAPS: 100.0000 mg | ORAL_CAPSULE | Freq: Three times a day (TID) | ORAL | Status: DC

## 2014-08-05 NOTE — Progress Notes (Signed)
Name: Alyssa Ortega   MRN: [id number]    DOB: [birth year]   Date:08/05/2014       Progress Note  Subjective  Chief Complaint  Chief Complaint  Patient presents with  . Back Injury    follow up from rehab post fall injury to back lumbar region. Admitted 06/23/2014 discharged 07/25/2014.   . Cellulitis    L. leg/foot    HPI  For f/u of fall ad compression fractured of T11 (? New or old).  Had cellulitis L. Leg.  Has since resolved.  Was in [facility].or about 1 week and [facility] ([facility]) for about 3-4 weeks.  Discharged 10-12 days ago. No problem-specific assessment & plan notes found for this encounter.   Past Medical History  Diagnosis Date  . Hyperlipidemia   . Hypertension   . GI bleed 10/2007    secondary to duodenal ulcers  . History of epistaxis     had MRI by Dr. Carlis Abbott  . Hip fracture 3/09    left, s/p hemiarthroplasty  . Rib fractures     traumatic  . Insomnia   . Osteoporosis     History  Substance Use Topics  . Smoking status: Former Smoker    Types: Cigarettes    Quit date: 12/21/1990  . Smokeless tobacco: Never Used  . Alcohol Use: No     Current outpatient prescriptions:  .  acetaminophen (TYLENOL) 325 MG tablet, Take 2 tablets (650 mg total) by mouth every 6 (six) hours as needed for mild pain (or Fever >/= 101)., Disp: 30 tablet, Rfl: 0 . .  oxyCODONE-acetaminophen (PERCOCET/ROXICET)y mouth 3 5-325 MG per tablet, 1 tablet every 6 (six) hours as needed. , Disp: , Rfl:  .:  . .  feeding supplement, ENSURE ENLIVE, (ENSURE ENLIVE) LIQD, Take 237 mLs by mouth 2 (two) times daily between meals. (Patient not taking: Reported on 08/05/2014), Disp: 237 mL, Rfl: 12 .  gabapentin (NEURONTIN) 100 MG capsule, Take 1 capsule (100 mg total) by mouth 3 (three) times daily., Disp: 90 capsule, Rfl: 3 :    .  traMADol (ULTRAM) 50 MG tablet, Take 1 tablet (50 mg total) by mouth 4 (four) times daily., Disp: 120 tablet, Rfl: 1  Allergies  Allergen Reactions  . Aspirin Hives and Other  (See Comments)    Nosebleed per patient    Review of Systems  Constitutional: Positive for weight loss. Negative for fever.  HENT: Negative.   Eyes: Negative.   Respiratory: Negative.  Negative for cough, shortness of breath and wheezing.   Cardiovascular: Negative.  Negative for chest pain, leg swelling and PND.  Gastrointestinal: Negative.  Negative for nausea, vomiting, diarrhea and constipation.  Musculoskeletal: Positive for back pain (chronic).  Skin: Negative.  Negative for rash.  Neurological: Positive for weakness. Negative for dizziness, focal weakness, seizures and loss of consciousness.  Psychiatric/Behavioral: Negative.  Negative for depression. The patient is not nervous/anxious.       Objective  Filed Vitals:   08/05/14 0948  BP: 113/74  Pulse: 105  Temp: 97.9 F (36.6 C)  Resp: 16  Height: _0  (1.575 m)  Weight: 124 lb 6.4 oz (56.427 kg)     Physical Exam  Constitutional:  Thin and frail.  Walks with a cane.  Minimal assistance with getting o n and off exam table.  Eyes: EOM are normal. Pupils are equal, round, and reactive to light.  Neck: Normal range of motion. Neck supple. No thyromegaly present.  Cardiovascular: Regular rhythm, normal  heart sounds and intact distal pulses.   Mild tachycrdia  Pulmonary/Chest: Effort normal and breath sounds normal. No respiratory distress. She exhibits no tenderness.  Abdominal: Soft. Bowel sounds are normal. She exhibits no mass. There is no tenderness.  Musculoskeletal:  Tenderness reported to palp. Along entire thoracic and lumbar spinous processes.  Lymphadenopathy:    She has no cervical adenopathy.  Skin: Skin is warm and dry. No erythema.  Cellulitis L. Leg and foot resolved.      Recent Results (from the past 2160 hour(s))  CBC     Status: Abnormal   Collection Time: 06/23/14 11:31 AM  Result Value Ref Range   WBC 7.8 3.6-11.0 x10 3/mm 3   RBC 4.11 3.80-5.20 X10 6/mm 3   HGB 11.7 (L) 12.0-16.0  g/dL   HCT 35.3 35.0-47.0 %   MCV 86 80-100 fL   MCH 28.5 26.0-34.0 pg   MCHC 33.2 32.0-36.0 g/dL   RDW 17.3 (H) 11.5-14.5 %   Platelet 121 (L) 150-440 x10 3/mm 3  Comprehensive metabolic panel     Status: Abnormal   Collection Time: 06/23/14 11:31 AM  Result Value Ref Range   Glucose, CSF DNP mg/dL    Comment: 65-99 NOTE: New Reference Range  05/05/14    BUN 10 mg/dL    Comment: 6-20 NOTE: New Reference Range  05/05/14    Creatinine 0.59 mg/dL    Comment: 0.44-1.00 NOTE: New Reference Range  05/05/14    Sodium, Urine Random DNP mmol/L    Comment: 135-145 NOTE: New Reference Range  05/05/14    Potassium, Urine Random DNP mmol/L    Comment: 3.5-5.1 NOTE: New Reference Range  05/05/14    Chloride, Urine Random DNP mmol/L    Comment: 101-111 NOTE: New Reference Range  05/05/14    Co2 26 mmol/L    Comment: 22-32 NOTE: New Reference Range  05/05/14    Calcium, Total 8.5 (L) mg/dL    Comment: 8.9-10.3 NOTE: New Reference Range  05/05/14    SGOT(AST) 31 U/L    Comment: 15-41 NOTE: New Reference Range  05/05/14    SGPT (ALT) 17 U/L    Comment: 14-54 NOTE: New Reference Range  05/05/14    Alkaline Phosphatase 96 U/L    Comment: 38-126 NOTE: New Reference Range  05/05/14    Albumin 3.4 (L) g/dL    Comment: 3.5-5.0 NOTE: New reference range  05/05/14    Total Protein 6.6 g/dL    Comment: 6.5-8.1 NOTE: New Reference Range  05/05/14    Bilirubin,Total 0.6 mg/dL    Comment: 0.3-1.2 NOTE: New Reference Range  05/05/14    Anion Gap 7 7-16   EGFR (African American) >60    EGFR (Non-African Amer.) >60     Comment: eGFR values <57m/min/1.73 m2 may be an indication of chronic kidney disease (CKD). Calculated eGFR is useful in patients with stable renal function. The eGFR calculation will not be reliable in acutely ill patients when serum creatinine is changing rapidly. It is not useful in patients on dialysis. The eGFR calculation may not be  applicable to patients at the low and high extremes of body sizes, pregnant women, and vegetarians.    Glucose 104 (H) mg/dL    Comment: 65-99 NOTE: New Reference Range  05/05/14    Sodium 133 (L) mmol/L    Comment: 135-145 NOTE: New Reference Range  05/05/14    Potassium 3.1 (L) mmol/L    Comment: 3.5-5.1 NOTE: New Reference Range  05/05/14  Chloride 100 (L) mmol/L    Comment: 101-111 NOTE: New Reference Range  05/05/14   Troponin I     Status: None   Collection Time: 06/23/14 11:31 AM  Result Value Ref Range   Troponin-I <0.03 ng/mL    Comment: 0.00-0.03 0.03 ng/mL or less: NEGATIVE  Repeat testing in 3-6 hrs  if clinically indicated. >0.05 ng/mL: POTENTIAL  MYOCARDIAL INJURY. Repeat  testing in 3-6 hrs if  clinically indicated. NOTE: An increase or decrease  of 30% or more on serial  testing suggests a  clinically important change NOTE: New Reference Range  05/05/14   Urinalysis, Complete     Status: None   Collection Time: 06/23/14 11:31 AM  Result Value Ref Range   Color - urine Yellow    Clarity - urine Clear    Glucose,UR Negative 0-75 mg/dL   Bilirubin,UR Negative NEGATIVE   Ketone Negative NEGATIVE   Specific Gravity 1.006 1.003-1.030   Blood Negative NEGATIVE   Ph 6.0 4.5-8.0   Protein Negative NEGATIVE   Nitrite Negative NEGATIVE   Leukocyte Esterase 2+ NEGATIVE   RBC,UR 0-5 0-5 /HPF   WBC UR 6-30 0-5 /HPF   Bacteria RARE NONE SEEN   Squamous Epithelial 0-5   CBC     Status: Abnormal   Collection Time: 07/01/14 11:23 AM  Result Value Ref Range   WBC 10.1 3.6 - 11.0 K/uL   RBC 4.67 3.80 - 5.20 MIL/uL   Hemoglobin 13.2 12.0 - 16.0 g/dL   HCT 39.9 35.0 - 47.0 %   MCV 85.5 80.0 - 100.0 fL   MCH 28.3 26.0 - 34.0 pg   MCHC 33.1 32.0 - 36.0 g/dL   RDW 16.4 (H) 11.5 - 14.5 %   Platelets 230 150 - 440 K/uL  Troponin I     Status: None   Collection Time: 07/01/14 11:23 AM  Result Value Ref Range   Troponin I <0.03 <0.031 ng/mL     Comment:        NO INDICATION OF MYOCARDIAL INJURY.   APTT     Status: None   Collection Time: 07/01/14 11:23 AM  Result Value Ref Range   aPTT 35 24 - 36 seconds  Comprehensive metabolic panel     Status: Abnormal   Collection Time: 07/01/14 11:23 AM  Result Value Ref Range   Sodium 139 135 - 145 mmol/L   Potassium 3.5 3.5 - 5.1 mmol/L   Chloride 101 101 - 111 mmol/L   CO2 29 22 - 32 mmol/L   Glucose, Bld 94 65 - 99 mg/dL   BUN 8 6 - 20 mg/dL   Creatinine, Ser 0.61 0.44 - 1.00 mg/dL   Calcium 9.3 8.9 - 10.3 mg/dL   Total Protein 7.8 6.5 - 8.1 g/dL   Albumin 3.9 3.5 - 5.0 g/dL   AST 24 15 - 41 U/L   ALT 13 (L) 14 - 54 U/L   Alkaline Phosphatase 152 (H) 38 - 126 U/L   Total Bilirubin 0.8 0.3 - 1.2 mg/dL   GFR calc non Af Amer >60 >60 mL/min   GFR calc Af Amer >60 >60 mL/min    Comment: (NOTE) The eGFR has been calculated using the CKD EPI equation. This calculation has not been validated in all clinical situations. eGFR's persistently <90 mL/min signify possible Chronic Kidney Disease.    Anion gap 9 5 - 15  Protime-INR     Status: None   Collection Time: 07/01/14 11:23 AM  Result  Value Ref Range   Prothrombin Time 13.5 11.4 - 15.0 seconds   INR 1.01   Urinalysis complete, with microscopic [facility])     Status: Abnormal   Collection Time: 07/01/14 12:40 PM  Result Value Ref Range   Color, Urine STRAW (A) YELLOW   APPearance CLEAR (A) CLEAR   Glucose, UA NEGATIVE NEGATIVE mg/dL   Bilirubin Urine NEGATIVE NEGATIVE   Ketones, ur TRACE (A) NEGATIVE mg/dL   Specific Gravity, Urine 1.005 1.005 - 1.030   Hgb urine dipstick 2+ (A) NEGATIVE   pH 7.0 5.0 - 8.0   Protein, ur NEGATIVE NEGATIVE mg/dL   Nitrite NEGATIVE NEGATIVE   Leukocytes, UA 2+ (A) NEGATIVE   RBC / HPF 0-5 0 - 5 RBC/hpf   WBC, UA 6-30 0 - 5 WBC/hpf   Bacteria, UA NONE SEEN NONE SEEN   Squamous Epithelial / LPF 6-30 (A) NONE SEEN   Trans Epithel, UA 1   Blood culture (routine x 2)     Status: None    Collection Time: 07/01/14  2:11 PM  Result Value Ref Range   Specimen Description BLOOD    Special Requests BOTTLES DRAWN AEROBIC AND ANAEROBIC    Culture NO GROWTH 5 DAYS    Report Status 07/06/2014 FINAL   Blood culture (routine x 2)     Status: None   Collection Time: 07/01/14  2:11 PM  Result Value Ref Range   Specimen Description BLOOD    Special Requests BOTTLES DRAWN AEROBIC AND ANAEROBIC    Culture NO GROWTH 5 DAYS    Report Status 07/06/2014 FINAL   Troponin I     Status: None   Collection Time: 07/01/14  5:00 PM  Result Value Ref Range   Troponin I <0.03 <0.031 ng/mL    Comment:        NO INDICATION OF MYOCARDIAL INJURY.   CBC     Status: Abnormal   Collection Time: 07/01/14  5:00 PM  Result Value Ref Range   WBC 10.4 3.6 - 11.0 K/uL   RBC 4.43 3.80 - 5.20 MIL/uL   Hemoglobin 12.5 12.0 - 16.0 g/dL   HCT 38.4 35.0 - 47.0 %   MCV 86.7 80.0 - 100.0 fL   MCH 28.1 26.0 - 34.0 pg   MCHC 32.4 32.0 - 36.0 g/dL   RDW 16.7 (H) 11.5 - 14.5 %   Platelets 200 150 - 440 K/uL  Creatinine, serum     Status: None   Collection Time: 07/01/14  5:00 PM  Result Value Ref Range   Creatinine, Ser 0.56 0.44 - 1.00 mg/dL   GFR calc non Af Amer >60 >60 mL/min   GFR calc Af Amer >60 >60 mL/min    Comment: (NOTE) The eGFR has been calculated using the CKD EPI equation. This calculation has not been validated in all clinical situations. eGFR's persistently <90 mL/min signify possible Chronic Kidney Disease.   Troponin I     Status: None   Collection Time: 07/01/14  8:33 PM  Result Value Ref Range   Troponin I <0.03 <0.031 ng/mL    Comment:        NO INDICATION OF MYOCARDIAL INJURY.   Basic metabolic panel     Status: Abnormal   Collection Time: 07/02/14  4:29 AM  Result Value Ref Range   Sodium 134 (L) 135 - 145 mmol/L   Potassium 3.5 3.5 - 5.1 mmol/L   Chloride 100 (L) 101 - 111 mmol/L   CO2 28 22 -  32 mmol/L   Glucose, Bld 86 65 - 99 mg/dL   BUN 8 6 - 20 mg/dL    Creatinine, Ser 0.61 0.44 - 1.00 mg/dL   Calcium 8.6 (L) 8.9 - 10.3 mg/dL   GFR calc non Af Amer >60 >60 mL/min   GFR calc Af Amer >60 >60 mL/min    Comment: (NOTE) The eGFR has been calculated using the CKD EPI equation. This calculation has not been validated in all clinical situations. eGFR's persistently <90 mL/min signify possible Chronic Kidney Disease.    Anion gap 6 5 - 15  CBC     Status: Abnormal   Collection Time: 07/02/14  4:29 AM  Result Value Ref Range   WBC 8.5 3.6 - 11.0 K/uL   RBC 3.90 3.80 - 5.20 MIL/uL   Hemoglobin 11.5 (L) 12.0 - 16.0 g/dL   HCT 33.6 (L) 35.0 - 47.0 %   MCV 86.1 80.0 - 100.0 fL   MCH 29.4 26.0 - 34.0 pg   MCHC 34.1 32.0 - 36.0 g/dL   RDW 16.4 (H) 11.5 - 14.5 %   Platelets 193 150 - 440 K/uL  Lipid panel     Status: Abnormal   Collection Time: 07/02/14  4:29 AM  Result Value Ref Range   Cholesterol 98 0 - 200 mg/dL   Triglycerides 86 <150 mg/dL   HDL 22 (L) >40 mg/dL   Total CHOL/HDL Ratio 4.5 RATIO   VLDL 17 0 - 40 mg/dL   LDL Cholesterol 59 0 - 99 mg/dL    Comment:        Total Cholesterol/HDL:CHD Risk Coronary Heart Disease Risk Table                     Men   Women  1/2 Average Risk   3.4   3.3  Average Risk       5.0   4.4  2 X Average Risk   9.6   7.1  3 X Average Risk  23.4   11.0        Use the calculated Patient Ratio above and the CHD Risk Table to determine the patient's CHD Risk.        ATP III CLASSIFICATION (LDL):  <100     mg/dL   Optimal  100-129  mg/dL   Near or Above                    Optimal  130-159  mg/dL   Borderline  160-189  mg/dL   High  >190     mg/dL   Very High   CBC with Differential/Platelet     Status: Abnormal   Collection Time: 07/03/14  6:30 PM  Result Value Ref Range   WBC 16.0 (H) 3.6 - 11.0 K/uL   RBC 4.15 3.80 - 5.20 MIL/uL   Hemoglobin 11.8 (L) 12.0 - 16.0 g/dL   HCT 35.6 35.0 - 47.0 %   MCV 85.7 80.0 - 100.0 fL   MCH 28.5 26.0 - 34.0 pg   MCHC 33.2 32.0 - 36.0 g/dL   RDW 16.7  (H) 11.5 - 14.5 %   Platelets 194 150 - 440 K/uL   Neutrophils Relative % 89 %   Neutro Abs 14.2 (H) 1.4 - 6.5 K/uL   Lymphocytes Relative 5 %   Lymphs Abs 0.8 (L) 1.0 - 3.6 K/uL   Monocytes Relative 6 %   Monocytes Absolute 0.9 0.2 - 0.9 K/uL   Eosinophils  Relative 0 %   Eosinophils Absolute 0.0 0 - 0.7 K/uL   Basophils Relative 0 %   Basophils Absolute 0.0 0 - 0.1 K/uL  Folate     Status: None   Collection Time: 07/03/14  6:30 PM  Result Value Ref Range   Folate 7.1 >5.9 ng/mL  Magnesium     Status: None   Collection Time: 07/03/14  6:30 PM  Result Value Ref Range   Magnesium 1.7 1.7 - 2.4 mg/dL  Comprehensive metabolic panel     Status: Abnormal   Collection Time: 07/03/14  6:30 PM  Result Value Ref Range   Sodium 133 (L) 135 - 145 mmol/L   Potassium 3.1 (L) 3.5 - 5.1 mmol/L   Chloride 96 (L) 101 - 111 mmol/L   CO2 26 22 - 32 mmol/L   Glucose, Bld 101 (H) 65 - 99 mg/dL   BUN 7 6 - 20 mg/dL   Creatinine, Ser 0.56 0.44 - 1.00 mg/dL   Calcium 8.9 8.9 - 10.3 mg/dL   Total Protein 6.6 6.5 - 8.1 g/dL   Albumin 3.0 (L) 3.5 - 5.0 g/dL   AST 21 15 - 41 U/L   ALT 8 (L) 14 - 54 U/L   Alkaline Phosphatase 143 (H) 38 - 126 U/L   Total Bilirubin 0.5 0.3 - 1.2 mg/dL   GFR calc non Af Amer >60 >60 mL/min   GFR calc Af Amer >60 >60 mL/min    Comment: (NOTE) The eGFR has been calculated using the CKD EPI equation. This calculation has not been validated in all clinical situations. eGFR's persistently <60 mL/min signify possible Chronic Kidney Disease.    Anion gap 11 5 - 15  TSH     Status: None   Collection Time: 07/03/14  6:30 PM  Result Value Ref Range   TSH 1.314 0.350 - 4.500 uIU/mL  Vitamin B12     Status: None   Collection Time: 07/03/14  6:30 PM  Result Value Ref Range   Vitamin B-12 455 180 - 914 pg/mL    Comment: (NOTE) This assay is not validated for testing neonatal or myeloproliferative syndrome specimens for Vitamin B12 levels. Performed at [facility]   Vit D  25 hydroxy (rtn osteoporosis monitoring)     Status: Abnormal   Collection Time: 07/03/14  6:30 PM  Result Value Ref Range   Vit D, 25-Hydroxy 14.3 (L) 30.0 - 100.0 ng/mL    Comment: (NOTE) Vitamin D deficiency has been defined by the [place] practice guideline as a level of serum 25-OH vitamin D less than 20 ng/mL (1,2). The Endocrine Society went on to further define vitamin D insufficiency as a level between 21 and 29 ng/mL (2). 1. IOM (Institute of Medicine). 2010. Dietary reference   intakes for calcium and D. [place]: The   [employer]. 2. Holick MF, Binkley Lennon, Bischoff-Ferrari HA, et al.   Evaluation, treatment, and prevention of vitamin D   deficiency: an Endocrine Society clinical practice   guideline. JCEM. 2011 Jul; 96(7):1911-30. Performed At: [facility] [place], [place] [id number] Lindon Romp MD YI:[id number]   C difficile quick scan w PCR reflex [facility])     Status: None   Collection Time: 07/04/14  5:30 PM  Result Value Ref Range   C Diff antigen POSITIVE    C Diff toxin POSITIVE    C Diff interpretation      Positive  for toxigenic C. difficile, active toxin production present.    Comment: CRITICAL RESULT CALLED TO, READ BACK BY AND VERIFIED WITH: Yves Dill AT [id number] 07/05/14 LAB REPORT FAXED BY REQUEST   Urinalysis complete, with microscopic [facility])     Status: Abnormal   Collection Time: 07/04/14  5:30 PM  Result Value Ref Range   Color, Urine YELLOW (A) YELLOW   APPearance CLEAR (A) CLEAR   Glucose, UA NEGATIVE NEGATIVE mg/dL   Bilirubin Urine NEGATIVE NEGATIVE   Ketones, ur 1+ (A) NEGATIVE mg/dL   Specific Gravity, Urine 1.033 (H) 1.005 - 1.030   Hgb urine dipstick NEGATIVE NEGATIVE   pH 5.0 5.0 - 8.0   Protein, ur NEGATIVE NEGATIVE mg/dL   Nitrite NEGATIVE NEGATIVE   Leukocytes, UA NEGATIVE NEGATIVE   RBC / HPF 0-5 0 - 5 RBC/hpf   WBC, UA 0-5 0 -  5 WBC/hpf   Bacteria, UA NONE SEEN NONE SEEN   Squamous Epithelial / LPF 0-5 (A) NONE SEEN   Mucous PRESENT   Urine culture     Status: None   Collection Time: 07/04/14  5:30 PM  Result Value Ref Range   Specimen Description URINE, CLEAN CATCH    Special Requests URINE, CLEAN CATCH    Culture NO GROWTH 2 DAYS    Report Status 07/07/2014 FINAL   Urinalysis complete, with microscopic [facility])     Status: Abnormal   Collection Time: 07/06/14  9:30 AM  Result Value Ref Range   Color, Urine AMBER (A) YELLOW   APPearance HAZY (A) CLEAR   Glucose, UA NEGATIVE NEGATIVE mg/dL   Bilirubin Urine NEGATIVE NEGATIVE   Ketones, ur NEGATIVE NEGATIVE mg/dL   Specific Gravity, Urine 1.023 1.005 - 1.030   Hgb urine dipstick NEGATIVE NEGATIVE   pH 5.0 5.0 - 8.0   Protein, ur NEGATIVE NEGATIVE mg/dL   Nitrite NEGATIVE NEGATIVE   Leukocytes, UA NEGATIVE NEGATIVE   RBC / HPF 0-5 0 - 5 RBC/hpf   WBC, UA 0-5 0 - 5 WBC/hpf   Bacteria, UA RARE (A) NONE SEEN   Squamous Epithelial / LPF 0-5 (A) NONE SEEN   WBC Clumps PRESENT    Mucous PRESENT    Budding Yeast PRESENT    Hyaline Casts, UA PRESENT   Urine culture     Status: None   Collection Time: 07/06/14  9:30 AM  Result Value Ref Range   Specimen Description URINE, CLEAN CATCH    Special Requests NONE    Culture      40,000 COLONIES/ml CANDIDA ALBICANS 10,000 COLONIES/mL COAGULASE NEGATIVE STAPHYLOCOCCUS CALL MICROBIOLOGY LAB IF SENSITIVITIES ARE REQUIRED. FOR ORGANISM 2.    Report Status 07/10/2014 FINAL   CBC     Status: Abnormal   Collection Time: 07/06/14 12:15 PM  Result Value Ref Range   WBC 14.5 (H) 3.6 - 11.0 K/uL   RBC 3.88 3.80 - 5.20 MIL/uL   Hemoglobin 11.2 (L) 12.0 - 16.0 g/dL   HCT 33.8 (L) 35.0 - 47.0 %   MCV 87.0 80.0 - 100.0 fL   MCH 28.8 26.0 - 34.0 pg   MCHC 33.1 32.0 - 36.0 g/dL   RDW 17.1 (H) 11.5 - 14.5 %   Platelets 205 150 - 440 K/uL  Basic metabolic panel     Status: Abnormal   Collection Time: 07/06/14 12:15 PM   Result Value Ref Range   Sodium 132 (L) 135 - 145 mmol/L   Potassium 4.3 3.5 - 5.1 mmol/L   Chloride  95 (L) 101 - 111 mmol/L   CO2 26 22 - 32 mmol/L   Glucose, Bld 74 65 - 99 mg/dL   BUN 32 (H) 6 - 20 mg/dL   Creatinine, Ser 1.24 (H) 0.44 - 1.00 mg/dL   Calcium 8.2 (L) 8.9 - 10.3 mg/dL   GFR calc non Af Amer 40 (L) >60 mL/min   GFR calc Af Amer 46 (L) >60 mL/min    Comment: (NOTE) The eGFR has been calculated using the CKD EPI equation. This calculation has not been validated in all clinical situations. eGFR's persistently <60 mL/min signify possible Chronic Kidney Disease.    Anion gap 11 5 - 15  CBC with Differential/Platelet     Status: Abnormal   Collection Time: 07/10/14  9:20 AM  Result Value Ref Range   WBC 11.7 (H) 3.6 - 11.0 K/uL   RBC 4.05 3.80 - 5.20 MIL/uL   Hemoglobin 11.3 (L) 12.0 - 16.0 g/dL   HCT 34.3 (L) 35.0 - 47.0 %   MCV 84.9 80.0 - 100.0 fL   MCH 28.0 26.0 - 34.0 pg   MCHC 33.0 32.0 - 36.0 g/dL   RDW 16.3 (H) 11.5 - 14.5 %   Platelets 255 150 - 440 K/uL   Neutrophils Relative % 85 %   Neutro Abs 10.1 (H) 1.4 - 6.5 K/uL   Lymphocytes Relative 6 %   Lymphs Abs 0.7 (L) 1.0 - 3.6 K/uL   Monocytes Relative 7 %   Monocytes Absolute 0.8 0.2 - 0.9 K/uL   Eosinophils Relative 1 %   Eosinophils Absolute 0.1 0 - 0.7 K/uL   Basophils Relative 1 %   Basophils Absolute 0.1 0 - 0.1 K/uL  Comprehensive metabolic panel     Status: Abnormal   Collection Time: 07/10/14  9:20 AM  Result Value Ref Range   Sodium 129 (L) 135 - 145 mmol/L   Potassium 3.3 (L) 3.5 - 5.1 mmol/L   Chloride 95 (L) 101 - 111 mmol/L   CO2 26 22 - 32 mmol/L   Glucose, Bld 121 (H) 65 - 99 mg/dL   BUN <5 (L) 6 - 20 mg/dL   Creatinine, Ser <0.30 (L) 0.44 - 1.00 mg/dL   Calcium 8.1 (L) 8.9 - 10.3 mg/dL   Total Protein 5.6 (L) 6.5 - 8.1 g/dL   Albumin 2.4 (L) 3.5 - 5.0 g/dL   AST 18 15 - 41 U/L   ALT 9 (L) 14 - 54 U/L   Alkaline Phosphatase 113 38 - 126 U/L   Total Bilirubin 0.3 0.3 - 1.2  mg/dL   GFR calc non Af Amer NOT CALCULATED >60 mL/min   GFR calc Af Amer NOT CALCULATED >60 mL/min    Comment: (NOTE) The eGFR has been calculated using the CKD EPI equation. This calculation has not been validated in all clinical situations. eGFR's persistently <60 mL/min signify possible Chronic Kidney Disease.    Anion gap 8 5 - 15  CBC with Differential/Platelet     Status: Abnormal   Collection Time: 07/14/14  7:38 AM  Result Value Ref Range   WBC 10.2 3.6 - 11.0 K/uL   RBC 3.82 3.80 - 5.20 MIL/uL   Hemoglobin 10.9 (L) 12.0 - 16.0 g/dL   HCT 32.7 (L) 35.0 - 47.0 %   MCV 85.6 80.0 - 100.0 fL   MCH 28.4 26.0 - 34.0 pg   MCHC 33.2 32.0 - 36.0 g/dL   RDW 16.6 (H) 11.5 - 14.5 %   Platelets  291 150 - 440 K/uL   Neutrophils Relative % 81 %   Neutro Abs 8.3 (H) 1.4 - 6.5 K/uL   Lymphocytes Relative 9 %   Lymphs Abs 0.9 (L) 1.0 - 3.6 K/uL   Monocytes Relative 9 %   Monocytes Absolute 0.9 0.2 - 0.9 K/uL   Eosinophils Relative 1 %   Eosinophils Absolute 0.1 0 - 0.7 K/uL   Basophils Relative 0 %   Basophils Absolute 0.0 0 - 0.1 K/uL  Comprehensive metabolic panel     Status: Abnormal   Collection Time: 07/14/14  7:38 AM  Result Value Ref Range   Sodium 135 135 - 145 mmol/L   Potassium 3.7 3.5 - 5.1 mmol/L   Chloride 94 (L) 101 - 111 mmol/L   CO2 32 22 - 32 mmol/L   Glucose, Bld 86 65 - 99 mg/dL   BUN 6 6 - 20 mg/dL   Creatinine, Ser 0.36 (L) 0.44 - 1.00 mg/dL   Calcium 8.4 (L) 8.9 - 10.3 mg/dL   Total Protein 5.4 (L) 6.5 - 8.1 g/dL   Albumin 2.4 (L) 3.5 - 5.0 g/dL   AST 16 15 - 41 U/L   ALT 7 (L) 14 - 54 U/L   Alkaline Phosphatase 95 38 - 126 U/L   Total Bilirubin 0.3 0.3 - 1.2 mg/dL   GFR calc non Af Amer >60 >60 mL/min   GFR calc Af Amer >60 >60 mL/min    Comment: (NOTE) The eGFR has been calculated using the CKD EPI equation. This calculation has not been validated in all clinical situations. eGFR's persistently <60 mL/min signify possible Chronic Kidney Disease.     Anion gap 9 5 - 15     Assessment & Plan  1. Wedge fracture of lumbar vertebra, sequela  - gabapentin (NEURONTIN) 100 MG capsule; Take 1 capsule (100 mg total) by mouth 3 (three) times daily.  Dispense: 90 capsule; Refill: 3  2. Weight loss, non-intentional Increas calories  3. Cellulitis and abscess of leg Resolved.

## 2014-08-05 NOTE — Patient Instructions (Signed)
Must increase calories to gain eight.

## 2014-08-12 ENCOUNTER — Telehealth: Payer: Self-pay | Admitting: Family Medicine

## 2014-08-12 NOTE — Telephone Encounter (Signed)
She could take 1/2 of the pain pill that Dr. Rudene Christians had given her. She could also take Tylenol 500 mg., 1-2, 4 times a day for fever.

## 2014-08-12 NOTE — Telephone Encounter (Signed)
Patient took Advil #3 and had already had Hydrocodone. She states she felt she had fever and then sweat all night long breaking this fever. I asked if her temp was taken and she said no but felt like fever. Patient is tired and has no appetite and also has back pain very bad. Patient denies fever at this time but says she was scared to be alone while she had fever last night. Patient also states her pain pills that Dr. Rowland Lathe called in Hydrocodone makes her feel very drunk like. Advised she could have had sweating and chills from the medication or could have had fever. Feels better now and advised to make appt if this happens again. [facility]

## 2014-08-12 NOTE — Telephone Encounter (Signed)
Home health called to let us know that the pt reported a fever during the night that broke and has vague complaints of tighten in her abd. Alyssa Ortega reports that her abd seems tight but her bowls are moving. Pt currently is not experiencing a high temper

## 2014-08-13 NOTE — Telephone Encounter (Signed)
Fever had passed and they were consulting [facility] for pain med info. Patient does not like the feeling she gets while on pain meds.

## 2014-09-02 ENCOUNTER — Telehealth: Payer: Self-pay | Admitting: Family Medicine

## 2014-09-02 NOTE — Telephone Encounter (Signed)
Sherry from Home care called states that Alyssa Ortega states that she did not need care and wanted to discontinue care, and that she would be in the office to see Dr. Luan Pulling

## 2014-09-02 NOTE — Telephone Encounter (Signed)
Please give them a verbal order to discontinue care at this time. Dr. Luan Pulling can discuss the necessity of home health care with her when he returns.

## 2014-09-03 NOTE — Telephone Encounter (Signed)
Awaiting a number for [facility]

## 2014-09-03 NOTE — Telephone Encounter (Signed)
No number for [name] but [name] states this was an FYI and Discharge has been done. [facility]

## 2014-09-07 ENCOUNTER — Ambulatory Visit (INDEPENDENT_AMBULATORY_CARE_PROVIDER_SITE_OTHER): Payer: Commercial Managed Care - HMO | Admitting: Family Medicine

## 2014-09-07 ENCOUNTER — Encounter: Payer: Self-pay | Admitting: Family Medicine

## 2014-09-07 VITALS — BP 137/81 | HR 103 | Temp 98.7°F | Resp 16 | Ht 62.0 in | Wt 123.0 lb

## 2014-09-07 DIAGNOSIS — S32000S Wedge compression fracture of unspecified lumbar vertebra, sequela: Secondary | ICD-10-CM

## 2014-09-07 DIAGNOSIS — R634 Abnormal weight loss: Secondary | ICD-10-CM | POA: Diagnosis not present

## 2014-09-07 DIAGNOSIS — I10 Essential (primary) hypertension: Secondary | ICD-10-CM

## 2014-09-07 DIAGNOSIS — S32000D Wedge compression fracture of unspecified lumbar vertebra, subsequent encounter for fracture with routine healing: Secondary | ICD-10-CM

## 2014-09-07 DIAGNOSIS — M4726 Other spondylosis with radiculopathy, lumbar region: Secondary | ICD-10-CM | POA: Diagnosis not present

## 2014-09-07 MED ORDER — GABAPENTIN 100 MG PO CAPS: ORAL_CAPSULE | ORAL | Status: DC

## 2014-09-07 NOTE — Progress Notes (Signed)
Name: Alyssa Ortega   MRN: [id number]    DOB: [birth year]   Date:09/07/2014       Progress Note  Subjective  Chief Complaint  Chief Complaint  Patient presents with  . Fall    1 MO FOLLOW UP - Increased pain Left Hip and Thigh area.     HPI  Here for follow up of back pain and hip pain after fall.  Not improving.  Still losing weight.  Cannot eat well either. Denies any alcohol intake at this time (patient and caretaker)   Past Medical History  Diagnosis Date  . Hyperlipidemia   . Hypertension   . GI bleed 10/2007    secondary to duodenal ulcers  . History of epistaxis     had MRI by Dr. Carlis Abbott  . Hip fracture 3/09    left, s/p hemiarthroplasty  . Rib fractures     traumatic  . Insomnia   . Osteoporosis     History  Substance Use Topics  . Smoking status: Former Smoker    Types: Cigarettes    Quit date: 12/21/1990  . Smokeless tobacco: Never Used  . Alcohol Use: No     Current outpatient prescriptions:  .  acetaminophen (TYLENOL) 325 MG tablet, Take 2 tablets (650 mg total) by mouth every 6 (six) hours as needed for mild pain (or Fever >/= 101)., Disp: 30 tablet, Rfl: 0 .  feeding supplement, ENSURE ENLIVE, (ENSURE ENLIVE) LIQD, Take 237 mLs by mouth 2 (two) times daily between meals., Disp: 237 mL, Rfl: 12 .  gabapentin (NEURONTIN) 100 MG capsule, Take 1 capsule twice a day and 2 at bedime, Disp: 120 capsule, Rfl: 6 .  losartan (COZAAR) 25 MG tablet, , Disp: , Rfl:  .  traMADol (ULTRAM) 50 MG tablet, Take 1 tablet (50 mg total) by mouth 4 (four) times daily., Disp: 120 tablet, Rfl: 1 .  Cholecalciferol (VITAMIN D PO), Take by mouth.  , Disp: , Rfl:  .  clindamycin (CLEOCIN) 300 MG capsule, Take 300 mg by mouth 3 (three) times daily., Disp: , Rfl:  .  docusate sodium (COLACE) 100 MG capsule, Take 1 capsule (100 mg total) by mouth 2 (two) times daily. (Patient not taking: Reported on 08/05/2014), Disp: 10 capsule, Rfl: 0 .  lisinopril (PRINIVIL,ZESTRIL) 40 MG tablet,  Take 1 tablet (40 mg total) by mouth daily. (Patient not taking: Reported on 07/01/2014), Disp: 90 tablet, Rfl: 3 .  oxyCODONE-acetaminophen (PERCOCET/ROXICET) 5-325 MG per tablet, 1 tablet every 6 (six) hours as needed. , Disp: , Rfl:   Allergies  Allergen Reactions  . Aspirin Hives and Other (See Comments)    Nosebleed per patient    Review of Systems  Constitutional: Positive for weight loss and malaise/fatigue. Negative for fever and chills.  HENT: Negative for hearing loss.   Eyes: Negative for blurred vision and double vision.  Respiratory: Negative for cough, sputum production, shortness of breath and wheezing.   Cardiovascular: Negative for chest pain, palpitations, orthopnea and leg swelling.  Gastrointestinal: Negative.  Negative for heartburn, nausea, vomiting, abdominal pain and constipation.  Genitourinary: Negative.  Negative for dysuria, urgency and frequency.  Musculoskeletal: Positive for back pain. Joint pain: hip.  Skin: Negative for rash.  Neurological: Negative for dizziness, tremors and headaches.      Objective  Filed Vitals:   09/07/14 0954  BP: 137/81  Pulse: 103  Temp: 98.7 F (37.1 C)  Resp: 16  Height: 5' 2"  (1.575 m)  Weight: 123 lb (  55.792 kg)     Physical Exam  Constitutional: She is oriented to person, place, and time. She appears malnourished. She appears cachectic.  HENT:  Head: Normocephalic and atraumatic.  Eyes: Conjunctivae and EOM are normal. Pupils are equal, round, and reactive to light.  Neck: Normal range of motion. Neck supple. No thyromegaly present.  Cardiovascular: Regular rhythm and normal heart sounds.  Tachycardia present.  Exam reveals no gallop and no friction rub.   No murmur heard. Pulmonary/Chest: Breath sounds normal. No respiratory distress. She has no wheezes. She has no rales.  Abdominal: Soft. Bowel sounds are normal. She exhibits no mass. There is no tenderness.  Musculoskeletal: She exhibits no edema.        Left hip: She exhibits tenderness.       Legs: Lymphadenopathy:    She has no cervical adenopathy.  Neurological: She is alert and oriented to person, place, and time.  Neuropathic pain L. Leg from hip area.  Vitals reviewed.     Recent Results (from the past 2160 hour(s))  CBC     Status: Abnormal   Collection Time: 06/23/14 11:31 AM  Result Value Ref Range   WBC 7.8 3.6-11.0 x10 3/mm 3   RBC 4.11 3.80-5.20 X10 6/mm 3   HGB 11.7 (L) 12.0-16.0 g/dL   HCT 35.3 35.0-47.0 %   MCV 86 80-100 fL   MCH 28.5 26.0-34.0 pg   MCHC 33.2 32.0-36.0 g/dL   RDW 17.3 (H) 11.5-14.5 %   Platelet 121 (L) 150-440 x10 3/mm 3  Comprehensive metabolic panel     Status: Abnormal   Collection Time: 06/23/14 11:31 AM  Result Value Ref Range   Glucose, CSF DNP mg/dL    Comment: 65-99 NOTE: New Reference Range  05/05/14    BUN 10 mg/dL    Comment: 6-20 NOTE: New Reference Range  05/05/14    Creatinine 0.59 mg/dL    Comment: 0.44-1.00 NOTE: New Reference Range  05/05/14    Sodium, Urine Random DNP mmol/L    Comment: 135-145 NOTE: New Reference Range  05/05/14    Potassium, Urine Random DNP mmol/L    Comment: 3.5-5.1 NOTE: New Reference Range  05/05/14    Chloride, Urine Random DNP mmol/L    Comment: 101-111 NOTE: New Reference Range  05/05/14    Co2 26 mmol/L    Comment: 22-32 NOTE: New Reference Range  05/05/14    Calcium, Total 8.5 (L) mg/dL    Comment: 8.9-10.3 NOTE: New Reference Range  05/05/14    SGOT(AST) 31 U/L    Comment: 15-41 NOTE: New Reference Range  05/05/14    SGPT (ALT) 17 U/L    Comment: 14-54 NOTE: New Reference Range  05/05/14    Alkaline Phosphatase 96 U/L    Comment: 38-126 NOTE: New Reference Range  05/05/14    Albumin 3.4 (L) g/dL    Comment: 3.5-5.0 NOTE: New reference range  05/05/14    Total Protein 6.6 g/dL    Comment: 6.5-8.1 NOTE: New Reference Range  05/05/14    Bilirubin,Total 0.6 mg/dL    Comment: 0.3-1.2 NOTE: New  Reference Range  05/05/14    Anion Gap 7 7-16   EGFR (African American) >60    EGFR (Non-African Amer.) >60     Comment: eGFR values <9m/min/1.73 m2 may be an indication of chronic kidney disease (CKD). Calculated eGFR is useful in patients with stable renal function. The eGFR calculation will not be reliable in acutely ill patients when serum creatinine is changing  rapidly. It is not useful in patients on dialysis. The eGFR calculation may not be applicable to patients at the low and high extremes of body sizes, pregnant women, and vegetarians.    Glucose 104 (H) mg/dL    Comment: 65-99 NOTE: New Reference Range  05/05/14    Sodium 133 (L) mmol/L    Comment: 135-145 NOTE: New Reference Range  05/05/14    Potassium 3.1 (L) mmol/L    Comment: 3.5-5.1 NOTE: New Reference Range  05/05/14    Chloride 100 (L) mmol/L    Comment: 101-111 NOTE: New Reference Range  05/05/14   Troponin I     Status: None   Collection Time: 06/23/14 11:31 AM  Result Value Ref Range   Troponin-I <0.03 ng/mL    Comment: 0.00-0.03 0.03 ng/mL or less: NEGATIVE  Repeat testing in 3-6 hrs  if clinically indicated. >0.05 ng/mL: POTENTIAL  MYOCARDIAL INJURY. Repeat  testing in 3-6 hrs if  clinically indicated. NOTE: An increase or decrease  of 30% or more on serial  testing suggests a  clinically important change NOTE: New Reference Range  05/05/14   Urinalysis, Complete     Status: None   Collection Time: 06/23/14 11:31 AM  Result Value Ref Range   Color - urine Yellow    Clarity - urine Clear    Glucose,UR Negative 0-75 mg/dL   Bilirubin,UR Negative NEGATIVE   Ketone Negative NEGATIVE   Specific Gravity 1.006 1.003-1.030   Blood Negative NEGATIVE   Ph 6.0 4.5-8.0   Protein Negative NEGATIVE   Nitrite Negative NEGATIVE   Leukocyte Esterase 2+ NEGATIVE   RBC,UR 0-5 0-5 /HPF   WBC UR 6-30 0-5 /HPF   Bacteria RARE NONE SEEN   Squamous Epithelial 0-5   CBC     Status: Abnormal    Collection Time: 07/01/14 11:23 AM  Result Value Ref Range   WBC 10.1 3.6 - 11.0 K/uL   RBC 4.67 3.80 - 5.20 MIL/uL   Hemoglobin 13.2 12.0 - 16.0 g/dL   HCT 39.9 35.0 - 47.0 %   MCV 85.5 80.0 - 100.0 fL   MCH 28.3 26.0 - 34.0 pg   MCHC 33.1 32.0 - 36.0 g/dL   RDW 16.4 (H) 11.5 - 14.5 %   Platelets 230 150 - 440 K/uL  Troponin I     Status: None   Collection Time: 07/01/14 11:23 AM  Result Value Ref Range   Troponin I <0.03 <0.031 ng/mL    Comment:        NO INDICATION OF MYOCARDIAL INJURY.   APTT     Status: None   Collection Time: 07/01/14 11:23 AM  Result Value Ref Range   aPTT 35 24 - 36 seconds  Comprehensive metabolic panel     Status: Abnormal   Collection Time: 07/01/14 11:23 AM  Result Value Ref Range   Sodium 139 135 - 145 mmol/L   Potassium 3.5 3.5 - 5.1 mmol/L   Chloride 101 101 - 111 mmol/L   CO2 29 22 - 32 mmol/L   Glucose, Bld 94 65 - 99 mg/dL   BUN 8 6 - 20 mg/dL   Creatinine, Ser 0.61 0.44 - 1.00 mg/dL   Calcium 9.3 8.9 - 10.3 mg/dL   Total Protein 7.8 6.5 - 8.1 g/dL   Albumin 3.9 3.5 - 5.0 g/dL   AST 24 15 - 41 U/L   ALT 13 (L) 14 - 54 U/L   Alkaline Phosphatase 152 (H) 38 - 126 U/L  Total Bilirubin 0.8 0.3 - 1.2 mg/dL   GFR calc non Af Amer >60 >60 mL/min   GFR calc Af Amer >60 >60 mL/min    Comment: (NOTE) The eGFR has been calculated using the CKD EPI equation. This calculation has not been validated in all clinical situations. eGFR's persistently <90 mL/min signify possible Chronic Kidney Disease.    Anion gap 9 5 - 15  Protime-INR     Status: None   Collection Time: 07/01/14 11:23 AM  Result Value Ref Range   Prothrombin Time 13.5 11.4 - 15.0 seconds   INR 1.01   Urinalysis complete, with microscopic [facility])     Status: Abnormal   Collection Time: 07/01/14 12:40 PM  Result Value Ref Range   Color, Urine STRAW (A) YELLOW   APPearance CLEAR (A) CLEAR   Glucose, UA NEGATIVE NEGATIVE mg/dL   Bilirubin Urine NEGATIVE NEGATIVE   Ketones, ur  TRACE (A) NEGATIVE mg/dL   Specific Gravity, Urine 1.005 1.005 - 1.030   Hgb urine dipstick 2+ (A) NEGATIVE   pH 7.0 5.0 - 8.0   Protein, ur NEGATIVE NEGATIVE mg/dL   Nitrite NEGATIVE NEGATIVE   Leukocytes, UA 2+ (A) NEGATIVE   RBC / HPF 0-5 0 - 5 RBC/hpf   WBC, UA 6-30 0 - 5 WBC/hpf   Bacteria, UA NONE SEEN NONE SEEN   Squamous Epithelial / LPF 6-30 (A) NONE SEEN   Trans Epithel, UA 1   Blood culture (routine x 2)     Status: None   Collection Time: 07/01/14  2:11 PM  Result Value Ref Range   Specimen Description BLOOD    Special Requests BOTTLES DRAWN AEROBIC AND ANAEROBIC    Culture NO GROWTH 5 DAYS    Report Status 07/06/2014 FINAL   Blood culture (routine x 2)     Status: None   Collection Time: 07/01/14  2:11 PM  Result Value Ref Range   Specimen Description BLOOD    Special Requests BOTTLES DRAWN AEROBIC AND ANAEROBIC    Culture NO GROWTH 5 DAYS    Report Status 07/06/2014 FINAL   Troponin I     Status: None   Collection Time: 07/01/14  5:00 PM  Result Value Ref Range   Troponin I <0.03 <0.031 ng/mL    Comment:        NO INDICATION OF MYOCARDIAL INJURY.   CBC     Status: Abnormal   Collection Time: 07/01/14  5:00 PM  Result Value Ref Range   WBC 10.4 3.6 - 11.0 K/uL   RBC 4.43 3.80 - 5.20 MIL/uL   Hemoglobin 12.5 12.0 - 16.0 g/dL   HCT 38.4 35.0 - 47.0 %   MCV 86.7 80.0 - 100.0 fL   MCH 28.1 26.0 - 34.0 pg   MCHC 32.4 32.0 - 36.0 g/dL   RDW 16.7 (H) 11.5 - 14.5 %   Platelets 200 150 - 440 K/uL  Creatinine, serum     Status: None   Collection Time: 07/01/14  5:00 PM  Result Value Ref Range   Creatinine, Ser 0.56 0.44 - 1.00 mg/dL   GFR calc non Af Amer >60 >60 mL/min   GFR calc Af Amer >60 >60 mL/min    Comment: (NOTE) The eGFR has been calculated using the CKD EPI equation. This calculation has not been validated in all clinical situations. eGFR's persistently <90 mL/min signify possible Chronic Kidney Disease.   Troponin I     Status: None    Collection Time:  07/01/14  8:33 PM  Result Value Ref Range   Troponin I <0.03 <0.031 ng/mL    Comment:        NO INDICATION OF MYOCARDIAL INJURY.   Basic metabolic panel     Status: Abnormal   Collection Time: 07/02/14  4:29 AM  Result Value Ref Range   Sodium 134 (L) 135 - 145 mmol/L   Potassium 3.5 3.5 - 5.1 mmol/L   Chloride 100 (L) 101 - 111 mmol/L   CO2 28 22 - 32 mmol/L   Glucose, Bld 86 65 - 99 mg/dL   BUN 8 6 - 20 mg/dL   Creatinine, Ser 0.61 0.44 - 1.00 mg/dL   Calcium 8.6 (L) 8.9 - 10.3 mg/dL   GFR calc non Af Amer >60 >60 mL/min   GFR calc Af Amer >60 >60 mL/min    Comment: (NOTE) The eGFR has been calculated using the CKD EPI equation. This calculation has not been validated in all clinical situations. eGFR's persistently <90 mL/min signify possible Chronic Kidney Disease.    Anion gap 6 5 - 15  CBC     Status: Abnormal   Collection Time: 07/02/14  4:29 AM  Result Value Ref Range   WBC 8.5 3.6 - 11.0 K/uL   RBC 3.90 3.80 - 5.20 MIL/uL   Hemoglobin 11.5 (L) 12.0 - 16.0 g/dL   HCT 33.6 (L) 35.0 - 47.0 %   MCV 86.1 80.0 - 100.0 fL   MCH 29.4 26.0 - 34.0 pg   MCHC 34.1 32.0 - 36.0 g/dL   RDW 16.4 (H) 11.5 - 14.5 %   Platelets 193 150 - 440 K/uL  Lipid panel     Status: Abnormal   Collection Time: 07/02/14  4:29 AM  Result Value Ref Range   Cholesterol 98 0 - 200 mg/dL   Triglycerides 86 <150 mg/dL   HDL 22 (L) >40 mg/dL   Total CHOL/HDL Ratio 4.5 RATIO   VLDL 17 0 - 40 mg/dL   LDL Cholesterol 59 0 - 99 mg/dL    Comment:        Total Cholesterol/HDL:CHD Risk Coronary Heart Disease Risk Table                     Men   Women  1/2 Average Risk   3.4   3.3  Average Risk       5.0   4.4  2 X Average Risk   9.6   7.1  3 X Average Risk  23.4   11.0        Use the calculated Patient Ratio above and the CHD Risk Table to determine the patient's CHD Risk.        ATP III CLASSIFICATION (LDL):  <100     mg/dL   Optimal  100-129  mg/dL   Near or Above                     Optimal  130-159  mg/dL   Borderline  160-189  mg/dL   High  >190     mg/dL   Very High   CBC with Differential/Platelet     Status: Abnormal   Collection Time: 07/03/14  6:30 PM  Result Value Ref Range   WBC 16.0 (H) 3.6 - 11.0 K/uL   RBC 4.15 3.80 - 5.20 MIL/uL   Hemoglobin 11.8 (L) 12.0 - 16.0 g/dL   HCT 35.6 35.0 - 47.0 %   MCV 85.7 80.0 -  100.0 fL   MCH 28.5 26.0 - 34.0 pg   MCHC 33.2 32.0 - 36.0 g/dL   RDW 16.7 (H) 11.5 - 14.5 %   Platelets 194 150 - 440 K/uL   Neutrophils Relative % 89 %   Neutro Abs 14.2 (H) 1.4 - 6.5 K/uL   Lymphocytes Relative 5 %   Lymphs Abs 0.8 (L) 1.0 - 3.6 K/uL   Monocytes Relative 6 %   Monocytes Absolute 0.9 0.2 - 0.9 K/uL   Eosinophils Relative 0 %   Eosinophils Absolute 0.0 0 - 0.7 K/uL   Basophils Relative 0 %   Basophils Absolute 0.0 0 - 0.1 K/uL  Folate     Status: None   Collection Time: 07/03/14  6:30 PM  Result Value Ref Range   Folate 7.1 >5.9 ng/mL  Magnesium     Status: None   Collection Time: 07/03/14  6:30 PM  Result Value Ref Range   Magnesium 1.7 1.7 - 2.4 mg/dL  Comprehensive metabolic panel     Status: Abnormal   Collection Time: 07/03/14  6:30 PM  Result Value Ref Range   Sodium 133 (L) 135 - 145 mmol/L   Potassium 3.1 (L) 3.5 - 5.1 mmol/L   Chloride 96 (L) 101 - 111 mmol/L   CO2 26 22 - 32 mmol/L   Glucose, Bld 101 (H) 65 - 99 mg/dL   BUN 7 6 - 20 mg/dL   Creatinine, Ser 0.56 0.44 - 1.00 mg/dL   Calcium 8.9 8.9 - 10.3 mg/dL   Total Protein 6.6 6.5 - 8.1 g/dL   Albumin 3.0 (L) 3.5 - 5.0 g/dL   AST 21 15 - 41 U/L   ALT 8 (L) 14 - 54 U/L   Alkaline Phosphatase 143 (H) 38 - 126 U/L   Total Bilirubin 0.5 0.3 - 1.2 mg/dL   GFR calc non Af Amer >60 >60 mL/min   GFR calc Af Amer >60 >60 mL/min    Comment: (NOTE) The eGFR has been calculated using the CKD EPI equation. This calculation has not been validated in all clinical situations. eGFR's persistently <60 mL/min signify possible Chronic  Kidney Disease.    Anion gap 11 5 - 15  TSH     Status: None   Collection Time: 07/03/14  6:30 PM  Result Value Ref Range   TSH 1.314 0.350 - 4.500 uIU/mL  Vitamin B12     Status: None   Collection Time: 07/03/14  6:30 PM  Result Value Ref Range   Vitamin B-12 455 180 - 914 pg/mL    Comment: (NOTE) This assay is not validated for testing neonatal or myeloproliferative syndrome specimens for Vitamin B12 levels. Performed at [facility]   Vit D  25 hydroxy (rtn osteoporosis monitoring)     Status: Abnormal   Collection Time: 07/03/14  6:30 PM  Result Value Ref Range   Vit D, 25-Hydroxy 14.3 (L) 30.0 - 100.0 ng/mL    Comment: (NOTE) Vitamin D deficiency has been defined by the [place] practice guideline as a level of serum 25-OH vitamin D less than 20 ng/mL (1,2). The Endocrine Society went on to further define vitamin D insufficiency as a level between 21 and 29 ng/mL (2). 1. IOM (Institute of Medicine). 2010. Dietary reference   intakes for calcium and D. [place]: The   [employer]. 2. Holick MF, [place], Bischoff-Ferrari HA, et al.   Evaluation, treatment, and prevention of vitamin  D   deficiency: an Endocrine Society clinical practice   guideline. JCEM. 2011 Jul; 96(7):1911-30. Performed At: [facility] [place], [place] [id number] Lindon Romp MD [id number]   C difficile quick scan w PCR reflex [facility])     Status: None   Collection Time: 07/04/14  5:30 PM  Result Value Ref Range   C Diff antigen POSITIVE    C Diff toxin POSITIVE    C Diff interpretation      Positive for toxigenic C. difficile, active toxin production present.    Comment: CRITICAL RESULT CALLED TO, READ BACK BY AND VERIFIED WITH: Yves Dill AT [id number] 07/05/14 LAB REPORT FAXED BY REQUEST   Urinalysis complete, with microscopic [facility])     Status: Abnormal   Collection Time: 07/04/14  5:30 PM  Result  Value Ref Range   Color, Urine YELLOW (A) YELLOW   APPearance CLEAR (A) CLEAR   Glucose, UA NEGATIVE NEGATIVE mg/dL   Bilirubin Urine NEGATIVE NEGATIVE   Ketones, ur 1+ (A) NEGATIVE mg/dL   Specific Gravity, Urine 1.033 (H) 1.005 - 1.030   Hgb urine dipstick NEGATIVE NEGATIVE   pH 5.0 5.0 - 8.0   Protein, ur NEGATIVE NEGATIVE mg/dL   Nitrite NEGATIVE NEGATIVE   Leukocytes, UA NEGATIVE NEGATIVE   RBC / HPF 0-5 0 - 5 RBC/hpf   WBC, UA 0-5 0 - 5 WBC/hpf   Bacteria, UA NONE SEEN NONE SEEN   Squamous Epithelial / LPF 0-5 (A) NONE SEEN   Mucous PRESENT   Urine culture     Status: None   Collection Time: 07/04/14  5:30 PM  Result Value Ref Range   Specimen Description URINE, CLEAN CATCH    Special Requests URINE, CLEAN CATCH    Culture NO GROWTH 2 DAYS    Report Status 07/07/2014 FINAL   Urinalysis complete, with microscopic [facility])     Status: Abnormal   Collection Time: 07/06/14  9:30 AM  Result Value Ref Range   Color, Urine AMBER (A) YELLOW   APPearance HAZY (A) CLEAR   Glucose, UA NEGATIVE NEGATIVE mg/dL   Bilirubin Urine NEGATIVE NEGATIVE   Ketones, ur NEGATIVE NEGATIVE mg/dL   Specific Gravity, Urine 1.023 1.005 - 1.030   Hgb urine dipstick NEGATIVE NEGATIVE   pH 5.0 5.0 - 8.0   Protein, ur NEGATIVE NEGATIVE mg/dL   Nitrite NEGATIVE NEGATIVE   Leukocytes, UA NEGATIVE NEGATIVE   RBC / HPF 0-5 0 - 5 RBC/hpf   WBC, UA 0-5 0 - 5 WBC/hpf   Bacteria, UA RARE (A) NONE SEEN   Squamous Epithelial / LPF 0-5 (A) NONE SEEN   WBC Clumps PRESENT    Mucous PRESENT    Budding Yeast PRESENT    Hyaline Casts, UA PRESENT   Urine culture     Status: None   Collection Time: 07/06/14  9:30 AM  Result Value Ref Range   Specimen Description URINE, CLEAN CATCH    Special Requests NONE    Culture      40,000 COLONIES/ml CANDIDA ALBICANS 10,000 COLONIES/mL COAGULASE NEGATIVE STAPHYLOCOCCUS CALL MICROBIOLOGY LAB IF SENSITIVITIES ARE REQUIRED. FOR ORGANISM 2.    Report Status 07/10/2014  FINAL   CBC     Status: Abnormal   Collection Time: 07/06/14 12:15 PM  Result Value Ref Range   WBC 14.5 (H) 3.6 - 11.0 K/uL   RBC 3.88 3.80 - 5.20 MIL/uL   Hemoglobin 11.2 (L) 12.0 - 16.0 g/dL   HCT 33.8 (L) 35.0 -  47.0 %   MCV 87.0 80.0 - 100.0 fL   MCH 28.8 26.0 - 34.0 pg   MCHC 33.1 32.0 - 36.0 g/dL   RDW 17.1 (H) 11.5 - 14.5 %   Platelets 205 150 - 440 K/uL  Basic metabolic panel     Status: Abnormal   Collection Time: 07/06/14 12:15 PM  Result Value Ref Range   Sodium 132 (L) 135 - 145 mmol/L   Potassium 4.3 3.5 - 5.1 mmol/L   Chloride 95 (L) 101 - 111 mmol/L   CO2 26 22 - 32 mmol/L   Glucose, Bld 74 65 - 99 mg/dL   BUN 32 (H) 6 - 20 mg/dL   Creatinine, Ser 1.24 (H) 0.44 - 1.00 mg/dL   Calcium 8.2 (L) 8.9 - 10.3 mg/dL   GFR calc non Af Amer 40 (L) >60 mL/min   GFR calc Af Amer 46 (L) >60 mL/min    Comment: (NOTE) The eGFR has been calculated using the CKD EPI equation. This calculation has not been validated in all clinical situations. eGFR's persistently <60 mL/min signify possible Chronic Kidney Disease.    Anion gap 11 5 - 15  CBC with Differential/Platelet     Status: Abnormal   Collection Time: 07/10/14  9:20 AM  Result Value Ref Range   WBC 11.7 (H) 3.6 - 11.0 K/uL   RBC 4.05 3.80 - 5.20 MIL/uL   Hemoglobin 11.3 (L) 12.0 - 16.0 g/dL   HCT 34.3 (L) 35.0 - 47.0 %   MCV 84.9 80.0 - 100.0 fL   MCH 28.0 26.0 - 34.0 pg   MCHC 33.0 32.0 - 36.0 g/dL   RDW 16.3 (H) 11.5 - 14.5 %   Platelets 255 150 - 440 K/uL   Neutrophils Relative % 85 %   Neutro Abs 10.1 (H) 1.4 - 6.5 K/uL   Lymphocytes Relative 6 %   Lymphs Abs 0.7 (L) 1.0 - 3.6 K/uL   Monocytes Relative 7 %   Monocytes Absolute 0.8 0.2 - 0.9 K/uL   Eosinophils Relative 1 %   Eosinophils Absolute 0.1 0 - 0.7 K/uL   Basophils Relative 1 %   Basophils Absolute 0.1 0 - 0.1 K/uL  Comprehensive metabolic panel     Status: Abnormal   Collection Time: 07/10/14  9:20 AM  Result Value Ref Range   Sodium 129 (L)  135 - 145 mmol/L   Potassium 3.3 (L) 3.5 - 5.1 mmol/L   Chloride 95 (L) 101 - 111 mmol/L   CO2 26 22 - 32 mmol/L   Glucose, Bld 121 (H) 65 - 99 mg/dL   BUN <5 (L) 6 - 20 mg/dL   Creatinine, Ser <0.30 (L) 0.44 - 1.00 mg/dL   Calcium 8.1 (L) 8.9 - 10.3 mg/dL   Total Protein 5.6 (L) 6.5 - 8.1 g/dL   Albumin 2.4 (L) 3.5 - 5.0 g/dL   AST 18 15 - 41 U/L   ALT 9 (L) 14 - 54 U/L   Alkaline Phosphatase 113 38 - 126 U/L   Total Bilirubin 0.3 0.3 - 1.2 mg/dL   GFR calc non Af Amer NOT CALCULATED >60 mL/min   GFR calc Af Amer NOT CALCULATED >60 mL/min    Comment: (NOTE) The eGFR has been calculated using the CKD EPI equation. This calculation has not been validated in all clinical situations. eGFR's persistently <60 mL/min signify possible Chronic Kidney Disease.    Anion gap 8 5 - 15  CBC with Differential/Platelet  Status: Abnormal   Collection Time: 07/14/14  7:38 AM  Result Value Ref Range   WBC 10.2 3.6 - 11.0 K/uL   RBC 3.82 3.80 - 5.20 MIL/uL   Hemoglobin 10.9 (L) 12.0 - 16.0 g/dL   HCT 32.7 (L) 35.0 - 47.0 %   MCV 85.6 80.0 - 100.0 fL   MCH 28.4 26.0 - 34.0 pg   MCHC 33.2 32.0 - 36.0 g/dL   RDW 16.6 (H) 11.5 - 14.5 %   Platelets 291 150 - 440 K/uL   Neutrophils Relative % 81 %   Neutro Abs 8.3 (H) 1.4 - 6.5 K/uL   Lymphocytes Relative 9 %   Lymphs Abs 0.9 (L) 1.0 - 3.6 K/uL   Monocytes Relative 9 %   Monocytes Absolute 0.9 0.2 - 0.9 K/uL   Eosinophils Relative 1 %   Eosinophils Absolute 0.1 0 - 0.7 K/uL   Basophils Relative 0 %   Basophils Absolute 0.0 0 - 0.1 K/uL  Comprehensive metabolic panel     Status: Abnormal   Collection Time: 07/14/14  7:38 AM  Result Value Ref Range   Sodium 135 135 - 145 mmol/L   Potassium 3.7 3.5 - 5.1 mmol/L   Chloride 94 (L) 101 - 111 mmol/L   CO2 32 22 - 32 mmol/L   Glucose, Bld 86 65 - 99 mg/dL   BUN 6 6 - 20 mg/dL   Creatinine, Ser 0.36 (L) 0.44 - 1.00 mg/dL   Calcium 8.4 (L) 8.9 - 10.3 mg/dL   Total Protein 5.4 (L) 6.5 - 8.1  g/dL   Albumin 2.4 (L) 3.5 - 5.0 g/dL   AST 16 15 - 41 U/L   ALT 7 (L) 14 - 54 U/L   Alkaline Phosphatase 95 38 - 126 U/L   Total Bilirubin 0.3 0.3 - 1.2 mg/dL   GFR calc non Af Amer >60 >60 mL/min   GFR calc Af Amer >60 >60 mL/min    Comment: (NOTE) The eGFR has been calculated using the CKD EPI equation. This calculation has not been validated in all clinical situations. eGFR's persistently <60 mL/min signify possible Chronic Kidney Disease.    Anion gap 9 5 - 15     Assessment & Plan  1. Wedge fracture of lumbar vertebra, with routine healing, subsequent encounter   2. Osteoarthritis of spine with radiculopathy, lumbar region  - gabapentin (NEURONTIN) 100 MG capsule; Take 1 capsule twice a day and 2 at bedime  Dispense: 120 capsule; Refill: 6  3. Benign essential HTN  -continue Losartin at current dose.  4. Weight loss, non-intentional -discussed increasing Ensure to twice a day plus meals.  5. Wedge fracture of lumbar vertebra, sequela  - gabapentin (NEURONTIN) 100 MG capsule; Take 1 capsule twice a day and 2 at bedime  Dispense: 120 capsule; Refill: 6

## 2014-09-07 NOTE — Telephone Encounter (Signed)
Demetria Pore discuss with patient at her f/u appt. Today.-jh

## 2014-10-09 ENCOUNTER — Ambulatory Visit: Payer: Commercial Managed Care - HMO | Admitting: Family Medicine

## 2014-10-20 ENCOUNTER — Ambulatory Visit (INDEPENDENT_AMBULATORY_CARE_PROVIDER_SITE_OTHER): Payer: Commercial Managed Care - HMO | Admitting: Family Medicine

## 2014-10-20 ENCOUNTER — Encounter: Payer: Self-pay | Admitting: Family Medicine

## 2014-10-20 VITALS — BP 143/85 | HR 101 | Temp 99.2°F | Ht 58.5 in | Wt 122.1 lb

## 2014-10-20 DIAGNOSIS — M7061 Trochanteric bursitis, right hip: Secondary | ICD-10-CM

## 2014-10-20 DIAGNOSIS — I10 Essential (primary) hypertension: Secondary | ICD-10-CM

## 2014-10-20 DIAGNOSIS — R634 Abnormal weight loss: Secondary | ICD-10-CM | POA: Diagnosis not present

## 2014-10-20 MED ORDER — LIDOCAINE 5 % EX PTCH: 1.0000 | MEDICATED_PATCH | CUTANEOUS | Status: DC

## 2014-10-20 NOTE — Assessment & Plan Note (Signed)
Continue to follow with Dr. Rudene Christians for PT and injections as needed. Rx today for lidoderm patch as she lives alone, cannot do the gel, has GI issues with NSAIDs, and history of falls on neuroleptics and opiates. This is the safest medicine for her that may help her pain.

## 2014-10-20 NOTE — Assessment & Plan Note (Signed)
Will see when last labs done. Will need TSH if hasn't been drawn recently. Continue to monitor and ensure if not improving or staying stable- we will start remeron to encourage appetite.

## 2014-10-20 NOTE — Progress Notes (Signed)
BP 143/85 mmHg  Pulse 101  Temp(Src) 99.2 F (37.3 C)  Ht 4' 10.5" (1.486 m)  Wt 122 lb 1.6 oz (55.384 kg)  BMI 25.08 kg/m2  SpO2 97%  LMP  (LMP Unknown)   Subjective:    Patient ID: Alyssa Ortega, female    DOB: 1930/06/24, 81 y.o.   MRN: [id number]  HPI: Alyssa Ortega is a 81 y.o. female who presents today to establish care.   Chief Complaint  Patient presents with  . Leg Pain    left leg and hip   She was admitted to [facility] for about a month for sepsis and cellulitis of her lower extremity. She was at rehab until June at the [facility] at [place]. She had a fall in April and an injury to her lumbar region. She was diagnosed with a wedge fracture of T11 that they weren't sure if it was old or new. She was started on gabapentin in June. This was increased in July. She stopped it about 3.5 weeks after she started it because it wasn't helping. Was given the tramadol- would be afraid of falling on it. Same with the percocet. Same with the gabapentin. Can't take NSAIDs as they give her severe abdominal pain.  Saw Dr. Rudene Christians for a steroid injection of her hip- trochanteric bursitis- going to have PT.   Has been off the PT for about 3 weeks. Has been declining since then.  She had also been having issues with weight loss- taking 2 ensures daily `  Was on losartan previously. Off medicine now because she was afraid that her BP would go too low and then she would pass out.   Relevant past medical, surgical, family and social history reviewed and updated as indicated. Interim medical history since our last visit reviewed. Allergies and medications reviewed and updated.  Review of Systems  Constitutional: Negative.   Respiratory: Negative.   Cardiovascular: Negative.   Musculoskeletal: Positive for myalgias, back pain, joint swelling, arthralgias and gait problem. Negative for neck pain and neck stiffness.  Psychiatric/Behavioral: Negative.     Per HPI unless specifically indicated  above     Objective:    BP 143/85 mmHg  Pulse 101  Temp(Src) 99.2 F (37.3 C)  Ht 4' 10.5" (1.486 m)  Wt 122 lb 1.6 oz (55.384 kg)  BMI 25.08 kg/m2  SpO2 97%  LMP  (LMP Unknown)  Wt Readings from Last 3 Encounters:  10/20/14 122 lb 1.6 oz (55.384 kg)  09/07/14 123 lb (55.792 kg)  08/05/14 124 lb 6.4 oz (56.427 kg)    Physical Exam  Constitutional: She is oriented to person, place, and time. She appears well-developed. No distress.  Cachetic and kyphotic  HENT:  Head: Normocephalic and atraumatic.  Right Ear: Hearing normal.  Left Ear: Hearing normal.  Nose: Nose normal.  Eyes: Conjunctivae and lids are normal. Right eye exhibits no discharge. Left eye exhibits no discharge. No scleral icterus.  Cardiovascular: Normal rate and regular rhythm.  Exam reveals no gallop and no friction rub.   Murmur heard. Pulmonary/Chest: Effort normal and breath sounds normal. No respiratory distress. She has no wheezes. She has no rales. She exhibits no tenderness.  Abdominal: Soft. Bowel sounds are normal. She exhibits no mass. There is no tenderness. There is no rebound and no guarding.  Musculoskeletal: Normal range of motion.  Tender to palpation over L trochanteric bursa  Neurological: She is alert and oriented to person, place, and time.  Skin: Skin is intact.  No rash noted.  Psychiatric: She has a normal mood and affect. Her speech is normal and behavior is normal. Judgment and thought content normal. Cognition and memory are normal.  Nursing note and vitals reviewed.   Results for orders placed or performed during the hospital encounter of 07/14/14  CBC with Differential/Platelet  Result Value Ref Range   WBC 10.2 3.6 - 11.0 K/uL   RBC 3.82 3.80 - 5.20 MIL/uL   Hemoglobin 10.9 (L) 12.0 - 16.0 g/dL   HCT 32.7 (L) 35.0 - 47.0 %   MCV 85.6 80.0 - 100.0 fL   MCH 28.4 26.0 - 34.0 pg   MCHC 33.2 32.0 - 36.0 g/dL   RDW 16.6 (H) 11.5 - 14.5 %   Platelets 291 150 - 440 K/uL    Neutrophils Relative % 81 %   Neutro Abs 8.3 (H) 1.4 - 6.5 K/uL   Lymphocytes Relative 9 %   Lymphs Abs 0.9 (L) 1.0 - 3.6 K/uL   Monocytes Relative 9 %   Monocytes Absolute 0.9 0.2 - 0.9 K/uL   Eosinophils Relative 1 %   Eosinophils Absolute 0.1 0 - 0.7 K/uL   Basophils Relative 0 %   Basophils Absolute 0.0 0 - 0.1 K/uL  Comprehensive metabolic panel  Result Value Ref Range   Sodium 135 135 - 145 mmol/L   Potassium 3.7 3.5 - 5.1 mmol/L   Chloride 94 (L) 101 - 111 mmol/L   CO2 32 22 - 32 mmol/L   Glucose, Bld 86 65 - 99 mg/dL   BUN 6 6 - 20 mg/dL   Creatinine, Ser 0.36 (L) 0.44 - 1.00 mg/dL   Calcium 8.4 (L) 8.9 - 10.3 mg/dL   Total Protein 5.4 (L) 6.5 - 8.1 g/dL   Albumin 2.4 (L) 3.5 - 5.0 g/dL   AST 16 15 - 41 U/L   ALT 7 (L) 14 - 54 U/L   Alkaline Phosphatase 95 38 - 126 U/L   Total Bilirubin 0.3 0.3 - 1.2 mg/dL   GFR calc non Af Amer >60 >60 mL/min   GFR calc Af Amer >60 >60 mL/min   Anion gap 9 5 - 15      Assessment & Plan:   Problem List Items Addressed This Visit      Cardiovascular and Mediastinum   Benign essential HTN    Will keep off medicine now due to age and fear of falls. Continue to monitor closely.         Musculoskeletal and Integument   Trochanteric bursitis of right hip - Primary    Continue to follow with Dr. Rudene Christians for PT and injections as needed. Rx today for lidoderm patch as she lives alone, cannot do the gel, has GI issues with NSAIDs, and history of falls on neuroleptics and opiates. This is the safest medicine for her that may help her pain.         Other   Weight loss, non-intentional    Will see when last labs done. Will need TSH if hasn't been drawn recently. Continue to monitor and ensure if not improving or staying stable- we will start remeron to encourage appetite.          Follow up plan: Return in about 4 weeks (around 11/17/2014) for records release.

## 2014-10-20 NOTE — Assessment & Plan Note (Signed)
Will keep off medicine now due to age and fear of falls. Continue to monitor closely.

## 2014-10-23 ENCOUNTER — Ambulatory Visit: Payer: Commercial Managed Care - HMO | Admitting: Family Medicine

## 2014-11-06 ENCOUNTER — Emergency Department
Admission: EM | Admit: 2014-11-06 | Discharge: 2014-11-06 | Disposition: A | Discharge status: Home / Self Care | Payer: Commercial Managed Care - HMO | Attending: Emergency Medicine | Admitting: Emergency Medicine

## 2014-11-06 ENCOUNTER — Encounter: Payer: Self-pay | Admitting: Emergency Medicine

## 2014-11-06 DIAGNOSIS — Z79899 Other long term (current) drug therapy: Secondary | ICD-10-CM | POA: Insufficient documentation

## 2014-11-06 DIAGNOSIS — Z87891 Personal history of nicotine dependence: Secondary | ICD-10-CM | POA: Insufficient documentation

## 2014-11-06 DIAGNOSIS — K2970 Gastritis, unspecified, without bleeding: Secondary | ICD-10-CM | POA: Diagnosis not present

## 2014-11-06 DIAGNOSIS — E860 Dehydration: Secondary | ICD-10-CM | POA: Insufficient documentation

## 2014-11-06 DIAGNOSIS — R000 Tachycardia, unspecified: Secondary | ICD-10-CM | POA: Insufficient documentation

## 2014-11-06 DIAGNOSIS — R079 Chest pain, unspecified: Secondary | ICD-10-CM | POA: Diagnosis present

## 2014-11-06 DIAGNOSIS — I10 Essential (primary) hypertension: Secondary | ICD-10-CM | POA: Insufficient documentation

## 2014-11-06 LAB — COMPREHENSIVE METABOLIC PANEL
ALT: 17 U/L (ref 14–54)
AST: 35 U/L (ref 15–41)
Albumin: 4.2 g/dL (ref 3.5–5.0)
Alkaline Phosphatase: 103 U/L (ref 38–126)
Anion gap: 10 (ref 5–15)
BILIRUBIN TOTAL: 1.1 mg/dL (ref 0.3–1.2)
BUN: 5 mg/dL — AB (ref 6–20)
CO2: 27 mmol/L (ref 22–32)
CREATININE: 0.46 mg/dL (ref 0.44–1.00)
Calcium: 9.1 mg/dL (ref 8.9–10.3)
Chloride: 95 mmol/L — ABNORMAL LOW (ref 101–111)
GFR calc Af Amer: >60 mL/min (ref >60)
Glucose, Bld: 107 mg/dL — ABNORMAL HIGH (ref 65–99)
Potassium: 4 mmol/L (ref 3.5–5.1)
Sodium: 132 mmol/L — ABNORMAL LOW (ref 135–145)
TOTAL PROTEIN: 7.2 g/dL (ref 6.5–8.1)

## 2014-11-06 LAB — LIPASE, BLOOD: Lipase: 62 U/L — ABNORMAL HIGH (ref 22–51)

## 2014-11-06 LAB — CBC WITH DIFFERENTIAL/PLATELET
Basophils Absolute: 0 10*3/uL (ref 0–0.1)
Basophils Relative: 1 %
Eosinophils Absolute: 0 10*3/uL (ref 0–0.7)
Eosinophils Relative: 0 %
HCT: 40.6 % (ref 35.0–47.0)
Hemoglobin: 13.8 g/dL (ref 12.0–16.0)
Lymphocytes Relative: 14 %
Lymphs Abs: 0.8 10*3/uL — ABNORMAL LOW (ref 1.0–3.6)
MCH: 29.7 pg (ref 26.0–34.0)
MCHC: 33.9 g/dL (ref 32.0–36.0)
MCV: 87.7 fL (ref 80.0–100.0)
Monocytes Absolute: 0.5 10*3/uL (ref 0.2–0.9)
Monocytes Relative: 8 %
Neutro Abs: 4.7 10*3/uL (ref 1.4–6.5)
Neutrophils Relative %: 77 %
Platelets: 135 10*3/uL — ABNORMAL LOW (ref 150–440)
RBC: 4.63 MIL/uL (ref 3.80–5.20)
RDW: 16.9 % — ABNORMAL HIGH (ref 11.5–14.5)
WBC: 6.1 10*3/uL (ref 3.6–11.0)

## 2014-11-06 LAB — TROPONIN I: Troponin I: <0.03 ng/mL

## 2014-11-06 MED ORDER — SODIUM CHLORIDE 0.9 % IV BOLUS (SEPSIS)
1000.0000 mL | Freq: Once | INTRAVENOUS | Status: AC
Start: 2014-11-06 — End: 2014-11-06
  Administered 2014-11-06: 1000 mL via INTRAVENOUS

## 2014-11-06 MED ORDER — GI COCKTAIL ~~LOC~~
30.0000 mL | ORAL | Status: AC
  Administered 2014-11-06: 30 mL via ORAL
  Filled 2014-11-06: qty 30

## 2014-11-06 MED ORDER — FAMOTIDINE 20 MG PO TABS
40.0000 mg | ORAL_TABLET | Freq: Once | ORAL | Status: AC
  Administered 2014-11-06: 40 mg via ORAL
  Filled 2014-11-06: qty 2

## 2014-11-06 NOTE — ED Notes (Signed)
Pt states pain on left side of chest that started over the last week and has gotten worse over last 2 days.

## 2014-11-06 NOTE — Discharge Instructions (Signed)
Dehydration Dehydration is when you lose more fluids from the body than you take in. Vital organs such as the kidneys, brain, and heart cannot function without a proper amount of fluids and salt. Any loss of fluids from the body can cause dehydration.  Older adults are at a higher risk of dehydration than younger adults. As we age, our bodies are less able to conserve water and do not respond to temperature changes as well. Also, older adults do not become thirsty as easily or quickly. Because of this, older adults often do not realize they need to increase fluids to avoid dehydration.  CAUSES   Vomiting.  Diarrhea.  Excessive sweating.  Excessive urination.  Fever.  Certain medicines, such as blood pressure medicines called diuretics.  Poorly controlled blood sugars. SIGNS AND SYMPTOMS  Mild dehydration:  Thirst.  Dry lips.  Slightly dry mouth. Moderate dehydration:  Very dry mouth.  Sunken eyes.  Skin does not bounce back quickly when lightly pinched and released.  Dark urine and decreased urine production.  Decreased tear production.  Headache. Severe dehydration:  Very dry mouth.  Extreme thirst.  Rapid, weak pulse (more than 100 beats per minute at rest).  Cold hands and feet.  Not able to sweat in spite of heat.  Rapid breathing.  Blue lips.  Confusion and lethargy.  Difficulty being awakened.  Minimal urine production.  No tears. DIAGNOSIS  Your health care provider will diagnose dehydration based on your symptoms and your exam. Blood and urine tests will help confirm the diagnosis. The diagnostic evaluation should also identify the cause of dehydration. TREATMENT  Treatment of mild or moderate dehydration can often be done at home by increasing the amount of fluids that you drink. It is best to drink small amounts of fluid more often. Drinking too much at one time can make vomiting worse. Severe dehydration needs to be treated at the hospital.  You may be given IV fluids that contain water and electrolytes. HOME CARE INSTRUCTIONS   Ask your health care provider about specific rehydration instructions.  Drink enough fluids to keep your urine clear or pale yellow.  Drink small amounts frequently if you have nausea and vomiting.  Eat as you normally do.  Avoid:  Foods or drinks high in sugar.  Carbonated drinks.  Juice.  Extremely hot or cold fluids.  Drinks with caffeine.  Fatty, greasy foods.  Alcohol.  Tobacco.  Overeating.  Gelatin desserts.  Wash your hands well to avoid spreading bacteria and viruses.  Only take over-the-counter or prescription medicines for pain, discomfort, or fever as directed by your health care provider.  Ask your health care provider if you should continue all prescribed and over-the-counter medicines.  Keep all follow-up appointments with your health care provider. SEEK MEDICAL CARE IF:  You have abdominal pain, and it increases or stays in one area (localizes).  You have a rash, stiff neck, or severe headache.  You are irritable, sleepy, or difficult to awaken.  You are weak, dizzy, or extremely thirsty.  You have a fever. SEEK IMMEDIATE MEDICAL CARE IF:   You are unable to keep fluids down, or you get worse despite treatment.  You have frequent episodes of vomiting or diarrhea.  You have blood or green matter (bile) in your vomit.  You have blood in your stool, or your stool looks black and tarry.  You have not urinated in 6-8 hours, or you have only urinated a small amount of very dark urine.  You faint. MAKE SURE YOU:   Understand these instructions.  Will watch your condition.  Will get help right away if you are not doing well or get worse. Document Released: 05/06/2003 Document Revised: 02/18/2013 Document Reviewed: 10/21/2012 [facility] Patient Information 2015 [place], [place]. This information is not intended to replace advice given to you by your  health care provider. Make sure you discuss any questions you have with your health care provider.  Gastritis, Adult Gastritis is soreness and swelling (inflammation) of the lining of the stomach. Gastritis can develop as a sudden onset (acute) or long-term (chronic) condition. If gastritis is not treated, it can lead to stomach bleeding and ulcers. CAUSES  Gastritis occurs when the stomach lining is weak or damaged. Digestive juices from the stomach then inflame the weakened stomach lining. The stomach lining may be weak or damaged due to viral or bacterial infections. One common bacterial infection is the Helicobacter pylori infection. Gastritis can also result from excessive alcohol consumption, taking certain medicines, or having too much acid in the stomach.  SYMPTOMS  In some cases, there are no symptoms. When symptoms are present, they may include:  Pain or a burning sensation in the upper abdomen.  Nausea.  Vomiting.  An uncomfortable feeling of fullness after eating. DIAGNOSIS  Your caregiver may suspect you have gastritis based on your symptoms and a physical exam. To determine the cause of your gastritis, your caregiver may perform the following:  Blood or stool tests to check for the H pylori bacterium.  Gastroscopy. A thin, flexible tube (endoscope) is passed down the esophagus and into the stomach. The endoscope has a light and camera on the end. Your caregiver uses the endoscope to view the inside of the stomach.  Taking a tissue sample (biopsy) from the stomach to examine under a microscope. TREATMENT  Depending on the cause of your gastritis, medicines may be prescribed. If you have a bacterial infection, such as an H pylori infection, antibiotics may be given. If your gastritis is caused by too much acid in the stomach, H2 blockers or antacids may be given. Your caregiver may recommend that you stop taking aspirin, ibuprofen, or other nonsteroidal anti-inflammatory drugs  (NSAIDs). HOME CARE INSTRUCTIONS  Only take over-the-counter or prescription medicines as directed by your caregiver.  If you were given antibiotic medicines, take them as directed. Finish them even if you start to feel better.  Drink enough fluids to keep your urine clear or pale yellow.  Avoid foods and drinks that make your symptoms worse, such as:  Caffeine or alcoholic drinks.  Chocolate.  Peppermint or mint flavorings.  Garlic and onions.  Spicy foods.  Citrus fruits, such as oranges, lemons, or limes.  Tomato-based foods such as sauce, chili, salsa, and pizza.  Fried and fatty foods.  Eat small, frequent meals instead of large meals. SEEK IMMEDIATE MEDICAL CARE IF:   You have black or dark red stools.  You vomit blood or material that looks like coffee grounds.  You are unable to keep fluids down.  Your abdominal pain gets worse.  You have a fever.  You do not feel better after 1 week.  You have any other questions or concerns. MAKE SURE YOU:  Understand these instructions.  Will watch your condition.  Will get help right away if you are not doing well or get worse. Document Released: 02/07/2001 Document Revised: 08/15/2011 Document Reviewed: 03/29/2011 [facility] Patient Information 2015 [place], [place]. This information is not intended to replace advice  given to you by your health care provider. Make sure you discuss any questions you have with your health care provider.

## 2014-11-06 NOTE — ED Provider Notes (Signed)
[facility] Emergency Department Provider Note  ____________________________________________  Time seen: 9:15 AM on arrival by EMS  I have reviewed the triage vital signs and the nursing notes.   HISTORY  Chief Complaint Chest Pain    HPI Alyssa Ortega is a 81 y.o. female with a history of alcohol abuse who complains of chest pain for the past 2 nights. The patient reports that the pain is in the middle of her chest and achy, nonradiating, no diaphoresis vomiting or shortness of breath. It is worse with movement and with changes of position.  EMS reports that the patient has long history of alcoholism and will often drink 5-8 beers in a day. She recently has been binge drinking again and her children took her beers supply away. As per her usual pattern, when her kids took her beer away, she started complaining of chest pain.  No vomiting diarrhea abdominal pain back pain dizziness or syncope. No fever. No hallucinations or seizures.   Past Medical History  Diagnosis Date  . Hyperlipidemia   . Hypertension   . GI bleed 10/2007    secondary to duodenal ulcers  . History of epistaxis     had MRI by Dr. Carlis Abbott  . Hip fracture 3/09    left, s/p hemiarthroplasty  . Rib fractures     traumatic  . Insomnia   . Osteoporosis   . Bursitis      Patient Active Problem List   Diagnosis Date Noted  . Trochanteric bursitis of right hip 10/20/2014  . Benign essential HTN 08/05/2014  . Hypercholesterolemia without hypertriglyceridemia 08/05/2014  . Cellulitis and abscess of leg 07/01/2014  . Wedge fracture of lumbar vertebra 09/30/2013  . Degenerative arthritis of lumbar spine 07/25/2013  . Screening for breast cancer 12/22/2010  . Alcohol abuse, episodic 12/22/2010  . Weight loss, non-intentional 12/22/2010  . Tinnitus of left ear 12/22/2010  . Thrombocytopenia 12/22/2010  . Osteoporosis with fracture   . Rib fractures   . History of epistaxis   . GI  bleed 10/29/2007     Past Surgical History  Procedure Laterality Date  . Hemiarthroplasty hip  3/09, 10/11    left hip  . Joint replacement  2009    Left hip  . Abdominal hysterectomy    . Back surgery       Current Outpatient Rx  Name  Route  Sig  Dispense  Refill  . acetaminophen (TYLENOL) 650 MG CR tablet   Oral   Take 650-1,300 mg by mouth daily as needed for pain.         . feeding supplement, ENSURE ENLIVE, (ENSURE ENLIVE) LIQD   Oral   Take 237 mLs by mouth 2 (two) times daily between meals. Patient not taking: Reported on 11/06/2014   237 mL   12   . lidocaine (LIDODERM) 5 %   Transdermal   Place 1 patch onto the skin daily. Remove & Discard patch within 12 hours or as directed by MD Patient not taking: Reported on 11/06/2014   30 patch   2      Allergies Aspirin   Family History  Problem Relation Age of Onset  . Cancer Son 40    colon  . Arthritis Brother     Social History Social History  Substance Use Topics  . Smoking status: Former Smoker    Types: Cigarettes    Quit date: 12/21/1990  . Smokeless tobacco: Never Used  . Alcohol Use: Yes  Comment: occasional    Review of Systems  Constitutional:   No fever or chills. No weight changes Eyes:   No blurry vision or double vision.  ENT:   No sore throat. Cardiovascular:   Positive chest pain as above Respiratory:   No dyspnea or cough. Gastrointestinal:   Negative for abdominal pain, vomiting and diarrhea.  No BRBPR or melena. Genitourinary:   Negative for dysuria, urinary retention, bloody urine, or difficulty urinating. Musculoskeletal:   Negative for back pain. No joint swelling or pain. Skin:   Negative for rash. Neurological:   Negative for headaches, focal weakness or numbness. Psychiatric:  No anxiety or depression.   Endocrine:  No hot/cold intolerance, changes in energy, or sleep difficulty.  10-point ROS otherwise  negative.  ____________________________________________   PHYSICAL EXAM:  VITAL SIGNS: ED Triage Vitals  Enc Vitals Group     BP 11/06/14 0929 152/86 mmHg     Pulse Rate 11/06/14 0929 104     Resp 11/06/14 0929 24     Temp 11/06/14 0929 98.2 F (36.8 C)     Temp Source 11/06/14 0929 Oral     SpO2 11/06/14 0929 96 %     Weight 11/06/14 0929 122 lb (55.339 kg)     Height 11/06/14 0929 5\' 2"  (1.575 m)     Head Cir --      Peak Flow --      Pain Score 11/06/14 0931 10     Pain Loc --      Pain Edu? --      Excl. in [place]? --      Constitutional:   Alert and oriented. Well appearing and in no distress. Eyes:   No scleral icterus. No conjunctival pallor. PERRL. EOMI ENT   Head:   Normocephalic and atraumatic.   Nose:   No congestion/rhinnorhea. No septal hematoma   Mouth/Throat:   Dry mucous membranes, no pharyngeal erythema. No peritonsillar mass. No uvula shift.   Neck:   No stridor. No SubQ emphysema. No meningismus. Hematological/Lymphatic/Immunilogical:   No cervical lymphadenopathy. Cardiovascular:   Tachycardia heart rate 105. Normal and symmetric distal pulses are present in all extremities. No murmurs, rubs, or gallops. Respiratory:   Normal respiratory effort without tachypnea nor retractions. Breath sounds are clear and equal bilaterally. No wheezes/rales/rhonchi. Chest wall nontender Gastrointestinal:   Soft and nontender. No distention. There is no CVA tenderness.  No rebound, rigidity, or guarding. Genitourinary:   deferred Musculoskeletal:   Nontender with normal range of motion in all extremities. No joint effusions.  No lower extremity tenderness.  No edema. Neurologic:   Normal speech and language.  CN 2-10 normal. Motor grossly intact. No pronator drift.  Normal gait with cane. No tremor No gross focal neurologic deficits are appreciated.  Skin:    Skin is warm, dry and intact. No rash noted.  No petechiae, purpura, or bullae. Poor skin  turgor Psychiatric:   Mood and affect are normal. Speech and behavior are normal. Patient exhibits appropriate insight and judgment.  ____________________________________________    LABS (pertinent positives/negatives) (all labs ordered are listed, but only abnormal results are displayed) Labs Reviewed  CBC WITH DIFFERENTIAL/PLATELET - Abnormal; Notable for the following:    RDW 16.9 (*)    Platelets 135 (*)    Lymphs Abs 0.8 (*)    All other components within normal limits  COMPREHENSIVE METABOLIC PANEL - Abnormal; Notable for the following:    Sodium 132 (*)    Chloride 95 (*)  Glucose, Bld 107 (*)    BUN 5 (*)    All other components within normal limits  LIPASE, BLOOD - Abnormal; Notable for the following:    Lipase 62 (*)    All other components within normal limits  TROPONIN I   ____________________________________________   EKG  Interpreted by me Sinus tachycardia rate 107, left axis, normal intervals. Poor R-wave progression in anterior precordial leads. Normal ST segments and T waves.  ____________________________________________    RADIOLOGY    ____________________________________________   PROCEDURES   ____________________________________________   INITIAL IMPRESSION / ASSESSMENT AND PLAN / ED COURSE  Pertinent labs & imaging results that were available during my care of the patient were reviewed by me and considered in my medical decision making (see chart for details).  Patient presents with atypical chest pain which is likely due to gastritis in the setting of recent alcohol binge drinking. Very low suspicion for ACS PE TAD pneumothorax carditis mediastinitis pneumonia or sepsis. Check labs and give IV fluids for mild dehydration and plan for discharge home if the workup is not significant.  ----------------------------------------- 10:29 AM on 11/06/2014 -----------------------------------------  Vitals remained stable. Patient looks well,  no distress, good spirits. Feels very well. Unremarkable workup discussed with the patient, she is happy to go home and follow-up with her primary care doctor. I counseled her that her alcohol use is likely causing episodes of gastritis and mild subclinical pancreatitis and that she should avoid further alcohol use in the future. She also notes that she does not eat much and that she will try to increase her food intake. He is very well-appearing, low suspicion for biliary pathology AAA perforation obstruction. We'll discharge home   ____________________________________________   FINAL CLINICAL IMPRESSION(S) / ED DIAGNOSES  Final diagnoses:  Gastritis  Mild dehydration      Carrie Mew, MD 11/06/14 1030

## 2014-11-09 ENCOUNTER — Ambulatory Visit: Payer: Commercial Managed Care - HMO | Admitting: Family Medicine

## 2014-11-17 ENCOUNTER — Ambulatory Visit (INDEPENDENT_AMBULATORY_CARE_PROVIDER_SITE_OTHER): Payer: Commercial Managed Care - HMO | Admitting: Family Medicine

## 2014-11-17 ENCOUNTER — Encounter: Payer: Self-pay | Admitting: Family Medicine

## 2014-11-17 VITALS — BP 134/78 | HR 93 | Temp 98.9°F | Wt 121.0 lb

## 2014-11-17 DIAGNOSIS — I10 Essential (primary) hypertension: Secondary | ICD-10-CM

## 2014-11-17 DIAGNOSIS — R634 Abnormal weight loss: Secondary | ICD-10-CM

## 2014-11-17 DIAGNOSIS — D229 Melanocytic nevi, unspecified: Secondary | ICD-10-CM | POA: Diagnosis not present

## 2014-11-17 DIAGNOSIS — Z23 Encounter for immunization: Secondary | ICD-10-CM | POA: Diagnosis not present

## 2014-11-17 MED ORDER — LIDOCAINE HCL 4 % EX SOLN: CUTANEOUS | Status: DC | PRN

## 2014-11-17 NOTE — Assessment & Plan Note (Signed)
Much better on recheck. More concerned about hypotension at this time. Will keep her off medicine and continue to monitor.

## 2014-11-17 NOTE — Progress Notes (Signed)
BP 134/78 mmHg  Pulse 93  Temp(Src) 98.9 F (37.2 C)  Wt 121 lb (54.885 kg)  SpO2 97%  LMP  (LMP Unknown)   Subjective:    Patient ID: Alyssa Ortega, female    DOB: 11-28-1930, 81 y.o.   MRN: [id number]  HPI: Alyssa Ortega is a 81 y.o. female  Chief Complaint  Patient presents with  . Hospitalization Follow-up   ER FOLLOW UP Time since discharge: 11 days Hospital/facility: [facility] Diagnosis: Gastritis and dehydration, ETOH abuse Procedures/tests: labs, CXR, EKG, IV fluids Consultants: none New medications: none  Discharge instructions: follow up here   Status: better   Feeling better. No more chest pain. No more belly pain. Does note that she has some trouble getting around. Has PT coming in, but would like home health to come in and check on her. Her neighbors and her son come in on a regular basis to check on her, but she would like some extra help. She didn't get her lidocaine patches approved, so she has still had pain in her hip. Would like to try the lidocaine gel to see if that helps. Brought in today by a friend. No longer drives. Has difficulty getting around without pain in her hip.   Relevant past medical, surgical, family and social history reviewed and updated as indicated. Interim medical history since our last visit reviewed. Allergies and medications reviewed and updated.  Review of Systems  Constitutional: Negative.   Respiratory: Negative.   Cardiovascular: Negative.   Gastrointestinal: Negative.   Musculoskeletal: Negative.   Psychiatric/Behavioral: Negative.     Per HPI unless specifically indicated above     Objective:    BP 134/78 mmHg  Pulse 93  Temp(Src) 98.9 F (37.2 C)  Wt 121 lb (54.885 kg)  SpO2 97%  LMP  (LMP Unknown)  Wt Readings from Last 3 Encounters:  11/17/14 121 lb (54.885 kg)  11/06/14 122 lb (55.339 kg)  10/20/14 122 lb 1.6 oz (55.384 kg)    Physical Exam  Constitutional: She is oriented to person, place, and time. She  appears well-developed and well-nourished. No distress.  HENT:  Head: Normocephalic and atraumatic.  Right Ear: Hearing normal.  Left Ear: Hearing normal.  Nose: Nose normal.  Eyes: Conjunctivae and lids are normal. Right eye exhibits no discharge. Left eye exhibits no discharge. No scleral icterus.  Cardiovascular: Normal rate, regular rhythm and intact distal pulses.  Exam reveals no gallop and no friction rub.   Murmur heard. Pulmonary/Chest: Effort normal and breath sounds normal. No respiratory distress. She has no wheezes. She has no rales. She exhibits no tenderness.  Abdominal: Soft. Bowel sounds are normal. She exhibits no distension and no mass. There is no tenderness. There is no rebound and no guarding.  Musculoskeletal: Normal range of motion. She exhibits tenderness.  L trochanter and hip  Neurological: She is alert and oriented to person, place, and time.  Skin: Skin is warm, dry and intact. No rash noted. No erythema. No pallor.  Numerous moles and AKs and SKs on her face, arms and chest  Psychiatric: She has a normal mood and affect. Her speech is normal and behavior is normal. Judgment and thought content normal. Cognition and memory are normal.  Nursing note and vitals reviewed.   Results for orders placed or performed during the hospital encounter of 11/06/14  Troponin I  Result Value Ref Range   Troponin I <0.03 <0.031 ng/mL  CBC with Differential  Result Value Ref Range  WBC 6.1 3.6 - 11.0 K/uL   RBC 4.63 3.80 - 5.20 MIL/uL   Hemoglobin 13.8 12.0 - 16.0 g/dL   HCT 40.6 35.0 - 47.0 %   MCV 87.7 80.0 - 100.0 fL   MCH 29.7 26.0 - 34.0 pg   MCHC 33.9 32.0 - 36.0 g/dL   RDW 16.9 (H) 11.5 - 14.5 %   Platelets 135 (L) 150 - 440 K/uL   Neutrophils Relative % 77 %   Neutro Abs 4.7 1.4 - 6.5 K/uL   Lymphocytes Relative 14 %   Lymphs Abs 0.8 (L) 1.0 - 3.6 K/uL   Monocytes Relative 8 %   Monocytes Absolute 0.5 0.2 - 0.9 K/uL   Eosinophils Relative 0 %    Eosinophils Absolute 0.0 0 - 0.7 K/uL   Basophils Relative 1 %   Basophils Absolute 0.0 0 - 0.1 K/uL  Comprehensive metabolic panel  Result Value Ref Range   Sodium 132 (L) 135 - 145 mmol/L   Potassium 4.0 3.5 - 5.1 mmol/L   Chloride 95 (L) 101 - 111 mmol/L   CO2 27 22 - 32 mmol/L   Glucose, Bld 107 (H) 65 - 99 mg/dL   BUN 5 (L) 6 - 20 mg/dL   Creatinine, Ser 0.46 0.44 - 1.00 mg/dL   Calcium 9.1 8.9 - 10.3 mg/dL   Total Protein 7.2 6.5 - 8.1 g/dL   Albumin 4.2 3.5 - 5.0 g/dL   AST 35 15 - 41 U/L   ALT 17 14 - 54 U/L   Alkaline Phosphatase 103 38 - 126 U/L   Total Bilirubin 1.1 0.3 - 1.2 mg/dL   GFR calc non Af Amer >60 >60 mL/min   GFR calc Af Amer >60 >60 mL/min   Anion gap 10 5 - 15  Lipase, blood  Result Value Ref Range   Lipase 62 (H) 22 - 51 U/L      Assessment & Plan:   Problem List Items Addressed This Visit      Cardiovascular and Mediastinum   Benign essential HTN - Primary    Much better on recheck. More concerned about hypotension at this time. Will keep her off medicine and continue to monitor.         Other   Weight loss, non-intentional    Checking TSH today. Continue ensure. Continue to monitor.       Relevant Orders   TSH    Other Visit Diagnoses    Immunization due        Flu shot given today.     Relevant Orders    Flu Vaccine QUAD 36+ mos PF IM (Fluarix & Fluzone Quad PF) (Completed)    Numerous moles        Would like the removed. Referral to derm made today.     Relevant Orders    Ambulatory referral to Dermatology        Follow up plan: Return in about 3 weeks (around 12/08/2014).

## 2014-11-17 NOTE — Assessment & Plan Note (Signed)
Checking TSH today. Continue ensure. Continue to monitor.

## 2014-11-18 ENCOUNTER — Encounter: Payer: Self-pay | Admitting: Family Medicine

## 2014-11-18 LAB — TSH: TSH: 2.49 u[IU]/mL (ref 0.450–4.500)

## 2014-11-19 ENCOUNTER — Telehealth: Payer: Self-pay | Admitting: Family Medicine

## 2014-11-19 NOTE — Telephone Encounter (Signed)
Pt's friend called requesting that Lidocaine be changed from topical solution to Lidocaine ointment 5%. Stated the solution is to runny. Pharm is [facility] in [place]. Thanks.

## 2014-11-20 MED ORDER — LIDOCAINE 5 % EX OINT: 1.0000 "application " | TOPICAL_OINTMENT | CUTANEOUS | Status: DC | PRN

## 2014-11-20 NOTE — Telephone Encounter (Signed)
Patient notified

## 2014-11-20 NOTE — Telephone Encounter (Signed)
Rx sent to her pharmacy. Unclear if they will cover it.

## 2014-11-20 NOTE — Telephone Encounter (Signed)
Forward to provider

## 2014-12-08 ENCOUNTER — Ambulatory Visit (INDEPENDENT_AMBULATORY_CARE_PROVIDER_SITE_OTHER): Payer: Commercial Managed Care - HMO | Admitting: Family Medicine

## 2014-12-08 ENCOUNTER — Encounter: Payer: Self-pay | Admitting: Family Medicine

## 2014-12-08 VITALS — BP 128/74 | HR 102 | Temp 97.7°F | Wt 120.0 lb

## 2014-12-08 DIAGNOSIS — I10 Essential (primary) hypertension: Secondary | ICD-10-CM | POA: Diagnosis not present

## 2014-12-08 DIAGNOSIS — J069 Acute upper respiratory infection, unspecified: Secondary | ICD-10-CM | POA: Diagnosis not present

## 2014-12-08 DIAGNOSIS — M7061 Trochanteric bursitis, right hip: Secondary | ICD-10-CM

## 2014-12-08 MED ORDER — PREDNISONE 50 MG PO TABS
ORAL_TABLET | ORAL | Status: DC
Start: 2014-12-08 — End: 2014-12-17

## 2014-12-08 NOTE — Assessment & Plan Note (Signed)
Encouraged patient to follow up with Dr. Rudene Christians for evaluation. They will call for an appointment.

## 2014-12-08 NOTE — Patient Instructions (Signed)

## 2014-12-08 NOTE — Assessment & Plan Note (Signed)
Better on recheck. More concern about hypotension and risk of falling. Continue to monitor. Continue off medication.

## 2014-12-08 NOTE — Progress Notes (Signed)
BP 128/74 mmHg  Pulse 102  Temp(Src) 97.7 F (36.5 C)  Wt 120 lb (54.432 kg)  SpO2 96%  LMP  (LMP Unknown)   Subjective:    Patient ID: Alyssa Ortega, female    DOB: 06-09-1930, 81 y.o.   MRN: [id number]  HPI: Alyssa Ortega is a 81 y.o. female  Chief Complaint  Patient presents with  . Hypertension  . Hip Pain    left  . URI    nasal congestion, sneezing and nasal drainage   UPPER RESPIRATORY TRACT INFECTION Worst symptom: congestion Fever: yes- subjective on Saturday Cough: yes Shortness of breath: yes with activity Wheezing: no Chest pain: no Chest tightness: no Chest congestion: no Nasal congestion: yes Runny nose: yes Post nasal drip: yes Sneezing: yes Sore throat: no Swollen glands: no Sinus pressure: no Headache: no Face pain: no Toothache: no Ear pain: yes, both ears  Ear pressure: no  Eyes red/itching:no Eye drainage/crusting: no  Vomiting: no Rash: no Fatigue: yes Sick contacts: no Strep contacts: no  Context: stable Recurrent sinusitis: no Relief with OTC cold/cough medications: no  Treatments attempted: Nothing   Has been using the lidocaine cream and finds that it has been helpful, provides some temporary relief. Shot in her hip helped for about a week and half. Would like to go back and see Dr. Rudene Christians to consider having another injection.   HYPERTENSION- still feeling dizzy occasionally, doing better since coming off the blood pressure medicine. Home health is coming and that is going well.  Hypertension status: controlled  Satisfied with current treatment? yes Duration of hypertension: chronic BP monitoring frequency:  a few times a week BP medication side effects:  yes Aspirin: no Recurrent headaches: no Visual changes: no Palpitations: no Dyspnea: no Chest pain: no Lower extremity edema: yes Dizzy/lightheaded: yes  Relevant past medical, surgical, family and social history reviewed and updated as indicated. Interim medical  history since our last visit reviewed. Allergies and medications reviewed and updated.  Review of Systems  Constitutional: Negative.   Respiratory: Negative.   Cardiovascular: Negative.   Gastrointestinal: Negative.   Musculoskeletal: Positive for myalgias and arthralgias. Negative for back pain, joint swelling, gait problem, neck pain and neck stiffness.  Skin: Negative.   Psychiatric/Behavioral: Negative.     Per HPI unless specifically indicated above     Objective:    BP 128/74 mmHg  Pulse 102  Temp(Src) 97.7 F (36.5 C)  Wt 120 lb (54.432 kg)  SpO2 96%  LMP  (LMP Unknown)  Wt Readings from Last 3 Encounters:  12/08/14 120 lb (54.432 kg)  11/17/14 121 lb (54.885 kg)  11/06/14 122 lb (55.339 kg)    Physical Exam  Constitutional: She is oriented to person, place, and time. She appears well-developed and well-nourished. No distress.  HENT:  Head: Normocephalic and atraumatic.  Right Ear: Hearing and external ear normal.  Left Ear: Hearing and external ear normal.  Nose: Nose normal.  Mouth/Throat: Oropharynx is clear and moist. No oropharyngeal exudate.  Eyes: Conjunctivae, EOM and lids are normal. Pupils are equal, round, and reactive to light. Right eye exhibits no discharge. Left eye exhibits no discharge. No scleral icterus.  Neck: Normal range of motion. Neck supple. No JVD present. No tracheal deviation present. No thyromegaly present.  Cardiovascular: Normal rate, regular rhythm, normal heart sounds and intact distal pulses.  Exam reveals no gallop and no friction rub.   No murmur heard. Pulmonary/Chest: Effort normal and breath sounds normal. No stridor.  No respiratory distress. She has no wheezes. She has no rales. She exhibits no tenderness.  Musculoskeletal: Normal range of motion.  Lymphadenopathy:    She has no cervical adenopathy.  Neurological: She is alert and oriented to person, place, and time.  Skin: Skin is warm, dry and intact. No rash noted. No  erythema. No pallor.  Psychiatric: She has a normal mood and affect. Her speech is normal and behavior is normal. Judgment and thought content normal. Cognition and memory are normal.  Nursing note and vitals reviewed.   Results for orders placed or performed in visit on 11/17/14  TSH  Result Value Ref Range   TSH 2.490 0.450 - 4.500 uIU/mL      Assessment & Plan:   Problem List Items Addressed This Visit      Cardiovascular and Mediastinum   Benign essential HTN - Primary    Better on recheck. More concern about hypotension and risk of falling. Continue to monitor. Continue off medication.         Musculoskeletal and Integument   Trochanteric bursitis of right hip    Encouraged patient to follow up with Dr. Rudene Christians for evaluation. They will call for an appointment.        Other Visit Diagnoses    Upper respiratory infection        Will treat with prednisone for 4 days to try to decrease swelling. Call if not getting better or getting worse. No sign of bacterial infection at this time.         Follow up plan: Return in about 3 months (around 03/10/2015) for Follow up.

## 2014-12-17 ENCOUNTER — Ambulatory Visit (INDEPENDENT_AMBULATORY_CARE_PROVIDER_SITE_OTHER): Payer: Commercial Managed Care - HMO | Admitting: Family Medicine

## 2014-12-17 ENCOUNTER — Encounter: Payer: Self-pay | Admitting: Family Medicine

## 2014-12-17 VITALS — BP 150/89 | HR 102 | Temp 98.4°F | Ht <= 58 in | Wt 119.0 lb

## 2014-12-17 DIAGNOSIS — R0602 Shortness of breath: Secondary | ICD-10-CM

## 2014-12-17 DIAGNOSIS — J209 Acute bronchitis, unspecified: Secondary | ICD-10-CM

## 2014-12-17 MED ORDER — PREDNISONE 50 MG PO TABS: ORAL_TABLET | ORAL | Status: DC

## 2014-12-17 MED ORDER — ALBUTEROL SULFATE (2.5 MG/3ML) 0.083% IN NEBU: 2.5000 mg | INHALATION_SOLUTION | Freq: Once | RESPIRATORY_TRACT | Status: DC

## 2014-12-17 MED ORDER — DOXYCYCLINE HYCLATE 100 MG PO TABS: 100.0000 mg | ORAL_TABLET | Freq: Two times a day (BID) | ORAL | Status: DC

## 2014-12-17 NOTE — Patient Instructions (Signed)

## 2014-12-17 NOTE — Progress Notes (Signed)
BP 150/89 mmHg  Pulse 102  Temp(Src) 98.4 F (36.9 C)  Ht 4' 9.6" (1.463 m)  Wt 119 lb (53.978 kg)  BMI 25.22 kg/m2  SpO2 96%  LMP  (LMP Unknown)   Subjective:    Patient ID: Alyssa Ortega, female    DOB: [birth year], 81 y.o.   MRN: [id number]  HPI: Alyssa Ortega is a 81 y.o. female  Chief Complaint  Patient presents with  . Shortness of Breath    Patient states that she had a pain in her upper abdomen yesterday   SHORTNESS OF BREATH- notes that she has been panting and having trouble breathing Duration: about 2 weeks Onset: gradual Description of breathing discomfort: panting Severity: moderate Episode duration: every day, a couple of hours Frequency:every day Related to exertion: yes Cough: yes and yes non-productive Chest tightness: yes Wheezing: no Fevers: yes, subjective Chest pain: no Palpitations: yes  Nausea: yes Diaphoresis: yes Deconditioning: yes Status: stable Aggravating factors: moving around, laying flat Alleviating factors: nothing Treatments attempted:   Relevant past medical, surgical, family and social history reviewed and updated as indicated. Interim medical history since our last visit reviewed. Allergies and medications reviewed and updated.  Review of Systems  Constitutional: Negative.   HENT: Negative.   Respiratory: Positive for cough, chest tightness, shortness of breath and wheezing. Negative for apnea, choking and stridor.   Cardiovascular: Negative.   Psychiatric/Behavioral: Negative.     Per HPI unless specifically indicated above     Objective:    BP 150/89 mmHg  Pulse 102  Temp(Src) 98.4 F (36.9 C)  Ht 4' 9.6" (1.463 m)  Wt 119 lb (53.978 kg)  BMI 25.22 kg/m2  SpO2 96%  LMP  (LMP Unknown)  Wt Readings from Last 3 Encounters:  12/17/14 119 lb (53.978 kg)  12/08/14 120 lb (54.432 kg)  11/17/14 121 lb (54.885 kg)    Physical Exam  Constitutional: She is oriented to person, place, and time. She appears  well-developed and well-nourished. No distress.  HENT:  Head: Normocephalic and atraumatic.  Right Ear: Hearing normal.  Left Ear: Hearing normal.  Nose: Nose normal.  Eyes: Conjunctivae and lids are normal. Right eye exhibits no discharge. Left eye exhibits no discharge. No scleral icterus.  Cardiovascular: Normal rate, regular rhythm, normal heart sounds and intact distal pulses.  Exam reveals no gallop and no friction rub.   No murmur heard. Pulmonary/Chest: Effort normal. No respiratory distress. She has decreased breath sounds in the right upper field, the right middle field, the right lower field, the left upper field, the left middle field and the left lower field. She has wheezes in the left upper field and the left middle field. She has rhonchi in the left upper field and the left middle field. She has no rales. She exhibits tenderness.  Fine rhonchi L lung  Musculoskeletal: Normal range of motion.  Neurological: She is alert and oriented to person, place, and time.  Skin: Skin is warm, dry and intact. No rash noted. No erythema. No pallor.  Psychiatric: She has a normal mood and affect. Her speech is normal and behavior is normal. Judgment and thought content normal. Cognition and memory are normal.  Nursing note and vitals reviewed.   Results for orders placed or performed in visit on 11/17/14  TSH  Result Value Ref Range   TSH 2.490 0.450 - 4.500 uIU/mL      Assessment & Plan:   Problem List Items Addressed This Visit  None    Visit Diagnoses    Acute bronchitis, unspecified organism    -  Primary    Will treat with prednisone and doxycycline to cover for CAP. Lungs sound better and feels better following neb. Follow up  in 2 weeks for lung recheck.     SOB (shortness of breath)        Got worse after URI last visit.     Relevant Medications    albuterol (PROVENTIL) (2.5 MG/3ML) 0.083% nebulizer solution 2.5 mg    Other Relevant Orders    EKG 12-Lead (Completed)         Follow up plan: Return in about 2 weeks (around 12/31/2014) for Lung recheck.

## 2014-12-24 ENCOUNTER — Encounter: Payer: Self-pay | Admitting: Family Medicine

## 2014-12-24 ENCOUNTER — Ambulatory Visit (INDEPENDENT_AMBULATORY_CARE_PROVIDER_SITE_OTHER): Payer: Commercial Managed Care - HMO | Admitting: Family Medicine

## 2014-12-24 VITALS — BP 166/85 | HR 98 | Temp 98.4°F | Ht 59.2 in | Wt 124.8 lb

## 2014-12-24 DIAGNOSIS — J209 Acute bronchitis, unspecified: Secondary | ICD-10-CM | POA: Diagnosis not present

## 2014-12-24 NOTE — Progress Notes (Signed)
BP 166/85 mmHg  Pulse 98  Temp(Src) 98.4 F (36.9 C)  Ht 4' 11.2" (1.504 m)  Wt 124 lb 12.8 oz (56.609 kg)  BMI 25.03 kg/m2  SpO2 94%  LMP  (LMP Unknown)   Subjective:    Patient ID: Alyssa Ortega, female    DOB: 1930/07/07, 81 y.o.   MRN: [id number]  HPI: Alyssa Ortega is a 81 y.o. female  Chief Complaint  Patient presents with  . URI    pt is here for lung recheck- states she felt tired, weak and drousy yesterday and last night   Here today for lung recheck. Felt really bad yesterday. Finished with the prednisone.  Breathing has been doing better. Less coughing. Still taking brakes when she walks.  Doing better with PT. Feels really stuffy in her nose. Otherwise feeling well with no other concerns or complaints at this time.   Relevant past medical, surgical, family and social history reviewed and updated as indicated. Interim medical history since our last visit reviewed. Allergies and medications reviewed and updated.  Review of Systems  Constitutional: Negative.   HENT: Positive for congestion. Negative for dental problem, drooling, ear discharge, ear pain, facial swelling, hearing loss, mouth sores, nosebleeds, postnasal drip, rhinorrhea, sinus pressure, sneezing, sore throat, tinnitus, trouble swallowing and voice change.   Respiratory: Negative.   Cardiovascular: Negative.   Psychiatric/Behavioral: Negative.     Per HPI unless specifically indicated above     Objective:    BP 166/85 mmHg  Pulse 98  Temp(Src) 98.4 F (36.9 C)  Ht 4' 11.2" (1.504 m)  Wt 124 lb 12.8 oz (56.609 kg)  BMI 25.03 kg/m2  SpO2 94%  LMP  (LMP Unknown)  Wt Readings from Last 3 Encounters:  12/24/14 124 lb 12.8 oz (56.609 kg)  12/17/14 119 lb (53.978 kg)  12/08/14 120 lb (54.432 kg)    Physical Exam  Constitutional: She is oriented to person, place, and time. She appears well-developed and well-nourished. No distress.  HENT:  Head: Normocephalic and atraumatic.  Right Ear:  Hearing normal.  Left Ear: Hearing normal.  Nose: Nose normal.  Mouth/Throat: Oropharynx is clear and moist.  Slightly swollen and pale, + rhinorrhea  Eyes: Conjunctivae, EOM and lids are normal. Pupils are equal, round, and reactive to light. Right eye exhibits no discharge. Left eye exhibits no discharge. No scleral icterus.  Neck: Normal range of motion. Neck supple. No JVD present. No tracheal deviation present. No thyromegaly present.  Cardiovascular: Normal rate, regular rhythm and intact distal pulses.  Exam reveals no gallop and no friction rub.   Murmur heard. Pulmonary/Chest: Effort normal and breath sounds normal. No stridor. No respiratory distress. She has no wheezes. She has no rales. She exhibits no tenderness.  Musculoskeletal: Normal range of motion.  Lymphadenopathy:    She has no cervical adenopathy.  Neurological: She is alert and oriented to person, place, and time.  Skin: Skin is warm, dry and intact. No rash noted. No erythema. No pallor.  Psychiatric: She has a normal mood and affect. Her speech is normal and behavior is normal. Judgment and thought content normal. Cognition and memory are normal.  Nursing note and vitals reviewed.   Results for orders placed or performed in visit on 11/17/14  TSH  Result Value Ref Range   TSH 2.490 0.450 - 4.500 uIU/mL      Assessment & Plan:   Problem List Items Addressed This Visit    None    Visit Diagnoses  Acute bronchitis, unspecified organism    -  Primary    Resolved. Lungs clear. Will do steam and vicks and will do nasal saline if this doesn't help. Call if not getting better.         Follow up plan: Return in about 3 months (around 03/26/2015).

## 2014-12-31 ENCOUNTER — Ambulatory Visit: Payer: Commercial Managed Care - HMO | Admitting: Family Medicine

## 2015-01-01 ENCOUNTER — Telehealth: Payer: Self-pay

## 2015-01-01 DIAGNOSIS — M160 Bilateral primary osteoarthritis of hip: Secondary | ICD-10-CM

## 2015-01-01 NOTE — Telephone Encounter (Signed)
REferral generated.  

## 2015-01-01 NOTE — Telephone Encounter (Signed)
Patient called she would like a referral to Dr.Menz at [facility] to discuss injections in her back and hips.

## 2015-01-06 ENCOUNTER — Emergency Department: Payer: Commercial Managed Care - HMO

## 2015-01-06 ENCOUNTER — Encounter: Payer: Self-pay | Admitting: Emergency Medicine

## 2015-01-06 ENCOUNTER — Emergency Department
Admission: EM | Admit: 2015-01-06 | Discharge: 2015-01-06 | Disposition: A | Discharge status: Home / Self Care | Payer: Commercial Managed Care - HMO | Attending: Emergency Medicine | Admitting: Emergency Medicine

## 2015-01-06 DIAGNOSIS — I10 Essential (primary) hypertension: Secondary | ICD-10-CM | POA: Diagnosis not present

## 2015-01-06 DIAGNOSIS — Z79899 Other long term (current) drug therapy: Secondary | ICD-10-CM | POA: Insufficient documentation

## 2015-01-06 DIAGNOSIS — S3992XA Unspecified injury of lower back, initial encounter: Secondary | ICD-10-CM | POA: Diagnosis present

## 2015-01-06 DIAGNOSIS — Z792 Long term (current) use of antibiotics: Secondary | ICD-10-CM | POA: Insufficient documentation

## 2015-01-06 DIAGNOSIS — S79911A Unspecified injury of right hip, initial encounter: Secondary | ICD-10-CM | POA: Diagnosis not present

## 2015-01-06 DIAGNOSIS — Z87891 Personal history of nicotine dependence: Secondary | ICD-10-CM | POA: Diagnosis not present

## 2015-01-06 DIAGNOSIS — Y9389 Activity, other specified: Secondary | ICD-10-CM | POA: Insufficient documentation

## 2015-01-06 DIAGNOSIS — W19XXXA Unspecified fall, initial encounter: Secondary | ICD-10-CM

## 2015-01-06 DIAGNOSIS — T148 Other injury of unspecified body region: Secondary | ICD-10-CM | POA: Insufficient documentation

## 2015-01-06 DIAGNOSIS — W1839XA Other fall on same level, initial encounter: Secondary | ICD-10-CM | POA: Insufficient documentation

## 2015-01-06 DIAGNOSIS — Y998 Other external cause status: Secondary | ICD-10-CM | POA: Insufficient documentation

## 2015-01-06 DIAGNOSIS — Y92009 Unspecified place in unspecified non-institutional (private) residence as the place of occurrence of the external cause: Secondary | ICD-10-CM | POA: Insufficient documentation

## 2015-01-06 DIAGNOSIS — T148XXA Other injury of unspecified body region, initial encounter: Secondary | ICD-10-CM

## 2015-01-06 LAB — URINALYSIS COMPLETE WITH MICROSCOPIC (ARMC ONLY)
BILIRUBIN URINE: NEGATIVE
GLUCOSE, UA: NEGATIVE mg/dL
HGB URINE DIPSTICK: NEGATIVE
KETONES UR: NEGATIVE mg/dL
Nitrite: NEGATIVE
Protein, ur: NEGATIVE mg/dL
RBC / HPF: NONE SEEN RBC/hpf (ref 0–5)
SPECIFIC GRAVITY, URINE: 1.002 — AB (ref 1.005–1.030)
pH: 6 (ref 5.0–8.0)

## 2015-01-06 LAB — CBC WITH DIFFERENTIAL/PLATELET
BASOS ABS: 0 10*3/uL (ref 0–0.1)
Basophils Relative: 1 %
EOS PCT: 2 %
Eosinophils Absolute: 0.1 10*3/uL (ref 0–0.7)
HCT: 42 % (ref 35.0–47.0)
Hemoglobin: 14.2 g/dL (ref 12.0–16.0)
LYMPHS PCT: 34 %
Lymphs Abs: 1.9 10*3/uL (ref 1.0–3.6)
MCH: 31.8 pg (ref 26.0–34.0)
MCHC: 33.9 g/dL (ref 32.0–36.0)
MCV: 93.9 fL (ref 80.0–100.0)
MONO ABS: 0.4 10*3/uL (ref 0.2–0.9)
Monocytes Relative: 8 %
Neutro Abs: 3.1 10*3/uL (ref 1.4–6.5)
Neutrophils Relative %: 55 %
PLATELETS: 140 10*3/uL — AB (ref 150–440)
RBC: 4.47 MIL/uL (ref 3.80–5.20)
RDW: 15.4 % — AB (ref 11.5–14.5)
WBC: 5.6 10*3/uL (ref 3.6–11.0)

## 2015-01-06 LAB — BASIC METABOLIC PANEL
ANION GAP: 9 (ref 5–15)
BUN: 6 mg/dL (ref 6–20)
CALCIUM: 9 mg/dL (ref 8.9–10.3)
CO2: 30 mmol/L (ref 22–32)
Chloride: 92 mmol/L — ABNORMAL LOW (ref 101–111)
Creatinine, Ser: 0.42 mg/dL — ABNORMAL LOW (ref 0.44–1.00)
GFR calc Af Amer: >60 mL/min (ref >60)
GLUCOSE: 101 mg/dL — AB (ref 65–99)
Potassium: 4 mmol/L (ref 3.5–5.1)
Sodium: 131 mmol/L — ABNORMAL LOW (ref 135–145)

## 2015-01-06 NOTE — ED Provider Notes (Signed)
[facility] Emergency Department Provider Note    ____________________________________________  Time seen: On EMS arrival  I have reviewed the triage vital signs and the nursing notes.   HISTORY  Chief Complaint Fall   History limited by: Intoxication   HPI Alyssa Ortega is a 81 y.o. female who presents to the emergency department because of concern for buttock pain and difficulty walking after a fall. The patient states that she was drinking alcohol today. States she had 2 beers. No loss of consciousness. Per EMS the patient was able to ambulate with assistance. Is complaining of pain primarily in the lower back. Worse on the right side.   Past Medical History  Diagnosis Date  . Hyperlipidemia   . Hypertension   . GI bleed 10/2007    secondary to duodenal ulcers  . History of epistaxis     had MRI by Dr. Carlis Abbott  . Hip fracture ([place]) 3/09    left, s/p hemiarthroplasty  . Rib fractures     traumatic  . Insomnia   . Osteoporosis   . Bursitis   . Dehydration   . Gastritis     Patient Active Problem List   Diagnosis Date Noted  . Trochanteric bursitis of right hip 10/20/2014  . Benign essential HTN 08/05/2014  . Hypercholesterolemia without hypertriglyceridemia 08/05/2014  . Cellulitis and abscess of leg 07/01/2014  . Wedge fracture of lumbar vertebra ([place]) 09/30/2013  . Degenerative arthritis of lumbar spine 07/25/2013  . Screening for breast cancer 12/22/2010  . Alcohol abuse, episodic 12/22/2010  . Weight loss, non-intentional 12/22/2010  . Tinnitus of left ear 12/22/2010  . Thrombocytopenia ([place]) 12/22/2010  . Osteoporosis with fracture   . Rib fractures   . History of epistaxis   . GI bleed 10/29/2007    Past Surgical History  Procedure Laterality Date  . Hemiarthroplasty hip  3/09, 10/11    left hip  . Joint replacement  2009    Left hip  . Abdominal hysterectomy    . Back surgery      Current Outpatient Rx  Name  Route   Sig  Dispense  Refill  . acetaminophen (TYLENOL) 650 MG CR tablet   Oral   Take 650-1,300 mg by mouth daily as needed for pain.         Marland Kitchen doxycycline (VIBRA-TABS) 100 MG tablet   Oral   Take 1 tablet (100 mg total) by mouth 2 (two) times daily.   20 tablet   0   . feeding supplement, ENSURE ENLIVE, (ENSURE ENLIVE) LIQD   Oral   Take 237 mLs by mouth 2 (two) times daily between meals.   237 mL   12   . lidocaine (XYLOCAINE) 5 % ointment   Topical   Apply 1 application topically as needed.   35.44 g   0     Allergies Aspirin  Family History  Problem Relation Age of Onset  . Cancer Son 40    colon  . Arthritis Brother     Social History Social History  Substance Use Topics  . Smoking status: Former Smoker    Types: Cigarettes    Quit date: 12/21/1990  . Smokeless tobacco: Never Used  . Alcohol Use: Yes     Comment: occasional    Review of Systems  Constitutional: Negative for fever. Cardiovascular: Negative for chest pain. Respiratory: Negative for shortness of breath. Gastrointestinal: Negative for abdominal pain, vomiting and diarrhea. Genitourinary: Negative for dysuria. Musculoskeletal: Positive for  right hip pain Skin: Negative for rash. Neurological: Negative for headaches, focal weakness or numbness.  10-point ROS otherwise negative.  ____________________________________________   PHYSICAL EXAM:  VITAL SIGNS: ED Triage Vitals  Enc Vitals Group     BP 01/06/15 1714 118/66 mmHg     Pulse Rate 01/06/15 1714 85     Resp 01/06/15 1714 14     Temp 01/06/15 1714 98 F (36.7 C)     Temp Source 01/06/15 1714 Oral     SpO2 01/06/15 1714 95 %     Weight 01/06/15 1714 130 lb (58.968 kg)     Height 01/06/15 1714 5\' 3"  (1.6 m)     Head Cir --      Peak Flow --      Pain Score 01/06/15 1719 3   Constitutional: Alert and oriented. Well appearing and in no distress. Eyes: Conjunctivae are normal. PERRL. Normal extraocular movements. ENT    Head: Normocephalic and atraumatic.   Nose: No congestion/rhinnorhea.   Mouth/Throat: Mucous membranes are moist.   Neck: No stridor. Hematological/Lymphatic/Immunilogical: No cervical lymphadenopathy. Cardiovascular: Normal rate, regular rhythm.  No murmurs, rubs, or gallops. Respiratory: Normal respiratory effort without tachypnea nor retractions. Breath sounds are clear and equal bilaterally. No wheezes/rales/rhonchi. Gastrointestinal: Soft and nontender. No distention.  Genitourinary: Deferred Musculoskeletal: Normal range of motion in all extremities. Some tenderness to palpation of the right hip. Neurologic:  Normal speech and language. No gross focal neurologic deficits are appreciated.  Skin:  Skin is warm, dry and intact. No rash noted. Psychiatric: Mood and affect are normal. Speech and behavior are normal. Patient exhibits appropriate insight and judgment.  ____________________________________________    LABS (pertinent positives/negatives)  Labs Reviewed  CBC WITH DIFFERENTIAL/PLATELET - Abnormal; Notable for the following:    RDW 15.4 (*)    Platelets 140 (*)    All other components within normal limits  BASIC METABOLIC PANEL - Abnormal; Notable for the following:    Sodium 131 (*)    Chloride 92 (*)    Glucose, Bld 101 (*)    Creatinine, Ser 0.42 (*)    All other components within normal limits  URINALYSIS COMPLETEWITH MICROSCOPIC ([facility] ONLY) - Abnormal; Notable for the following:    Color, Urine STRAW (*)    APPearance CLEAR (*)    Specific Gravity, Urine 1.002 (*)    Leukocytes, UA TRACE (*)    Bacteria, UA RARE (*)    Squamous Epithelial / LPF 0-5 (*)    All other components within normal limits     ____________________________________________   EKG  I, Nance Pear, attending physician, personally viewed and interpreted this EKG  EKG Time: 1717 Rate: 82 Rhythm: normal sinus rhythm Axis: left axis deviation Intervals: qtc 464 QRS:  narrow, q waves V3 ST changes: no st elevation Impression: abnormal ekg   ____________________________________________    RADIOLOGY  Right hip IMPRESSION: Postsurgical change without acute abnormality.   ____________________________________________   PROCEDURES  Procedure(s) performed: None  Critical Care performed: No  ____________________________________________   INITIAL IMPRESSION / ASSESSMENT AND PLAN / ED COURSE  Pertinent labs & imaging results that were available during my care of the patient were reviewed by me and considered in my medical decision making (see chart for details).  Patient presents to the emergency department today with concerns for low back and right hip pain after a fall. On exam patient appears well. Slightly skewed. Blood work without concerning findings. He did get an x-ray of the right hip  without any concerning findings.  ____________________________________________   FINAL CLINICAL IMPRESSION(S) / ED DIAGNOSES  Final diagnoses:  Fall, initial encounter  Contusion     Nance Pear, MD 01/06/15 2031

## 2015-01-06 NOTE — Discharge Instructions (Signed)
Please seek medical attention for any high fevers, chest pain, shortness of breath, change in behavior, persistent vomiting, bloody stool or any other new or concerning symptoms. ° °Fall Prevention in the Home  °Falls can cause injuries and can affect people from all age groups. There are many simple things that you can do to make your home safe and to help prevent falls. °WHAT CAN I DO ON THE OUTSIDE OF MY HOME? °· Regularly repair the edges of walkways and driveways and fix any cracks. °· Remove high doorway thresholds. °· Trim any shrubbery on the main path into your home. °· Use bright outdoor lighting. °· Clear walkways of debris and clutter, including tools and rocks. °· Regularly check that handrails are securely fastened and in good repair. Both sides of any steps should have handrails. °· Install guardrails along the edges of any raised decks or porches. °· Have leaves, snow, and ice cleared regularly. °· Use sand or salt on walkways during winter months. °· In the garage, clean up any spills right away, including grease or oil spills. °WHAT CAN I DO IN THE BATHROOM? °· Use night lights. °· Install grab bars by the toilet and in the tub and shower. Do not use towel bars as grab bars. °· Use non-skid mats or decals on the floor of the tub or shower. °· If you need to sit down while you are in the shower, use a plastic, non-slip stool.. °· Keep the floor dry. Immediately clean up any water that spills on the floor. °· Remove soap buildup in the tub or shower on a regular basis. °· Attach bath mats securely with double-sided non-slip rug tape. °· Remove throw rugs and other tripping hazards from the floor. °WHAT CAN I DO IN THE BEDROOM? °· Use night lights. °· Make sure that a bedside light is easy to reach. °· Do not use oversized bedding that drapes onto the floor. °· Have a firm chair that has side arms to use for getting dressed. °· Remove throw rugs and other tripping hazards from the floor. °WHAT CAN I  DO IN THE KITCHEN?  °· Clean up any spills right away. °· Avoid walking on wet floors. °· Place frequently used items in easy-to-reach places. °· If you need to reach for something above you, use a sturdy step stool that has a grab bar. °· Keep electrical cables out of the way. °· Do not use floor polish or wax that makes floors slippery. If you have to use wax, make sure that it is non-skid floor wax. °· Remove throw rugs and other tripping hazards from the floor. °WHAT CAN I DO IN THE STAIRWAYS? °· Do not leave any items on the stairs. °· Make sure that there are handrails on both sides of the stairs. Fix handrails that are broken or loose. Make sure that handrails are as long as the stairways. °· Check any carpeting to make sure that it is firmly attached to the stairs. Fix any carpet that is loose or worn. °· Avoid having throw rugs at the top or bottom of stairways, or secure the rugs with carpet tape to prevent them from moving. °· Make sure that you have a light switch at the top of the stairs and the bottom of the stairs. If you do not have them, have them installed. °WHAT ARE SOME OTHER FALL PREVENTION TIPS? °· Wear closed-toe shoes that fit well and support your feet. Wear shoes that have rubber soles or low   heels. °· When you use a stepladder, make sure that it is completely opened and that the sides are firmly locked. Have someone hold the ladder while you are using it. Do not climb a closed stepladder. °· Add color or contrast paint or tape to grab bars and handrails in your home. Place contrasting color strips on the first and last steps. °· Use mobility aids as needed, such as canes, walkers, scooters, and crutches. °· Turn on lights if it is dark. Replace any light bulbs that burn out. °· Set up furniture so that there are clear paths. Keep the furniture in the same spot. °· Fix any uneven floor surfaces. °· Choose a carpet design that does not hide the edge of steps of a stairway. °· Be aware of any  and all pets. °· Review your medicines with your healthcare provider. Some medicines can cause dizziness or changes in blood pressure, which increase your risk of falling. °Talk with your health care provider about other ways that you can decrease your risk of falls. This may include working with a physical therapist or trainer to improve your strength, balance, and endurance. °  °This information is not intended to replace advice given to you by your health care provider. Make sure you discuss any questions you have with your health care provider. °  °Document Released: 02/03/2002 Document Revised: 06/30/2014 Document Reviewed: 03/20/2014 °Elsevier Interactive Patient Education ©2016 [employer] Inc[employer] ° °

## 2015-01-06 NOTE — ED Notes (Signed)
Pt waiting for [facility].

## 2015-01-06 NOTE — ED Notes (Signed)
Pt arrived by EMS with C/O right buttocks pain, post fall at home. Pt reports drinking 2 beers today and uses ETOH daily.

## 2015-01-07 ENCOUNTER — Telehealth: Payer: Self-pay | Admitting: Family Medicine

## 2015-01-07 NOTE — Telephone Encounter (Signed)
pts friend would like to know whats the results of xray and blood work done yesterday. Pt can't remember what was told. ( ms davis is on dpr)

## 2015-01-07 NOTE — Telephone Encounter (Signed)
Forward to provider

## 2015-01-12 NOTE — Telephone Encounter (Signed)
Notified patients friend of response.

## 2015-01-12 NOTE — Telephone Encounter (Signed)
Please let her know the following:  X-ray of her hip- no fracture, just the past surgery Labs- urine normal, electrolyte normal, CBC normal, EKG no change from prior.   ER said that she was drinking beer and fell on her bottom, but that everything looked OK

## 2015-01-28 ENCOUNTER — Other Ambulatory Visit: Payer: Self-pay

## 2015-01-28 DIAGNOSIS — I10 Essential (primary) hypertension: Secondary | ICD-10-CM

## 2015-01-28 NOTE — Patient Outreach (Signed)
[facility]) Care Management  [id number]  BEKKI MAN [id number]   Telephone Screen  Referral Date:01/27/15 Referral Source: [facility] tier 4 list Referral Reason: "diagnosis not listed with 1 ED visit and 3 admits"   Outreach attempt # 1 to patient. Patient reached and screening completed.  Social: patient resides in her home alone. She states that she has several friends/neighbors that check on her and assist her with her care needs. Patient reported that she is unable to ambulate long distances due to SOB. DME in the home included BSC, cane and medical alert. Patient reports that she fell a few weeks ago. She was unable to recall what led to the fall. Per ER notes patient had been drinking alcohol. She denies this fact.  Conditions: Patient poor historian when discussing medical history. She does have h/o HLD and HTN-controlled without medication usage at this time.   Medications: She denies any issues with meds. Patient reported that she was only taking Tylenol prn at present and no other meds. No issues with affordability.  Appointments: Patient saw PCP on 12/24/14. Flu vaccine given on 11/17/14 Consent: She gave verbal consent for [facility] services.  Plan: RN CM will notify [facility] administrative assistant that patient agreed to services and case opened. RN CM will send [facility] community referral as patient has been in ER within last 30 days. RN CM provided patient with [facility] contact info.  Enzo Montgomery, RN,BSN,CCM [place] [facility] Telephonic Care Management Coordinator Direct Phone: [phone] Toll Free: [phone] Fax: [phone]

## 2015-02-01 ENCOUNTER — Other Ambulatory Visit: Payer: Self-pay | Admitting: *Deleted

## 2015-02-01 ENCOUNTER — Encounter: Payer: Self-pay | Admitting: *Deleted

## 2015-02-01 NOTE — Patient Outreach (Signed)
[facility]) Care Management  [id number]  Alyssa Ortega [id number]   Referral from [facility]  Initial telephone call to Alyssa Ortega, patient reports that she is doing pretty good on today except she complained of pain in her feet and hips  that she has had on and off for a while. Patient reports that the only medication that she takes is tylenol since the doctor has stopped her blood pressure medication.  Alyssa Ortega reports that she uses a cane and that physical therapy visited her on today,patient was unable to recall the name of the agency, patient discussed how she was able to walk outside today with the therapist and using her cane. Alyssa Ortega states that she lives alone, she doesn't drive any longer and her friend/neighbors provide transportation to appointments.  Alyssa Ortega denied concerns or needs at present, encouraged her to notify her primary care provider [facility] for medical  concerns , patient stated she understands and that she expressed how she really appreciated the care she showed. Patient is able to recall when her next office visit is in January 2017. Provided patient with my contact information and ok to notify for needs.  Plan RN CM scheduled home visit for Friday,December 9 at 12:30 to assess for further care management needs and home safety evaluation.  Alyssa Draft, RN, [place] [facility] [phone]- Mobile [phone]- Toll Free Main Office

## 2015-02-02 ENCOUNTER — Encounter: Payer: Self-pay | Admitting: Emergency Medicine

## 2015-02-02 ENCOUNTER — Emergency Department: Payer: Commercial Managed Care - HMO

## 2015-02-02 ENCOUNTER — Emergency Department
Admission: EM | Admit: 2015-02-02 | Discharge: 2015-02-02 | Disposition: A | Discharge status: Home / Self Care | Payer: Commercial Managed Care - HMO | Attending: Emergency Medicine | Admitting: Emergency Medicine

## 2015-02-02 DIAGNOSIS — I10 Essential (primary) hypertension: Secondary | ICD-10-CM | POA: Diagnosis not present

## 2015-02-02 DIAGNOSIS — Z87891 Personal history of nicotine dependence: Secondary | ICD-10-CM | POA: Insufficient documentation

## 2015-02-02 DIAGNOSIS — R531 Weakness: Secondary | ICD-10-CM | POA: Diagnosis not present

## 2015-02-02 DIAGNOSIS — Z79899 Other long term (current) drug therapy: Secondary | ICD-10-CM | POA: Diagnosis not present

## 2015-02-02 DIAGNOSIS — E871 Hypo-osmolality and hyponatremia: Secondary | ICD-10-CM

## 2015-02-02 DIAGNOSIS — F10129 Alcohol abuse with intoxication, unspecified: Secondary | ICD-10-CM | POA: Diagnosis not present

## 2015-02-02 DIAGNOSIS — M25552 Pain in left hip: Secondary | ICD-10-CM

## 2015-02-02 LAB — URINALYSIS COMPLETE WITH MICROSCOPIC (ARMC ONLY)
BACTERIA UA: NONE SEEN
Bilirubin Urine: NEGATIVE
Glucose, UA: NEGATIVE mg/dL
Hgb urine dipstick: NEGATIVE
Ketones, ur: NEGATIVE mg/dL
LEUKOCYTES UA: NEGATIVE
Nitrite: NEGATIVE
PROTEIN: NEGATIVE mg/dL
RBC / HPF: NONE SEEN RBC/hpf (ref 0–5)
Specific Gravity, Urine: 1.003 — ABNORMAL LOW (ref 1.005–1.030)
pH: 6 (ref 5.0–8.0)

## 2015-02-02 LAB — CBC
HCT: 40.3 % (ref 35.0–47.0)
Hemoglobin: 13.5 g/dL (ref 12.0–16.0)
MCH: 31.6 pg (ref 26.0–34.0)
MCHC: 33.5 g/dL (ref 32.0–36.0)
MCV: 94.5 fL (ref 80.0–100.0)
PLATELETS: 165 10*3/uL (ref 150–440)
RBC: 4.26 MIL/uL (ref 3.80–5.20)
RDW: 14.3 % (ref 11.5–14.5)
WBC: 5.9 10*3/uL (ref 3.6–11.0)

## 2015-02-02 LAB — BASIC METABOLIC PANEL
Anion gap: 9 (ref 5–15)
CHLORIDE: 92 mmol/L — AB (ref 101–111)
CO2: 25 mmol/L (ref 22–32)
CREATININE: 0.48 mg/dL (ref 0.44–1.00)
Calcium: 8.6 mg/dL — ABNORMAL LOW (ref 8.9–10.3)
GFR calc Af Amer: >60 mL/min (ref >60)
GLUCOSE: 90 mg/dL (ref 65–99)
POTASSIUM: 4.6 mmol/L (ref 3.5–5.1)
SODIUM: 126 mmol/L — AB (ref 135–145)

## 2015-02-02 LAB — ETHANOL: ALCOHOL ETHYL (B): 229 mg/dL — AB (ref <5)

## 2015-02-02 NOTE — ED Provider Notes (Signed)
[facility] Emergency Department Provider Note  Time seen: 3:42 PM  I have reviewed the triage vital signs and the nursing notes.   HISTORY  Chief Complaint Alcohol Intoxication and Weakness    HPI Alyssa Ortega is a 81 y.o. female with a past medical history of hypertension, hyperlipidemia, GI bleed, hip fracture in the past, bursitis, who presents the emergency department with left hip pain. According to the patient she was sitting in her recliner today, when she tried to get up she said she could not stand up. When asked why she says she thinks is because of pain in her left hip but she is not sure. Patient admits drinking alcohol today, states she drinks alcohol every day. She lives alone but has a friend who checks on her each day. Uses a cane when ambulating at baseline. Denies any recent falls or injuries. Denies any pain while lying in bed, although she does have pain when moving her left leg. Denies any weakness or numbness. Denies any headache or head injuries.States she did not want to calm, but her friend called EMS.     Past Medical History  Diagnosis Date  . Hyperlipidemia   . Hypertension   . GI bleed 10/2007    secondary to duodenal ulcers  . History of epistaxis     had MRI by Dr. Carlis Abbott  . Hip fracture ([place]) 3/09    left, s/p hemiarthroplasty  . Rib fractures     traumatic  . Insomnia   . Osteoporosis   . Bursitis   . Dehydration   . Gastritis     Patient Active Problem List   Diagnosis Date Noted  . Trochanteric bursitis of right hip 10/20/2014  . Benign essential HTN 08/05/2014  . Hypercholesterolemia without hypertriglyceridemia 08/05/2014  . Cellulitis and abscess of leg 07/01/2014  . Wedge fracture of lumbar vertebra ([place]) 09/30/2013  . Degenerative arthritis of lumbar spine 07/25/2013  . Screening for breast cancer 12/22/2010  . Alcohol abuse, episodic 12/22/2010  . Weight loss, non-intentional 12/22/2010  . Tinnitus of  left ear 12/22/2010  . Thrombocytopenia ([place]) 12/22/2010  . Osteoporosis with fracture   . Rib fractures   . History of epistaxis   . GI bleed 10/29/2007    Past Surgical History  Procedure Laterality Date  . Hemiarthroplasty hip  3/09, 10/11    left hip  . Joint replacement  2009    Left hip  . Abdominal hysterectomy    . Back surgery      Current Outpatient Rx  Name  Route  Sig  Dispense  Refill  . acetaminophen (TYLENOL) 650 MG CR tablet   Oral   Take 650-1,300 mg by mouth daily as needed for pain.         Marland Kitchen doxycycline (VIBRA-TABS) 100 MG tablet   Oral   Take 1 tablet (100 mg total) by mouth 2 (two) times daily. Patient not taking: Reported on 01/28/2015   20 tablet   0   . feeding supplement, ENSURE ENLIVE, (ENSURE ENLIVE) LIQD   Oral   Take 237 mLs by mouth 2 (two) times daily between meals.   237 mL   12   . lidocaine (XYLOCAINE) 5 % ointment   Topical   Apply 1 application topically as needed. Patient not taking: Reported on 01/28/2015   35.44 g   0     Allergies Aspirin  Family History  Problem Relation Age of Onset  . Cancer Son  40    colon  . Arthritis Brother     Social History Social History  Substance Use Topics  . Smoking status: Former Smoker    Types: Cigarettes    Quit date: 12/21/1990  . Smokeless tobacco: Never Used  . Alcohol Use: Yes     Comment: occasional    Review of Systems Constitutional: Negative for fever. Cardiovascular: Negative for chest pain. Respiratory: Negative for shortness of breath. Gastrointestinal: Negative for abdominal pain Genitourinary: Negative for dysuria. Musculoskeletal: Negative for back pain. Positive left hip pain. Neurological: Negative for headache 10-point ROS otherwise negative.  ____________________________________________   PHYSICAL EXAM:  VITAL SIGNS: ED Triage Vitals  Enc Vitals Group     BP 02/02/15 1533 119/78 mmHg     Pulse Rate 02/02/15 1533 87     Resp 02/02/15 1533  19     Temp 02/02/15 1533 98 F (36.7 C)     Temp Source 02/02/15 1533 Oral     SpO2 02/02/15 1533 95 %     Weight 02/02/15 1533 120 lb (54.432 kg)     Height 02/02/15 1533 5\' 9"  (1.753 m)     Head Cir --      Peak Flow --      Pain Score 02/02/15 1534 5     Pain Loc --      Pain Edu? --      Excl. in [place]? --     Constitutional: Alert and oriented. Well appearing and in no distress. Eyes: Normal exam ENT   Head: Normocephalic and atraumatic   Mouth/Throat: Mucous membranes are moist. Cardiovascular: Normal rate, regular rhythm. No murmur Respiratory: Normal respiratory effort without tachypnea nor retractions. Breath sounds are clear and equal bilaterally. No wheezes/rales/rhonchi. Gastrointestinal: Soft and nontender. No distention.  Musculoskeletal: Moderate left-sided tenderness palpation, mild right hip tenderness palpation, no apparent pain with right hip range of motion, moderate apparent pain with left hip range of motion. Warm extremities bilaterally, neurovascularly intact. Neurologic:  Normal speech and language. No gross focal neurologic deficits  Skin:  Skin is warm, dry and intact.  Psychiatric: Mood and affect are normal. Speech and behavior are normal.   ____________________________________________   RADIOLOGY  Hip x-ray shows no acute abnormality.   EKG reviewed and interpreted by myself shows normal sinus rhythm at 86 bpm, borderline widened QRS, left axis deviation, intervals otherwise within normal limits, nonspecific ST changes. ____________________________________________    INITIAL IMPRESSION / ASSESSMENT AND PLAN / ED COURSE  Pertinent labs & imaging results that were available during my care of the patient were reviewed by me and considered in my medical decision making (see chart for details).  Patient presents the emergency department with a complaint of unable to stand from her recliner. It is not entirely sure why the patient could not  stand, she says she thinks it was because of pain in her left hip that she is not sure. States she believes she could stand now. Patient doesn't moderate tenderness palpation of the left hip including with range of motion. Appears to be neurovascularly intact distally. We will check basic labs, x-ray of the left hip, and monitor in the emergency department. Patient states a history of chronic hip pain due to bursitis. The patient's workup is within normal limits we will attempt ambulation in the emergency department. Patient has no specific complaints at this time. Does admit alcohol intake today.  Labs are largely within normal limits besides hyponatremia. This is likely due to the  patient's alcohol intake. Patient has been hyponatremic in the past that do not believe this is due to abnormally low for the patient. Patient's x-rays within normal limits. We will attempt to ambulate the patient in the emergency department. The patient can ambulate we will discharge home with primary care follow-up tomorrow.  Patient ambulates without difficulty, is literally dancing around the room. We will discharge the patient home with primary care follow-up for recheck of her sodium.  ____________________________________________   FINAL CLINICAL IMPRESSION(S) / ED DIAGNOSES  Left hip pain  Hyponatremia    Harvest Dark, MD 02/02/15 1902

## 2015-02-02 NOTE — ED Notes (Signed)
Pt from home lives alone has friends that comes to check on her frequently. Pt states she doesn't want to be here however ems states she is here because she told her friend should couldn't get out of the chair and to call 911. Pt states she has had 2 40oz beers today. Friend told ems she has about 6 of those a day often drinks beer but does drink liquor as well. Pt a&o x4

## 2015-02-02 NOTE — Discharge Instructions (Signed)
You have been seen in the emergency department today for left hip pain. Your x-ray is normal. Labs show a low sodium level of 126. Please follow-up your primary care physician tomorrow for recheck of this level. Please increase her salt intake in your diet, and decrease alcohol intake. Return to the emergency department for any acutely concerning issues.   Hyponatremia Hyponatremia is when the amount of salt (sodium) in your blood is too low. When sodium levels are low, your cells absorb extra water and they swell. The swelling happens throughout the body, but it mostly affects the brain. CAUSES This condition may be caused by:  Heart, kidney, or liver problems.  Thyroid problems.  Adrenal gland problems.  Metabolic conditions, such as syndrome of inappropriate antidiuretic hormone (SIADH).  Severe vomiting and diarrhea.  Certain medicines or illegal drugs.  Dehydration.  Drinking too much water.  Eating a diet that is low in sodium.  Large burns on your body.  Sweating. RISK FACTORS This condition is more likely to develop in people who:  Have long-term (chronic) kidney disease.  Have heart failure.  Have a medical condition that causes frequent or excessive diarrhea.  Have metabolic conditions, such as Addison disease or SIADH.  Take certain medicines that affect the sodium and fluid balance in the blood. Some of these medicine types include:  Diuretics.  NSAIDs.  Some opioid pain medicines.  Some antidepressants.  Some seizure prevention medicines. SYMPTOMS  Symptoms of this condition include:  Nausea and vomiting.  Confusion.  Lethargy.  Agitation.  Headache.  Seizures.  Unconsciousness.  Appetite loss.  Muscle weakness and cramping.  Feeling weak or light-headed.  Having a rapid heart rate.  Fainting, in severe cases. DIAGNOSIS This condition is diagnosed with a medical history and physical exam. You will also have other tests,  including:  Blood tests.  Urine tests. TREATMENT Treatment for this condition depends on the cause. Treatment may include:  Fluids given through an IV tube that is inserted into one of your veins.  Medicines to correct the sodium imbalance. If medicines are causing the condition, the medicines will need to be adjusted.  Limiting water or fluid intake to get the correct sodium balance. HOME CARE INSTRUCTIONS  Take medicines only as directed by your health care provider. Many medicines can make this condition worse. Talk with your health care provider about any medicines that you are currently taking.  Carefully follow a recommended diet as directed by your health care provider.  Carefully follow instructions from your health care provider about fluid restrictions.  Keep all follow-up visits as directed by your health care provider. This is important.  Do not drink alcohol. SEEK MEDICAL CARE IF:  You develop worsening nausea, fatigue, headache, confusion, or weakness.  Your symptoms go away and then return.  You have problems following the recommended diet. SEEK IMMEDIATE MEDICAL CARE IF:  You have a seizure.  You faint.  You have ongoing diarrhea or vomiting.   This information is not intended to replace advice given to you by your health care provider. Make sure you discuss any questions you have with your health care provider.   Document Released: 02/03/2002 Document Revised: 06/30/2014 Document Reviewed: 03/05/2014 Elsevier Interactive Patient Education 2016 Elsevier Inc.  Hip Pain Your hip is the joint between your upper legs and your lower pelvis. The bones, cartilage, tendons, and muscles of your hip joint perform a lot of work each day supporting your body weight and allowing you to move  around. Hip pain can range from a minor ache to severe pain in one or both of your hips. Pain may be felt on the inside of the hip joint near the groin, or the outside near the  buttocks and upper thigh. You may have swelling or stiffness as well.  HOME CARE INSTRUCTIONS   Take medicines only as directed by your health care provider.  Apply ice to the injured area:  Put ice in a plastic bag.  Place a towel between your skin and the bag.  Leave the ice on for 15-20 minutes at a time, 3-4 times a day.  Keep your leg raised (elevated) when possible to lessen swelling.  Avoid activities that cause pain.  Follow specific exercises as directed by your health care provider.  Sleep with a pillow between your legs on your most comfortable side.  Record how often you have hip pain, the location of the pain, and what it feels like. SEEK MEDICAL CARE IF:   You are unable to put weight on your leg.  Your hip is red or swollen or very tender to touch.  Your pain or swelling continues or worsens after 1 week.  You have increasing difficulty walking.  You have a fever. SEEK IMMEDIATE MEDICAL CARE IF:   You have fallen.  You have a sudden increase in pain and swelling in your hip. MAKE SURE YOU:   Understand these instructions.  Will watch your condition.  Will get help right away if you are not doing well or get worse.   This information is not intended to replace advice given to you by your health care provider. Make sure you discuss any questions you have with your health care provider.   Document Released: 08/03/2009 Document Revised: 03/06/2014 Document Reviewed: 10/10/2012 Elsevier Interactive Patient Education [employer].

## 2015-02-04 ENCOUNTER — Ambulatory Visit: Payer: Commercial Managed Care - HMO | Admitting: Family Medicine

## 2015-02-05 ENCOUNTER — Other Ambulatory Visit: Payer: Self-pay | Admitting: *Deleted

## 2015-02-05 ENCOUNTER — Encounter: Payer: Self-pay | Admitting: *Deleted

## 2015-02-05 NOTE — Patient Outreach (Addendum)
[facility]) Care Management   [id number]  Alyssa Ortega [id number]  Alyssa Ortega is an 81 y.o. female  Subjective: " I doing okay today, except for this hip pain"  Objective:   Review of Systems  Constitutional: Negative.   HENT: Negative.   Respiratory: Positive for cough. Negative for wheezing.        Dry cough  Cardiovascular: Negative for chest pain and leg swelling.  Genitourinary: Negative.        Complaint of some incontinence, wears pads or depends   Musculoskeletal: Positive for joint pain.       Left hip pain  Skin: Negative.   Neurological: Negative.   Psychiatric/Behavioral: Negative for depression, suicidal ideas and hallucinations.    Physical Exam  Constitutional: She is oriented to person, place, and time. She appears well-developed and well-nourished.  Cardiovascular: Normal rate.   Murmur heard. Pulses:      Dorsalis pedis pulses are 2+ on the right side, and 2+ on the left side.  Respiratory: Effort normal and breath sounds normal.  GI: Soft.  Musculoskeletal:  Complaint of left hip pain  Neurological: She is alert and oriented to person, place, and time.  Skin: Skin is warm and dry.  Psychiatric: She has a normal mood and affect. Her behavior is normal. Judgment and thought content normal.    Current Medications:   Current Outpatient Prescriptions  Medication Sig Dispense Refill  . acetaminophen (TYLENOL) 650 MG CR tablet Take 650-1,300 mg by mouth daily as needed for pain.    . feeding supplement, ENSURE ENLIVE, (ENSURE ENLIVE) LIQD Take 237 mLs by mouth 2 (two) times daily between meals. 237 mL 12  . doxycycline (VIBRA-TABS) 100 MG tablet Take 1 tablet (100 mg total) by mouth 2 (two) times daily. (Patient not taking: Reported on 01/28/2015) 20 tablet 0  . lidocaine (XYLOCAINE) 5 % ointment Apply 1 application topically as needed. (Patient not taking: Reported on 01/28/2015) 35.44 g 0   Current Facility-Administered  Medications  Medication Dose Route Frequency Provider Last Rate Last Dose  . albuterol (PROVENTIL) (2.5 MG/3ML) 0.083% nebulizer solution 2.5 mg  2.5 mg Nebulization Once Alyssa Roys, DO        Functional Status:   In your present state of health, do you have any difficulty performing the following activities: 02/05/2015 09/07/2014  Hearing? N N  Vision? Y N  Difficulty concentrating or making decisions? Y N  Walking or climbing stairs? Y Y  Dressing or bathing? N Y  Doing errands, shopping? Alyssa Ortega  Preparing Food and eating ? N -  Using the Toilet? N -  In the past six months, have you accidently leaked urine? N -  Do you have problems with loss of bowel control? N -  Managing your Medications? N -  Managing your Finances? Y -  Housekeeping or managing your Housekeeping? Y -   Fall Risk  01/28/2015 08/05/2014  Falls in the past year? Yes Yes  Number falls in past yr: 2 or more 2 or more  Injury with Fall? No Yes  Risk Factor Category  High Fall Risk High Fall Risk  Risk for fall due to : History of fall(s);Impaired balance/gait;Impaired mobility Impaired balance/gait  Follow up Education provided Education provided   Fall/Depression Screening:    PHQ 2/9 Scores 02/05/2015 01/28/2015 08/05/2014  PHQ - 2 Score 0 0 0    Assessment:   Initial home visit to this very pleasant patient. Alyssa Ortega  is still actively participating in [facility] with [facility]. Alyssa Ortega lives at home alone,and has friends and neighbors that check on he regularly.by visiting and by telephone. As noted per EPIC patient had a recent visit to the  ED related to having difficulty getting out of her chair,and left leg pain, patient reported today that she had only 2 beers on that day and she does not have anymore in her home now.  High Fall Risk: Alyssa Ortega reports having a fall a few weeks ago and denies having a fall since then. Alyssa Ortega normally spends most of the day in her living room in  her recliner chair, patient reports that she always sleeps in the recliner at night and keeps a flashlight by her side. Patient uses her life alert button for  emergencies. Patient with shuffling gait when evaluated,patient   has a cane available, but states she does not use it in the home most of the time, she uses it when she goes outside the home,patient noted holding onto furniture during visit. Discussed with patient fall and safety measures, stressed importance of using her cane in the home for fall prevention.  Social: Alyssa Ortega states she does her own cooking, she relies on friends or her daughter for transportation to appointments and shopping. I discussed transportation needs patient declined, she also states that she is not interested in senior meals on wheels or going to senior center for activities. Discussed with  Alyssa Ortega counseling related to her drinking beer, as patient has  had 2 emergency room visits recently after drinking, she  denies need for counseling related to this as she reports that she will not drink anymore.  Alyssa Ortega states that the only medication she takes is tylenol and she does not take more than 4 a day. I have provided Alyssa Ortega with my contact information and encouraged her to contact me for questions or concerns. I assisted Alyssa Ortega  today with making her follow up appointment with PCP, scheduled for 12/14 at 93, patient states that her friend Alyssa Ortega or her daughter Alyssa Ortega will be able to provide transportation.   Plan:  Will initiate patient centered care plan  Patient will attend post ED office visit with PCP, Alyssa Ortega Patient will notify MD for concerns, new symptoms and 911 for emergencies Will send letter of involvement to Dr. Park Ortega Will schedule home visit for January 10 at 14:30  [place] Problem One        Most Recent Value   Care Plan Problem One  High Fall Risk   Role Documenting the Problem One  Care Management  [place] for Problem One  Active   THN Long Term Goal (31-90 days)  Patient will report increase knowledge of fall prevention and safety   THN Long Term Goal Start Date  02/05/15   Interventions for Problem One Long Term Goal  Reviewed safety and fall prevention measures and the importance of following   THN CM Short Term Goal #1 (0-30 days)  Patient will  not experience a fall in the next 30 days   THN CM Short Term Goal #1 Start Date  02/05/15   Interventions for Short Term Goal #1  Discussed with patient importance of using her cane, discussed how alcoholic beverages can impair her gait ,     Joylene Draft, RN, [place] [facility] [phone]- [place] [phone]- [place]

## 2015-02-10 ENCOUNTER — Inpatient Hospital Stay: Payer: Commercial Managed Care - HMO | Admitting: Family Medicine

## 2015-02-19 ENCOUNTER — Encounter: Payer: Self-pay | Admitting: Family Medicine

## 2015-02-19 ENCOUNTER — Ambulatory Visit (INDEPENDENT_AMBULATORY_CARE_PROVIDER_SITE_OTHER): Payer: Commercial Managed Care - HMO | Admitting: Family Medicine

## 2015-02-19 VITALS — BP 159/80 | HR 91 | Temp 98.1°F | Ht 58.4 in | Wt 125.0 lb

## 2015-02-19 DIAGNOSIS — J329 Chronic sinusitis, unspecified: Secondary | ICD-10-CM | POA: Insufficient documentation

## 2015-02-19 DIAGNOSIS — J019 Acute sinusitis, unspecified: Secondary | ICD-10-CM | POA: Diagnosis not present

## 2015-02-19 MED ORDER — AMOXICILLIN-POT CLAVULANATE 875-125 MG PO TABS: 1.0000 | ORAL_TABLET | Freq: Two times a day (BID) | ORAL | Status: DC

## 2015-02-19 NOTE — Progress Notes (Signed)
BP 159/80 mmHg  Pulse 91  Temp(Src) 98.1 F (36.7 C)  Ht 4' 10.4" (1.483 m)  Wt 125 lb (56.7 kg)  BMI 25.78 kg/m2  SpO2 98%  LMP  (LMP Unknown)   Subjective:    Patient ID: Alyssa Ortega, female    DOB: Sep 27, 1930, 81 y.o.   MRN: [id number]  HPI: Alyssa Ortega is a 81 y.o. female  No chief complaint on file.  Patient accompanied by caregivers patient complained of cough cold congestion fever or chills had pressure with bending over is been ongoing for over a week and seemingly getting worse patient able to eat okay and taking fluids without problems been using some over-the-counter medications Tylenol Sinus and some Mucinex Relevant past medical, surgical, family and social history reviewed and updated as indicated. Interim medical history since our last visit reviewed. Allergies and medications reviewed and updated.  Review of Systems  Constitutional: Positive for fever, chills, diaphoresis and fatigue.  HENT: Positive for congestion, ear pain, rhinorrhea, sinus pressure, sneezing and sore throat.   Respiratory: Positive for cough.   Cardiovascular: Negative for chest pain and leg swelling.  Gastrointestinal: Negative.     Per HPI unless specifically indicated above     Objective:    BP 159/80 mmHg  Pulse 91  Temp(Src) 98.1 F (36.7 C)  Ht 4' 10.4" (1.483 m)  Wt 125 lb (56.7 kg)  BMI 25.78 kg/m2  SpO2 98%  LMP  (LMP Unknown)  Wt Readings from Last 3 Encounters:  02/19/15 125 lb (56.7 kg)  02/02/15 120 lb (54.432 kg)  01/06/15 130 lb (58.968 kg)    Physical Exam  Constitutional: She is oriented to person, place, and time. She appears well-developed and well-nourished. No distress.  HENT:  Head: Normocephalic and atraumatic.  Right Ear: Hearing and external ear normal.  Left Ear: Hearing and external ear normal.  Nose: Nose normal.  Mouth/Throat: Oropharyngeal exudate present.  Eyes: Conjunctivae and lids are normal. Right eye exhibits no discharge. Left  eye exhibits no discharge. No scleral icterus.  Cardiovascular: Normal rate, regular rhythm and normal heart sounds.   Pulmonary/Chest: Effort normal. No respiratory distress.  Slight rhonchi  Musculoskeletal: Normal range of motion.  Neurological: She is alert and oriented to person, place, and time.  Skin: Skin is intact. No rash noted.  Psychiatric: She has a normal mood and affect. Her speech is normal and behavior is normal. Judgment and thought content normal. Cognition and memory are normal.    Results for orders placed or performed during the hospital encounter of 02/02/15  CBC  Result Value Ref Range   WBC 5.9 3.6 - 11.0 K/uL   RBC 4.26 3.80 - 5.20 MIL/uL   Hemoglobin 13.5 12.0 - 16.0 g/dL   HCT 40.3 35.0 - 47.0 %   MCV 94.5 80.0 - 100.0 fL   MCH 31.6 26.0 - 34.0 pg   MCHC 33.5 32.0 - 36.0 g/dL   RDW 14.3 11.5 - 14.5 %   Platelets 165 150 - 440 K/uL  Basic metabolic panel  Result Value Ref Range   Sodium 126 (L) 135 - 145 mmol/L   Potassium 4.6 3.5 - 5.1 mmol/L   Chloride 92 (L) 101 - 111 mmol/L   CO2 25 22 - 32 mmol/L   Glucose, Bld 90 65 - 99 mg/dL   BUN <5 (L) 6 - 20 mg/dL   Creatinine, Ser 0.48 0.44 - 1.00 mg/dL   Calcium 8.6 (L) 8.9 - 10.3 mg/dL  GFR calc non Af Amer >60 >60 mL/min   GFR calc Af Amer >60 >60 mL/min   Anion gap 9 5 - 15  Ethanol  Result Value Ref Range   Alcohol, Ethyl (B) 229 (H) <5 mg/dL  Urinalysis complete, with microscopic ([facility] only)  Result Value Ref Range   Color, Urine STRAW (A) YELLOW   APPearance CLEAR (A) CLEAR   Glucose, UA NEGATIVE NEGATIVE mg/dL   Bilirubin Urine NEGATIVE NEGATIVE   Ketones, ur NEGATIVE NEGATIVE mg/dL   Specific Gravity, Urine 1.003 (L) 1.005 - 1.030   Hgb urine dipstick NEGATIVE NEGATIVE   pH 6.0 5.0 - 8.0   Protein, ur NEGATIVE NEGATIVE mg/dL   Nitrite NEGATIVE NEGATIVE   Leukocytes, UA NEGATIVE NEGATIVE   RBC / HPF NONE SEEN 0 - 5 RBC/hpf   WBC, UA 0-5 0 - 5 WBC/hpf   Bacteria, UA NONE SEEN NONE  SEEN   Squamous Epithelial / LPF 0-5 (A) NONE SEEN      Assessment & Plan:   Problem List Items Addressed This Visit      Respiratory   Sinusitis - Primary    Discussed sinusitis care and treatment with patient and caregivers that are with her today will use Augmentin discuss ways to mitigate GI effects Discuss Mucinex and Tylenol      Relevant Medications   amoxicillin-clavulanate (AUGMENTIN) 875-125 MG tablet       Follow up plan: Return if symptoms worsen or fail to improve, for As scheduled.

## 2015-02-19 NOTE — Assessment & Plan Note (Signed)
Discussed sinusitis care and treatment with patient and caregivers that are with her today will use Augmentin discuss ways to mitigate GI effects Discuss Mucinex and Tylenol

## 2015-03-08 ENCOUNTER — Other Ambulatory Visit: Payer: Self-pay | Admitting: *Deleted
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
## 2015-03-09 ENCOUNTER — Ambulatory Visit: Payer: Self-pay | Admitting: *Deleted

## 2015-03-11 ENCOUNTER — Ambulatory Visit: Payer: Commercial Managed Care - HMO | Admitting: Family Medicine

## 2015-03-15 ENCOUNTER — Other Ambulatory Visit: Payer: Self-pay | Admitting: *Deleted

## 2015-03-15 ENCOUNTER — Ambulatory Visit: Payer: Self-pay | Admitting: *Deleted

## 2015-03-15 ENCOUNTER — Encounter: Payer: Self-pay | Admitting: Emergency Medicine

## 2015-03-15 ENCOUNTER — Other Ambulatory Visit: Payer: Self-pay

## 2015-03-15 ENCOUNTER — Emergency Department: Payer: Commercial Managed Care - HMO

## 2015-03-15 ENCOUNTER — Emergency Department
Admission: EM | Admit: 2015-03-15 | Discharge: 2015-03-15 | Disposition: A | Discharge status: Home / Self Care | Payer: Commercial Managed Care - HMO | Attending: Emergency Medicine | Admitting: Emergency Medicine

## 2015-03-15 DIAGNOSIS — Z792 Long term (current) use of antibiotics: Secondary | ICD-10-CM | POA: Diagnosis not present

## 2015-03-15 DIAGNOSIS — I10 Essential (primary) hypertension: Secondary | ICD-10-CM | POA: Insufficient documentation

## 2015-03-15 DIAGNOSIS — Z87891 Personal history of nicotine dependence: Secondary | ICD-10-CM | POA: Diagnosis not present

## 2015-03-15 DIAGNOSIS — Z79899 Other long term (current) drug therapy: Secondary | ICD-10-CM | POA: Insufficient documentation

## 2015-03-15 DIAGNOSIS — N390 Urinary tract infection, site not specified: Secondary | ICD-10-CM | POA: Insufficient documentation

## 2015-03-15 DIAGNOSIS — J9811 Atelectasis: Secondary | ICD-10-CM | POA: Insufficient documentation

## 2015-03-15 DIAGNOSIS — R05 Cough: Secondary | ICD-10-CM | POA: Diagnosis present

## 2015-03-15 DIAGNOSIS — R319 Hematuria, unspecified: Secondary | ICD-10-CM

## 2015-03-15 LAB — URINALYSIS COMPLETE WITH MICROSCOPIC (ARMC ONLY)
Bilirubin Urine: NEGATIVE
GLUCOSE, UA: NEGATIVE mg/dL
Ketones, ur: NEGATIVE mg/dL
Nitrite: NEGATIVE
PROTEIN: NEGATIVE mg/dL
SPECIFIC GRAVITY, URINE: 1.018 (ref 1.005–1.030)
pH: 5 (ref 5.0–8.0)

## 2015-03-15 LAB — BASIC METABOLIC PANEL
Anion gap: 9 (ref 5–15)
BUN: 6 mg/dL (ref 6–20)
CHLORIDE: 96 mmol/L — AB (ref 101–111)
CO2: 26 mmol/L (ref 22–32)
Calcium: 9.2 mg/dL (ref 8.9–10.3)
Creatinine, Ser: 0.44 mg/dL (ref 0.44–1.00)
GFR calc Af Amer: >60 mL/min (ref >60)
GFR calc non Af Amer: >60 mL/min (ref >60)
Glucose, Bld: 125 mg/dL — ABNORMAL HIGH (ref 65–99)
POTASSIUM: 3.9 mmol/L (ref 3.5–5.1)
SODIUM: 131 mmol/L — AB (ref 135–145)

## 2015-03-15 LAB — CBC WITH DIFFERENTIAL/PLATELET
BASOS ABS: 0 10*3/uL (ref 0–0.1)
BASOS PCT: 0 %
Eosinophils Absolute: 0 10*3/uL (ref 0–0.7)
Eosinophils Relative: 0 %
HCT: 42.9 % (ref 35.0–47.0)
Hemoglobin: 14.1 g/dL (ref 12.0–16.0)
LYMPHS ABS: 0.5 10*3/uL — AB (ref 1.0–3.6)
LYMPHS PCT: 7 %
MCH: 30.6 pg (ref 26.0–34.0)
MCHC: 33 g/dL (ref 32.0–36.0)
MCV: 92.9 fL (ref 80.0–100.0)
MONOS PCT: 9 %
Monocytes Absolute: 0.6 10*3/uL (ref 0.2–0.9)
NEUTROS PCT: 84 %
Neutro Abs: 6 10*3/uL (ref 1.4–6.5)
PLATELETS: 118 10*3/uL — AB (ref 150–440)
RBC: 4.62 MIL/uL (ref 3.80–5.20)
RDW: 14.4 % (ref 11.5–14.5)
WBC: 7.2 10*3/uL (ref 3.6–11.0)

## 2015-03-15 MED ORDER — ALBUTEROL SULFATE (2.5 MG/3ML) 0.083% IN NEBU
INHALATION_SOLUTION | RESPIRATORY_TRACT | Status: AC
  Filled 2015-03-15: qty 3

## 2015-03-15 MED ORDER — GUAIFENESIN-CODEINE 100-10 MG/5ML PO SOLN: 10.0000 mL | ORAL | Status: DC | PRN

## 2015-03-15 MED ORDER — LEVOFLOXACIN 250 MG PO TABS: 250.0000 mg | ORAL_TABLET | Freq: Every day | ORAL | Status: DC

## 2015-03-15 NOTE — ED Notes (Signed)
Pt with nonproductive cough for a couple of days.

## 2015-03-15 NOTE — Discharge Instructions (Signed)
Urinary Tract Infection Urinary tract infections (UTIs) can develop anywhere along your urinary tract. Your urinary tract is your body's drainage system for removing wastes and extra water. Your urinary tract includes two kidneys, two ureters, a bladder, and a urethra. Your kidneys are a pair of bean-shaped organs. Each kidney is about the size of your fist. They are located below your ribs, one on each side of your spine. CAUSES Infections are caused by microbes, which are microscopic organisms, including fungi, viruses, and bacteria. These organisms are so small that they can only be seen through a microscope. Bacteria are the microbes that most commonly cause UTIs. SYMPTOMS  Symptoms of UTIs may vary by age and gender of the patient and by the location of the infection. Symptoms in young women typically include a frequent and intense urge to urinate and a painful, burning feeling in the bladder or urethra during urination. Older women and men are more likely to be tired, shaky, and weak and have muscle aches and abdominal pain. A fever may mean the infection is in your kidneys. Other symptoms of a kidney infection include pain in your back or sides below the ribs, nausea, and vomiting. DIAGNOSIS To diagnose a UTI, your caregiver will ask you about your symptoms. Your caregiver will also ask you to provide a urine sample. The urine sample will be tested for bacteria and white blood cells. White blood cells are made by your body to help fight infection. TREATMENT  Typically, UTIs can be treated with medication. Because most UTIs are caused by a bacterial infection, they usually can be treated with the use of antibiotics. The choice of antibiotic and length of treatment depend on your symptoms and the type of bacteria causing your infection. HOME CARE INSTRUCTIONS  If you were prescribed antibiotics, take them exactly as your caregiver instructs you. Finish the medication even if you feel better after  you have only taken some of the medication.  Drink enough water and fluids to keep your urine clear or pale yellow.  Avoid caffeine, tea, and carbonated beverages. They tend to irritate your bladder.  Empty your bladder often. Avoid holding urine for long periods of time.  Empty your bladder before and after sexual intercourse.  After a bowel movement, women should cleanse from front to back. Use each tissue only once. SEEK MEDICAL CARE IF:   You have back pain.  You develop a fever.  Your symptoms do not begin to resolve within 3 days. SEEK IMMEDIATE MEDICAL CARE IF:   You have severe back pain or lower abdominal pain.  You develop chills.  You have nausea or vomiting.  You have continued burning or discomfort with urination. MAKE SURE YOU:   Understand these instructions.  Will watch your condition.  Will get help right away if you are not doing well or get worse.   This information is not intended to replace advice given to you by your health care provider. Make sure you discuss any questions you have with your health care provider.   Document Released: 11/23/2004 Document Revised: 11/04/2014 Document Reviewed: 03/24/2011 Elsevier Interactive Patient Education 2016 Elsevier Inc.  Atelectasis, Adult Atelectasis is a collapse of the small air sacs in the lungs (alveoli). When this occurs, all or part of a lung collapses and becomes airless. It can be caused by various things and is a common problem after surgery. The severity of atelectasis will vary depending on the size of the area involved and the underlying cause  of the condition. CAUSES  There are multiple causes for atelectasis:   Shallow breathing, particularly if there is an injury to your chest wall or abdomen that makes it painful to take a deep breath. This commonly occurs after surgery.  Obstruction of your airways (bronchi or bronchioles). This may be caused by a buildup of mucus (mucus plug), tumors,  blood clots (pulmonary embolus), or inhaled foreign bodies. Mucus plugs occur when the lungs do not expand enough to get rid of mucus.  Outside pressure on the lung. This may be caused by tumors, fluid (pleural effusion), or a leakage of air between the lung and rib cage (pneumothorax).   Infections such as pneumonia.  Scarring in lung tissue left over from previous infection or injury.  Some diseases such as cystic fibrosis. SIGNS AND SYMPTOMS  Often, atelectasis will have no symptoms. When symptoms occur, they include:  Shortness of breath.   Bluish color to your nails, lips, or mouth (cyanosis). DIAGNOSIS  Your health care provider may suspect atelectasis based on symptoms and physical findings. A chest X-ray may be done to confirm the diagnosis. More specialized X-ray exams are sometimes required.  TREATMENT  Treatment will depend on the cause of the atelectasis. Treatment may include:  Purposeful coughing to loosen mucus plugs in the lungs.  Chest physiotherapy. This consists of clapping or percussion on the chest over the lungs to further loosen mucus plugs.  Postural drainage techniques. This involves positioning your body so your head is lower than your chest. [place] relaxed deep breathing whenever you are sitting down. A good technique is to take a few relaxed deep breaths each time a commercial comes on if you are watching television.  If you were given a deep breathing device (such as an incentive spirometer) or a mucus clearance device, use this regularly as directed by your health care provider.  Try to cough several times a day as directed by your health care provider.  Perform any chest physiotherapy or postural drainage techniques as directed by your health care provider. If necessary, have someone (such as a family member) assist you with these techniques.  When lying down, lie on the unaffected side to encourage mucus drainage.  Stay  physically active as much as possible. SEEK IMMEDIATE MEDICAL CARE IF:   You develop increasing problems with your breathing.   You develop severe chest pain.   You develop severe coughing, or you cough up blood.   You have a fever or persistent symptoms for more than 2-3 days.   You have a fever and your symptoms suddenly get worse.  MAKE SURE YOU:  Understand these instructions.  Will watch your condition.  Will get help right away if you are not doing well or get worse.   This information is not intended to replace advice given to you by your health care provider. Make sure you discuss any questions you have with your health care provider.   Document Released: 02/13/2005 Document Revised: 03/06/2014 Document Reviewed: 08/21/2012 Elsevier Interactive Patient Education [employer].

## 2015-03-15 NOTE — ED Provider Notes (Signed)
[facility] Emergency Department Provider Note  ____________________________________________  Time seen: Approximately 1:26 PM  I have reviewed the triage vital signs and the nursing notes.   HISTORY  Chief Complaint Cough    HPI Alyssa Ortega is a 82 y.o. female presents for evaluation of cough congestion and a little bit of burning with urination. States, aches all over. She reports that her chest hurts and her feet are swelling.   Past Medical History  Diagnosis Date  . Hyperlipidemia   . Hypertension   . GI bleed 10/2007    secondary to duodenal ulcers  . History of epistaxis     had MRI by Dr. Carlis Abbott  . Hip fracture ([place]) 3/09    left, s/p hemiarthroplasty  . Rib fractures     traumatic  . Insomnia   . Osteoporosis   . Bursitis   . Dehydration   . Gastritis     Patient Active Problem List   Diagnosis Date Noted  . Sinusitis 02/19/2015  . Trochanteric bursitis of right hip 10/20/2014  . Benign essential HTN 08/05/2014  . Hypercholesterolemia without hypertriglyceridemia 08/05/2014  . Cellulitis and abscess of leg 07/01/2014  . Wedge fracture of lumbar vertebra ([place]) 09/30/2013  . Degenerative arthritis of lumbar spine 07/25/2013  . Screening for breast cancer 12/22/2010  . Alcohol abuse, episodic 12/22/2010  . Weight loss, non-intentional 12/22/2010  . Tinnitus of left ear 12/22/2010  . Thrombocytopenia ([place]) 12/22/2010  . Osteoporosis with fracture   . Rib fractures   . History of epistaxis   . GI bleed 10/29/2007    Past Surgical History  Procedure Laterality Date  . Hemiarthroplasty hip  3/09, 10/11    left hip  . Joint replacement  2009    Left hip  . Abdominal hysterectomy    . Back surgery      Current Outpatient Rx  Name  Route  Sig  Dispense  Refill  . acetaminophen (TYLENOL) 650 MG CR tablet   Oral   Take 650-1,300 mg by mouth daily as needed for pain.         Marland Kitchen amoxicillin-clavulanate (AUGMENTIN) 875-125  MG tablet   Oral   Take 1 tablet by mouth 2 (two) times daily.   20 tablet   0   . feeding supplement, ENSURE ENLIVE, (ENSURE ENLIVE) LIQD   Oral   Take 237 mLs by mouth 2 (two) times daily between meals.   237 mL   12   . guaiFENesin-codeine 100-10 MG/5ML syrup   Oral   Take 10 mLs by mouth every 4 (four) hours as needed for cough.   180 mL   0   . levofloxacin (LEVAQUIN) 250 MG tablet   Oral   Take 1 tablet (250 mg total) by mouth daily.   10 tablet   0   . lidocaine (XYLOCAINE) 5 % ointment   Topical   Apply 1 application topically as needed.   35.44 g   0     Allergies Aspirin  Family History  Problem Relation Age of Onset  . Cancer Son 40    colon  . Arthritis Brother     Social History Social History  Substance Use Topics  . Smoking status: Former Smoker    Types: Cigarettes    Quit date: 12/21/1990  . Smokeless tobacco: Never Used  . Alcohol Use: Yes     Comment: occasional    Review of Systems Constitutional: No fever/chills Eyes: No visual changes.  ENT: No sore throat. Cardiovascular: Positive chest pain. Respiratory: Positive shortness of breath. Gastrointestinal: No abdominal pain.  No nausea, no vomiting.  No diarrhea.  No constipation. Genitourinary: Positive for dysuria. Musculoskeletal: Positive for back pain. Skin: Negative for rash. Neurological: Negative for headaches, focal weakness or numbness.  10-point ROS otherwise negative.  ____________________________________________   PHYSICAL EXAM:  VITAL SIGNS: ED Triage Vitals  Enc Vitals Group     BP 03/15/15 1304 143/82 mmHg     Pulse Rate 03/15/15 1304 102     Resp 03/15/15 1304 20     Temp 03/15/15 1304 98.4 F (36.9 C)     Temp Source 03/15/15 1304 Oral     SpO2 03/15/15 1304 95 %     Weight 03/15/15 1304 130 lb (58.968 kg)     Height 03/15/15 1304 5\' 4"  (1.626 m)     Head Cir --      Peak Flow --      Pain Score 03/15/15 1305 10     Pain Loc --      Pain Edu?  --      Excl. in [place]? --     Constitutional: Alert and oriented. Well appearing and in no acute distress. Eyes: Conjunctivae are normal. PERRL. EOMI. Head: Atraumatic. Nose: No congestion/rhinnorhea. Mouth/Throat: Mucous membranes are moist.  Oropharynx non-erythematous. Neck: No stridor.   Cardiovascular: Normal rate, regular rhythm. Grossly normal heart sounds.  Good peripheral circulation. Respiratory: Normal respiratory effort.  No retractions. Lungs decreased breath sounds bilaterally. Gastrointestinal: Soft and nontender. No distention. No abdominal bruits. No CVA tenderness. Musculoskeletal: No lower extremity tenderness nor edema.  No joint effusions. Neurologic:  Normal speech and language. No gross focal neurologic deficits are appreciated. No gait instability. Skin:  Skin is warm, dry and intact. No rash noted. Psychiatric: Mood and affect are normal. Speech and behavior are normal.  ____________________________________________   LABS (all labs ordered are listed, but only abnormal results are displayed)  Labs Reviewed  BASIC METABOLIC PANEL - Abnormal; Notable for the following:    Sodium 131 (*)    Chloride 96 (*)    Glucose, Bld 125 (*)    All other components within normal limits  CBC WITH DIFFERENTIAL/PLATELET - Abnormal; Notable for the following:    Platelets 118 (*)    Lymphs Abs 0.5 (*)    All other components within normal limits  URINALYSIS COMPLETEWITH MICROSCOPIC ([facility] ONLY) - Abnormal; Notable for the following:    Color, Urine YELLOW (*)    APPearance CLEAR (*)    Hgb urine dipstick 1+ (*)    Leukocytes, UA 3+ (*)    Bacteria, UA RARE (*)    Squamous Epithelial / LPF 6-30 (*)    All other components within normal limits   ____________________________________________  EKG  No acute stemi ____________________________________________  RADIOLOGY  FINDINGS: No pneumothorax. The cardiomediastinal silhouette is stable. The patient is status post  vertebroplasty of upper lumbar vertebral bodies. Significant anterior wedging of T8 has progressed since the Jul 03, 2014 comparison CT scan. More mild wedging of T11 is similar since the comparison CT scan. No new vertebral body fractures are identified. Right-sided rib fractures are noted, new since Jul 01, 2014. Mild atelectasis in the lateral left lung base. No suspicious infiltrate to suggest pneumonia. No pulmonary nodules or masses.  IMPRESSION: 1. Progression of the T8 compression fracture with increasing wedging since May 2016. 2. No evidence of acute pneumonia. ____________________________________________   PROCEDURES  Procedure(s) performed:  None  Critical Care performed: No  ____________________________________________   INITIAL IMPRESSION / ASSESSMENT AND PLAN / ED COURSE  Pertinent labs & imaging results that were available during my care of the patient were reviewed by me and considered in my medical decision making (see chart for details).  Acute atelectasis. Acute UTI. Rx given for Levaquin 250 mg daily 10 days. Follow up with PCP or return to the ER with any worsening symptomology. In addition patient was given and Robitussin-AC as needed for cough. ____________________________________________   FINAL CLINICAL IMPRESSION(S) / ED DIAGNOSES  Final diagnoses:  Urinary tract infection with hematuria, site unspecified  Atelectasis, right      Arlyss Repress, PA-C 03/15/15 [place], MD 03/15/15 [phone]

## 2015-03-15 NOTE — Patient Outreach (Addendum)
[facility]) Care Management  [id number]  LOUNETTE RAIMONDO [id number]   Attempted to reach patient by telephone prior to scheduled home visit on today, noted in EPIC patient in emergency room. I will continue to follow progress/plan.  Plan Follow up, for rescheduled home visit.  Joylene Draft, RN, [place] Management [phone]- Mobile [phone]- Toll Free Main Office

## 2015-03-16 ENCOUNTER — Other Ambulatory Visit: Payer: Self-pay | Admitting: *Deleted

## 2015-03-16 NOTE — Patient Outreach (Signed)
[facility]) Care Management  [id number]  NIAH SARNOWSKI [id number]  Telephone call to Mrs.Grindel, patient to emergency room on 1/16, with cough, Dx. Urinary tract infection. Mrs.Mark states that her friend will pick up her antibiotics from [facility] on today.  Encouraged patient to make a follow up appointment with [facility], offered my assistance to make appointment patient states that she will do it today. Mrs.Ellwood discussed that she is aware that it is important for her to drink well and eat, reports that she is drinking something now and will eat after that.   Plan Rescheduled home visit for 1/31 at 3 pm Patient will make follow up appointment with PCP Patient will begin her antibiotics on today and continue to take until completed Patient will call PCP for new concerns, questions or unrelieved symptoms  Joylene Draft, RN, [place] Management [phone]- Mobile [phone]- [place]

## 2015-03-18 ENCOUNTER — Ambulatory Visit (INDEPENDENT_AMBULATORY_CARE_PROVIDER_SITE_OTHER): Payer: Commercial Managed Care - HMO | Admitting: Family Medicine

## 2015-03-18 ENCOUNTER — Other Ambulatory Visit: Payer: Self-pay | Admitting: Family Medicine

## 2015-03-18 ENCOUNTER — Encounter: Payer: Self-pay | Admitting: Family Medicine

## 2015-03-18 VITALS — BP 134/85 | HR 99 | Temp 98.0°F | Wt 124.0 lb

## 2015-03-18 DIAGNOSIS — M7061 Trochanteric bursitis, right hip: Secondary | ICD-10-CM | POA: Diagnosis not present

## 2015-03-18 DIAGNOSIS — E46 Unspecified protein-calorie malnutrition: Secondary | ICD-10-CM | POA: Diagnosis not present

## 2015-03-18 DIAGNOSIS — J9811 Atelectasis: Secondary | ICD-10-CM

## 2015-03-18 MED ORDER — MIRTAZAPINE 15 MG PO TABS: 15.0000 mg | ORAL_TABLET | Freq: Every day | ORAL | Status: DC

## 2015-03-18 NOTE — Progress Notes (Signed)
BP 134/85 mmHg  Pulse 99  Temp(Src) 98 F (36.7 C)  Wt 124 lb (56.246 kg)  SpO2 93%  LMP  (LMP Unknown)   Subjective:    Patient ID: Alyssa Ortega, female    DOB: December 21, 1930, 82 y.o.   MRN: [id number]  HPI: Alyssa Ortega is a 82 y.o. female  Chief Complaint  Patient presents with  . ER follow up    Patient went to ther ER on 03-15-15, she was diagnosed with a UTI and collapsed aveoli in her lungs   ER FOLLOW UP Time since discharge: 3 days  Hospital/facility: [facility] Diagnosis: UTI and atelectisis Procedures/tests: CXR- atelecasis, UA Consultants: None New medications: levaquin 250mg  daily x 10 days, robutussin Discharge instructions:  Follow up here Status: better, still not producing much on her urine. Didn't have any bladder symptoms at that time, still feeling tired and wore out. Still not having much of an appetite.   Hasn't had any PT since before christmas. Wants to have her PT back- swelling in her legs, won't wear her compression stockings. Has been keeping her legs up. PT helps to keep her pain down and work on exercise. She is going to have home health come out.   Really not eating. Has been losing weight. Really no appetite. Up and down with sleeping. Would like something to help with this.   Otherwise doing OK. No other concerns or complaints at this time.    Relevant past medical, surgical, family and social history reviewed and updated as indicated. Interim medical history since our last visit reviewed. Allergies and medications reviewed and updated.  Review of Systems  Constitutional: Negative.   HENT: Positive for congestion. Negative for dental problem, drooling, ear discharge, ear pain, facial swelling, hearing loss, mouth sores, nosebleeds, postnasal drip, rhinorrhea, sinus pressure, sneezing, sore throat, tinnitus, trouble swallowing and voice change.   Respiratory: Positive for cough, chest tightness, shortness of breath and wheezing. Negative for apnea,  choking and stridor.   Cardiovascular: Positive for leg swelling. Negative for chest pain and palpitations.  Musculoskeletal: Positive for myalgias, back pain, arthralgias and gait problem. Negative for joint swelling, neck pain and neck stiffness.  Skin: Negative.   Neurological: Negative.   Psychiatric/Behavioral: Negative.     Per HPI unless specifically indicated above     Objective:    BP 134/85 mmHg  Pulse 99  Temp(Src) 98 F (36.7 C)  Wt 124 lb (56.246 kg)  SpO2 93%  LMP  (LMP Unknown)  Wt Readings from Last 3 Encounters:  03/18/15 124 lb (56.246 kg)  03/15/15 130 lb (58.968 kg)  02/19/15 125 lb (56.7 kg)    Physical Exam  Constitutional: She is oriented to person, place, and time. She appears well-developed and well-nourished. No distress.  HENT:  Head: Normocephalic and atraumatic.  Right Ear: Hearing normal.  Left Ear: Hearing normal.  Nose: Nose normal.  Eyes: Conjunctivae and lids are normal. Right eye exhibits no discharge. Left eye exhibits no discharge. No scleral icterus.  Cardiovascular: Normal rate, regular rhythm and intact distal pulses.  Exam reveals no gallop and no friction rub.   Murmur heard. Pulmonary/Chest: Effort normal. No respiratory distress. She has wheezes. She has no rales. She exhibits no tenderness.  Abdominal: Soft. Bowel sounds are normal. She exhibits no distension and no mass. There is no tenderness. There is no rebound and no guarding.  Musculoskeletal: Normal range of motion. She exhibits edema (trace bilaterally).  Neurological: She is alert and oriented  to person, place, and time.  Skin: Skin is warm, dry and intact. No rash noted. No erythema. No pallor.  Psychiatric: She has a normal mood and affect. Her speech is normal and behavior is normal. Judgment and thought content normal. Cognition and memory are normal.  Nursing note and vitals reviewed.   Results for orders placed or performed during the hospital encounter of  [id number]  Basic metabolic panel  Result Value Ref Range   Sodium 131 (L) 135 - 145 mmol/L   Potassium 3.9 3.5 - 5.1 mmol/L   Chloride 96 (L) 101 - 111 mmol/L   CO2 26 22 - 32 mmol/L   Glucose, Bld 125 (H) 65 - 99 mg/dL   BUN 6 6 - 20 mg/dL   Creatinine, Ser 0.44 0.44 - 1.00 mg/dL   Calcium 9.2 8.9 - 10.3 mg/dL   GFR calc non Af Amer >60 >60 mL/min   GFR calc Af Amer >60 >60 mL/min   Anion gap 9 5 - 15  CBC with Differential  Result Value Ref Range   WBC 7.2 3.6 - 11.0 K/uL   RBC 4.62 3.80 - 5.20 MIL/uL   Hemoglobin 14.1 12.0 - 16.0 g/dL   HCT 42.9 35.0 - 47.0 %   MCV 92.9 80.0 - 100.0 fL   MCH 30.6 26.0 - 34.0 pg   MCHC 33.0 32.0 - 36.0 g/dL   RDW 14.4 11.5 - 14.5 %   Platelets 118 (L) 150 - 440 K/uL   Neutrophils Relative % 84 %   Neutro Abs 6.0 1.4 - 6.5 K/uL   Lymphocytes Relative 7 %   Lymphs Abs 0.5 (L) 1.0 - 3.6 K/uL   Monocytes Relative 9 %   Monocytes Absolute 0.6 0.2 - 0.9 K/uL   Eosinophils Relative 0 %   Eosinophils Absolute 0.0 0 - 0.7 K/uL   Basophils Relative 0 %   Basophils Absolute 0.0 0 - 0.1 K/uL  Urinalysis complete, with microscopic  Result Value Ref Range   Color, Urine YELLOW (A) YELLOW   APPearance CLEAR (A) CLEAR   Glucose, UA NEGATIVE NEGATIVE mg/dL   Bilirubin Urine NEGATIVE NEGATIVE   Ketones, ur NEGATIVE NEGATIVE mg/dL   Specific Gravity, Urine 1.018 1.005 - 1.030   Hgb urine dipstick 1+ (A) NEGATIVE   pH 5.0 5.0 - 8.0   Protein, ur NEGATIVE NEGATIVE mg/dL   Nitrite NEGATIVE NEGATIVE   Leukocytes, UA 3+ (A) NEGATIVE   RBC / HPF 6-30 0 - 5 RBC/hpf   WBC, UA TOO NUMEROUS TO COUNT 0 - 5 WBC/hpf   Bacteria, UA RARE (A) NONE SEEN   Squamous Epithelial / LPF 6-30 (A) NONE SEEN   Mucous PRESENT    Hyaline Casts, UA PRESENT       Assessment & Plan:   Problem List Items Addressed This Visit      Musculoskeletal and Integument   Trochanteric bursitis of right hip    Will get her back into PT as it was helping her. Continue to monitor.  She will let us know if not getting better or getting worse.         Other   Malnutrition ([place]) - Primary    Will start her on remeron to try to increase appetite. Recheck in 1 month to see who she is doing on it.        Other Visit Diagnoses    Atelectasis of both lungs        Currently on levaquin. Conitnue antibiotics.  We will recheck lungs in 2 weeks. Continue close follow up.        Follow up plan: Return in about 2 weeks (around 04/01/2015) for lung recheck, 4 weeks for appetite recheck.

## 2015-03-18 NOTE — Assessment & Plan Note (Signed)
Will get her back into PT as it was helping her. Continue to monitor. She will let us know if not getting better or getting worse.

## 2015-03-18 NOTE — Assessment & Plan Note (Signed)
Will start her on remeron to try to increase appetite. Recheck in 1 month to see who she is doing on it.

## 2015-03-22 ENCOUNTER — Telehealth: Payer: Self-pay

## 2015-03-22 NOTE — Telephone Encounter (Signed)
Faxed over on Thursday.

## 2015-03-22 NOTE — Telephone Encounter (Signed)
States she needs orders faxed to [facility] for Nursing and PT

## 2015-03-25 ENCOUNTER — Encounter: Payer: Self-pay | Admitting: *Deleted

## 2015-03-30 ENCOUNTER — Encounter: Payer: Self-pay | Admitting: *Deleted

## 2015-03-30 ENCOUNTER — Other Ambulatory Visit: Payer: Self-pay | Admitting: *Deleted

## 2015-03-30 NOTE — Patient Outreach (Addendum)
[facility]) Care Management   06/02/2701  Alyssa Ortega [birth year] [id number]  Alyssa Ortega is an 82 y.o. female  Subjective: " I am still having some aches and pains in my hip area, but I am still able to get around some'.  Objective:   Review of Systems  Constitutional: Negative.  Negative for fever.  HENT: Negative.   Respiratory: Negative for sputum production.        Occasional cough, dry  Cardiovascular: Negative.  Negative for chest pain and leg swelling.       Bilateral ankles  Gastrointestinal: Negative.        Decreased appetite  Genitourinary: Negative.   Musculoskeletal: Negative for falls.  Skin: Negative.   Neurological: Negative.   Psychiatric/Behavioral: Negative for depression and memory loss.  BP 130/80 mmHg  Pulse 90  Resp 18  SpO2 96%  LMP  (LMP Unknown)  Physical Exam  Constitutional: She is oriented to person, place, and time. She appears well-developed.  Cardiovascular: Normal rate, regular rhythm, normal heart sounds and intact distal pulses.   Respiratory: No respiratory distress. She has no wheezes.  Slightly decreased breath sounds  GI: Soft.  Musculoskeletal: She exhibits no edema.       Right shoulder: She exhibits swelling.       Feet:  Neurological: She is alert and oriented to person, place, and time.  Skin: Skin is warm and dry.  Bilateral feet skin intact, dry skin  Psychiatric: She has a normal mood and affect. Her behavior is normal. Judgment and thought content normal.    Current Medications:   Current Outpatient Prescriptions  Medication Sig Dispense Refill  . amoxicillin-clavulanate (AUGMENTIN) 875-125 MG tablet Take 1 tablet by mouth 2 (two) times daily. 20 tablet 0  . feeding supplement, ENSURE ENLIVE, (ENSURE ENLIVE) LIQD Take 237 mLs by mouth 2 (two) times daily between meals. 237 mL 12  . acetaminophen (TYLENOL) 650 MG CR tablet Take 650-1,300 mg by mouth daily as needed for pain. Reported on  03/30/2015    . guaiFENesin-codeine 100-10 MG/5ML syrup Take 10 mLs by mouth every 4 (four) hours as needed for cough. (Patient not taking: Reported on 03/30/2015) 180 mL 0  . levofloxacin (LEVAQUIN) 250 MG tablet Take 1 tablet (250 mg total) by mouth daily. (Patient not taking: Reported on 03/30/2015) 10 tablet 0  . lidocaine (XYLOCAINE) 5 % ointment Apply 1 application topically as needed. (Patient not taking: Reported on 03/30/2015) 35.44 g 0  . mirtazapine (REMERON) 15 MG tablet Take 1 tablet (15 mg total) by mouth at bedtime. (Patient not taking: Reported on 03/30/2015) 30 tablet 1   Current Facility-Administered Medications  Medication Dose Route Frequency Provider Last Rate Last Dose  . albuterol (PROVENTIL) (2.5 MG/3ML) 0.083% nebulizer solution 2.5 mg  2.5 mg Nebulization Once Valerie Roys, DO        Functional Status:   In your present state of health, do you have any difficulty performing the following activities: 03/30/2015 02/05/2015  Hearing? N N  Vision? Y Y  Difficulty concentrating or making decisions? N Y  Walking or climbing stairs? Y Y  Dressing or bathing? N N  Doing errands, shopping? [name]  Preparing Food and eating ? N N  Using the Toilet? N N  In the past six months, have you accidently leaked urine? N N  Do you have problems with loss of bowel control? N N  Managing your Medications? N N  Managing your Finances?  N Y  Housekeeping or managing your Housekeeping? Aggie Moats    Fall/Depression Screening:    PHQ 2/9 Scores 03/30/2015 02/05/2015 01/28/2015 08/05/2014  PHQ - 2 Score 0 0 0 0   Fall Risk  03/30/2015 03/30/2015 01/28/2015 08/05/2014  Falls in the past year? - No Yes Yes  Number falls in past yr: - - 2 or more 2 or more  Injury with Fall? - - No Yes  Risk Factor Category  - - High Fall Risk High Fall Risk  Risk for fall due to : (No Data) Impaired balance/gait;Impaired mobility History of fall(s);Impaired balance/gait;Impaired mobility Impaired balance/gait  Risk for  fall due to (comments): Fall prevention education - - -  Follow up - - Education provided Education provided   Assessment:   Routine home visit,AlyssaOrtega resting in her recliner where she rest most of the day and sleeps in at night.    Recent ED Visit with Dx: UTI and Atelectasis of lungs: AlyssaOrtega reports decrease in coughing, denies shortness of breath, reports that she has only a dry cough at times, rare cough during visit.  AlyssaFeld denies any problems with urinating, denies burning, pain or change in color. Patient reports that she had been taking her antibiotic, the bottle she shows me is for amoxicillin that was prescribed on 12/23 and she has 3 pills left in bottle, she reports that they are hard to swallow so she breaks them in half, she states that is the only medication that she is taking, noted bottle of Levaquin prescription patient has not taken, she wasn't aware that it was an antibiotic.  High Fall Risk Patient report that she has not had a fall since my last visit, she states physical therapy is working with her again, and she is doing better feeling stronger, but still bothered my discomfort in her hips but that she does not medication for the pain. AlyssaOrtega reports that she has been walking outside with the therapist and doing well. Patient does not use her cane or walker in the home, she only uses her cane when she goes out, patient continues to have a shuffling gait and holds on to items while walking in her home. Discussed with her using her cane to help with balance in her home.  Decreased appetite AlyssaOrtega discussed that she is just not hungry at times, she drinks tea, water daily, and when asked what she had eaten today, she stated only cappuccino this morning.Discussed with patient foods that usually eats at home lately,she mentioned peanut butter and jelly sandwich, Tuna sandwich, egg/toast,oatmeal. Patient does have these food items in refrigerator and  cabinet, including bread, peanut butter,eggs. AlyssaOrtega states that she likes ensure, but is out of it at present, and plans to get more on Friday when she goes shopping. Discussed with patient the new medication started per her PCP, Remeron to help her appetite, she stated that she does not always like taking medication, and has not taking any of medication.  Discussed with AlyssaPesantez the option of meals on wheels she stated that she didn't need that and was not interested in getting assistance with pursuing this, and also attending a senior center for social interaction, patient states she prefers to stay at her home during the day. Educated on the  importance of proper eating and decreases intake can affect the whole body.   AlyssaMclaine has a upcoming appointment with her PCP on Friday, and states her neighbor or either her daughter will be have to provide  transportation, I also mentioned to patient regarding having a back up plan with community resources for transportation, states she prefer to let her friend and family help her for now.  Encouraged patient to call PCP with new symptoms or concerns sooner.  Patient educated on importance of taking all  medications as prescribed, patient will benefit from closer follow up with medication compliance, organization and education on use of each medication.  Plan:  Notify MD office of medications that AlyssaGuin is not taking, Remeron and Levaquin, ask how to advise patient Patient will attend office visit with PCP on 2/3, encouraged to bring her medication to appointment. Patient will begin to drink ensure at least twice daily between meals Review and update patient centered goals Will schedule home visit for Feb.21,or visit sooner after MD visit to assist with medication organization and encouraged patient to call me for concerns sooner.  [facility] CM Care Plan Problem One        Most Recent Value   Care Plan Problem One  High Fall Risk   Role  Documenting the Problem One  Care Management Coordinator   Care Plan for Problem One  Active   THN Long Term Goal (31-90 days)  Patient will report increase knowledge of fall prevention and safety   THN Long Term Goal Start Date  02/05/15   THN Long Term Goal Met Date  03/30/15   [facility] CM Short Term Goal #1 (0-30 days)  Patient will  not experience a fall in the next 30 days   THN CM Short Term Goal #1 Start Date  02/05/15   [facility] CM Short Term Goal #1 Met Date  03/30/15   [facility]N CM Short Term Goal #2 (0-30 days)  Patient will participate in exercises taught by physical therapy at least 5 days a week   [facility] CM Short Term Goal #2 Start Date  03/30/15   Interventions for Short Term Goal #2  Patient demonstrated set of exercises taught to her by physical therapy    [facility] CM Care Plan Problem Two        Most Recent Value   Care Plan Problem Two  Weight loss / Decreased appetite concern   Role Documenting the Problem Two  Care Management [place] for Problem Two  Active   Interventions for Problem Two Long Term Goal   Educated on the importance of a balanced diet,protien and nutrition in role in preventing illness   THN Long Term Goal (31-90) days  Patient will report increased intake in the 30 days   THN Long Term Goal Start Date  03/30/15   [facility] CM Short Term Goal #1 (0-30 days)  Patient will began drinking ensure at least twice daily in the next 30 days   Interventions for Short Term Goal #2   Educated on the nutrtion benefits of ensue, and supplementing meals when not eating well.    [facility] CM Care Plan Problem Three        Most Recent Value   Care Plan Problem Three  Medication Compliance   Role Documenting the Problem Three  Care Management Coordinator   Care Plan for Problem Three  Active   THN Long Term Goal (31-90) days  Patient will demonostrate improved medication compliance in the next 31 days   THN Long Term Goal Start Date  03/30/15   Interventions for Problem Three Long Term  Goal  Educate patient on  the importance of taking medications as prescribed by  PCP and to notify me or PCP if there are problems with taking medications   [facility] CM Short Term Goal #1 (0-30 days)  Patient will take medications as prescribed in the next 30 days   [facility] CM Short Term Goal #1 Start Date  03/30/15   Interventions for Short Term Goal #1  Explore reason why patient does not take medications,discuss use of pill organizer, medication alarm box, Label use of each medication,      Joylene Draft, RN, [place] Management [phone]- Mobile [phone]- [place]

## 2015-03-31 ENCOUNTER — Encounter: Payer: Self-pay | Admitting: *Deleted

## 2015-03-31 NOTE — Patient Outreach (Signed)
[facility] Care Management  [id number]  MADY BREAUX [id number]   Care Coordination note  Telephone call to office of Park Liter, PCP to discuss concerns related to patient and medication concerns found at home visit, related to patient has not starting new prescriptions of Remeron, Levaquin, and that the only medication that Mrs.Goth stated she was taking was Amoxicillin from a previous ED visit prescription.  Patient did have prescriptions filled.  I was able to leave a voice mail message with concerns with my call back number for questions.  Joylene Draft, RN, [place] Management [phone]- Mobile [phone]- Toll Free Main Office

## 2015-04-01 ENCOUNTER — Other Ambulatory Visit: Payer: Self-pay | Admitting: *Deleted

## 2015-04-01 ENCOUNTER — Telehealth: Payer: Self-pay

## 2015-04-01 ENCOUNTER — Ambulatory Visit: Payer: Commercial Managed Care - HMO | Admitting: Family Medicine

## 2015-04-01 NOTE — Patient Outreach (Signed)
[facility]) Care Management  [id number]  PHRONIA ASCH [id number]   Telephone call to Mrs.Skuza to relay a message regarding , patient is to start taking her Levaquin one tablet daily as ordered per message from , [place] at Dr. Park Liter office. Instructed Mrs.Zynda to start taking her Levaquin prescription , she verbalized understanding, able to read the bottle, and repeat back to me the instructions to take medications.  Plan Will continue to follow up with patient closely regarding medication compliance and education.   Joylene Draft, RN, [place] Management [phone]- Mobile [phone]- Toll Free Main Office

## 2015-04-01 NOTE — Patient Outreach (Signed)
[facility]) Care Management  [id number]  Alyssa Ortega [id number]  Late entry from 03/31/2015  Telephone call to office of Elesa Massed, able to leave a message on the nurse line , regarding concerns related to patient  Issue with medication compliance, patient states that she is not taking prescribed Remeron, also patient reports she was still taking amoxicillin, and not Levaquin that was most recently prescribed. Mrs. Blatter did have prescriptions filled. Education provided and will continue , her explanation was she doesn't like taking a lot of medication.  Joylene Draft, RN, [place] Management [phone]- Mobile [phone]- Toll Free Main Office

## 2015-04-01 NOTE — Telephone Encounter (Signed)
THN case manager called, she had a visit with Caroline.  She had the remeron filled, not taking She did not take the levaquin  She is taking Amoxicillin, but still has 3 left. Unsure if she needs to get patient to start levaquin? Spoke with Dr.Johnson, she verbally said that she does want patient to start the levaquin, notified Kim at [facility].

## 2015-04-02 ENCOUNTER — Ambulatory Visit: Payer: Commercial Managed Care - HMO | Admitting: Family Medicine

## 2015-04-15 ENCOUNTER — Ambulatory Visit
Admission: RE | Admit: 2015-04-15 | Discharge: 2015-04-15 | Disposition: A | Discharge status: Home / Self Care | Payer: Commercial Managed Care - HMO | Source: Ambulatory Visit | Attending: Family Medicine | Admitting: Family Medicine

## 2015-04-15 ENCOUNTER — Telehealth: Payer: Self-pay | Admitting: Family Medicine

## 2015-04-15 ENCOUNTER — Ambulatory Visit (INDEPENDENT_AMBULATORY_CARE_PROVIDER_SITE_OTHER): Payer: Medicare HMO | Admitting: Family Medicine

## 2015-04-15 ENCOUNTER — Encounter: Payer: Self-pay | Admitting: Family Medicine

## 2015-04-15 VITALS — BP 139/88 | HR 101 | Temp 99.1°F | Wt 125.0 lb

## 2015-04-15 DIAGNOSIS — R1011 Right upper quadrant pain: Secondary | ICD-10-CM

## 2015-04-15 DIAGNOSIS — J449 Chronic obstructive pulmonary disease, unspecified: Secondary | ICD-10-CM | POA: Insufficient documentation

## 2015-04-15 DIAGNOSIS — R0989 Other specified symptoms and signs involving the circulatory and respiratory systems: Secondary | ICD-10-CM

## 2015-04-15 DIAGNOSIS — J9811 Atelectasis: Secondary | ICD-10-CM | POA: Insufficient documentation

## 2015-04-15 DIAGNOSIS — R634 Abnormal weight loss: Secondary | ICD-10-CM | POA: Diagnosis not present

## 2015-04-15 DIAGNOSIS — H9202 Otalgia, left ear: Secondary | ICD-10-CM

## 2015-04-15 DIAGNOSIS — J45909 Unspecified asthma, uncomplicated: Secondary | ICD-10-CM | POA: Insufficient documentation

## 2015-04-15 MED ORDER — TIOTROPIUM BROMIDE MONOHYDRATE 18 MCG IN CAPS: 18.0000 ug | ORAL_CAPSULE | Freq: Every day | RESPIRATORY_TRACT | Status: DC

## 2015-04-15 NOTE — Assessment & Plan Note (Signed)
Stable on mirtazapine. Continue mirtazapine. Continue to monitor.

## 2015-04-15 NOTE — Telephone Encounter (Signed)
Patient notified

## 2015-04-15 NOTE — Telephone Encounter (Signed)
Please let her know that she doesn't have any pneumonia, but she does need an inhaler to help with her breathing. 2 puffs 1x a day- I sent it to her pharmacy and I'll see her in 2 weeks.

## 2015-04-15 NOTE — Progress Notes (Signed)
BP 139/88 mmHg  Pulse 101  Temp(Src) 99.1 F (37.3 C)  Wt 125 lb (56.7 kg)  SpO2 95%  LMP  (LMP Unknown)   Subjective:    Patient ID: Alyssa Ortega, female    DOB: 1930/07/13, 82 y.o.   MRN: [id number]  HPI: Alyssa Ortega is a 82 y.o. female  Chief Complaint  Patient presents with  . Weight Check  . Two week lung recheck  . Ear Pain    Left  . RUQ Pain   [name] is here today for follow up on her appetite and weight check. She has been on mirtazapine for about 2 weeks now, as she had been forgetting to take it. She has gained 1 lb and has not lost any weight. She is tolerating the medicine well. No concerns or complaints with it. Still not eating very much. Has been drinking her ensure. Doing OK with it.  Did not start levaquin after last appointment. Started it on 2/2 after skilled nursing went out. Here today for recheck on her lungs.   PT back in again. They have been working with her to keep her pain level down and increase exercise tolerance. Home health has not been out yet  ABDOMINAL PAIN  Duration: 2 weeks Onset: sudden Severity: moderate Quality: sharp Location:  RUQ  Episode duration: a minute or so Radiation: no Frequency: 1x a day Alleviating factors:  Time and walking Aggravating factors: nothing Status: stable Treatments attempted: none Fever: no Nausea: no Vomiting: no Weight loss: no Decreased appetite: no Diarrhea: no Constipation: no Blood in stool: no Heartburn: no Jaundice: no Rash: no Dysuria/urinary frequency: no Hematuria: no  EAR PAIN Duration: today Involved ear(s): left Severity:  mild  Quality:  tender Fever: no Otorrhea: no Upper respiratory infection symptoms: no Pruritus: no Hearing loss: no Water immersion no Using Q-tips: yes Recurrent otitis media: no Status: fluctuating Treatments attempted: none  Relevant past medical, surgical, family and social history reviewed and updated as indicated. Interim medical history  since our last visit reviewed. Allergies and medications reviewed and updated.  Review of Systems  Constitutional: Negative.   Respiratory: Positive for chest tightness and shortness of breath. Negative for apnea, cough, choking, wheezing and stridor.   Cardiovascular: Negative.   Gastrointestinal: Positive for nausea, abdominal pain and constipation. Negative for vomiting, diarrhea, blood in stool, abdominal distention, anal bleeding and rectal pain.  Musculoskeletal: Negative.   Skin: Negative.   Psychiatric/Behavioral: Negative.     Per HPI unless specifically indicated above     Objective:    BP 139/88 mmHg  Pulse 101  Temp(Src) 99.1 F (37.3 C)  Wt 125 lb (56.7 kg)  SpO2 95%  LMP  (LMP Unknown)  Wt Readings from Last 3 Encounters:  04/15/15 125 lb (56.7 kg)  03/18/15 124 lb (56.246 kg)  03/15/15 130 lb (58.968 kg)    Physical Exam  Constitutional: She is oriented to person, place, and time. She appears well-developed and well-nourished. No distress.  HENT:  Head: Normocephalic and atraumatic.  Right Ear: Hearing and external ear normal.  Left Ear: Hearing and external ear normal.  Nose: Nose normal.  Mouth/Throat: Oropharynx is clear and moist. No oropharyngeal exudate.  EACs very dry  Eyes: Conjunctivae, EOM and lids are normal. Pupils are equal, round, and reactive to light. Right eye exhibits no discharge. Left eye exhibits no discharge. No scleral icterus.  Neck: Normal range of motion. Neck supple. No JVD present. No tracheal deviation present. No  thyromegaly present.  Cardiovascular: Normal rate, regular rhythm and intact distal pulses.  Exam reveals no gallop and no friction rub.   Murmur heard. Pulmonary/Chest: Effort normal. No stridor. No respiratory distress. She has no decreased breath sounds. She has no wheezes. She has rhonchi in the right lower field. She has no rales.  Abdominal: Soft. Bowel sounds are normal. She exhibits no distension and no mass.  There is tenderness in the right upper quadrant, left upper quadrant and left lower quadrant. There is no rigidity, no rebound, no guarding, no tenderness at McBurney's point and negative Murphy's sign.  Musculoskeletal: Normal range of motion.  Lymphadenopathy:    She has no cervical adenopathy.  Neurological: She is alert and oriented to person, place, and time.  Skin: Skin is warm, dry and intact. No rash noted. She is not diaphoretic. No erythema. No pallor.  Psychiatric: She has a normal mood and affect. Her speech is normal and behavior is normal. Judgment and thought content normal. Cognition and memory are normal.  Nursing note and vitals reviewed.   Results for orders placed or performed during the hospital encounter of [id number]  Basic metabolic panel  Result Value Ref Range   Sodium 131 (L) 135 - 145 mmol/L   Potassium 3.9 3.5 - 5.1 mmol/L   Chloride 96 (L) 101 - 111 mmol/L   CO2 26 22 - 32 mmol/L   Glucose, Bld 125 (H) 65 - 99 mg/dL   BUN 6 6 - 20 mg/dL   Creatinine, Ser 0.44 0.44 - 1.00 mg/dL   Calcium 9.2 8.9 - 10.3 mg/dL   GFR calc non Af Amer >60 >60 mL/min   GFR calc Af Amer >60 >60 mL/min   Anion gap 9 5 - 15  CBC with Differential  Result Value Ref Range   WBC 7.2 3.6 - 11.0 K/uL   RBC 4.62 3.80 - 5.20 MIL/uL   Hemoglobin 14.1 12.0 - 16.0 g/dL   HCT 42.9 35.0 - 47.0 %   MCV 92.9 80.0 - 100.0 fL   MCH 30.6 26.0 - 34.0 pg   MCHC 33.0 32.0 - 36.0 g/dL   RDW 14.4 11.5 - 14.5 %   Platelets 118 (L) 150 - 440 K/uL   Neutrophils Relative % 84 %   Neutro Abs 6.0 1.4 - 6.5 K/uL   Lymphocytes Relative 7 %   Lymphs Abs 0.5 (L) 1.0 - 3.6 K/uL   Monocytes Relative 9 %   Monocytes Absolute 0.6 0.2 - 0.9 K/uL   Eosinophils Relative 0 %   Eosinophils Absolute 0.0 0 - 0.7 K/uL   Basophils Relative 0 %   Basophils Absolute 0.0 0 - 0.1 K/uL  Urinalysis complete, with microscopic  Result Value Ref Range   Color, Urine YELLOW (A) YELLOW   APPearance CLEAR (A) CLEAR    Glucose, UA NEGATIVE NEGATIVE mg/dL   Bilirubin Urine NEGATIVE NEGATIVE   Ketones, ur NEGATIVE NEGATIVE mg/dL   Specific Gravity, Urine 1.018 1.005 - 1.030   Hgb urine dipstick 1+ (A) NEGATIVE   pH 5.0 5.0 - 8.0   Protein, ur NEGATIVE NEGATIVE mg/dL   Nitrite NEGATIVE NEGATIVE   Leukocytes, UA 3+ (A) NEGATIVE   RBC / HPF 6-30 0 - 5 RBC/hpf   WBC, UA TOO NUMEROUS TO COUNT 0 - 5 WBC/hpf   Bacteria, UA RARE (A) NONE SEEN   Squamous Epithelial / LPF 6-30 (A) NONE SEEN   Mucous PRESENT    Hyaline Casts, UA PRESENT  Assessment & Plan:   Problem List Items Addressed This Visit      Other   Weight loss, non-intentional    Stable on mirtazapine. Continue mirtazapine. Continue to monitor.        Other Visit Diagnoses    RUQ pain    -  Primary    Will check labs. Concern for mild pancreatitis. Await results. Continue to monitor closely.    Relevant Orders    CBC with Differential/Platelet    Comprehensive metabolic panel    TSH    UA/M w/rflx Culture, Routine    Amylase    Lipase    Rhonchi        Will check CXR and see if we have to start another antibiotic. Recheck lungs 2 weeks. Await x-ray results.     Relevant Orders    DG Chest 2 View    Ear pain, left        Use mineral oil in the ear. No infection. Continue to monitor.         Follow up plan: Return in about 2 weeks (around 04/29/2015) for Recheck lungs.

## 2015-04-16 ENCOUNTER — Telehealth: Payer: Self-pay | Admitting: Family Medicine

## 2015-04-16 LAB — COMPREHENSIVE METABOLIC PANEL WITH GFR
ALT: 13 [IU]/L (ref 0–32)
AST: 26 [IU]/L (ref 0–40)
Albumin/Globulin Ratio: 1.7 (ref 1.1–2.5)
Albumin: 4.3 g/dL (ref 3.5–4.7)
Alkaline Phosphatase: 127 [IU]/L — ABNORMAL HIGH (ref 39–117)
BUN/Creatinine Ratio: 14 (ref 11–26)
BUN: 7 mg/dL — ABNORMAL LOW (ref 8–27)
Bilirubin Total: 0.5 mg/dL (ref 0.0–1.2)
CO2: 28 mmol/L (ref 18–29)
Calcium: 9.6 mg/dL (ref 8.7–10.3)
Chloride: 91 mmol/L — ABNORMAL LOW (ref 96–106)
Creatinine, Ser: 0.49 mg/dL — ABNORMAL LOW (ref 0.57–1.00)
GFR calc Af Amer: 105 mL/min/{1.73_m2}
GFR calc non Af Amer: 91 mL/min/{1.73_m2}
Globulin, Total: 2.6 g/dL (ref 1.5–4.5)
Glucose: 92 mg/dL (ref 65–99)
Potassium: 4 mmol/L (ref 3.5–5.2)
Sodium: 133 mmol/L — ABNORMAL LOW (ref 134–144)
Total Protein: 6.9 g/dL (ref 6.0–8.5)

## 2015-04-16 LAB — CBC WITH DIFFERENTIAL/PLATELET
Basophils Absolute: 0 10*3/uL (ref 0.0–0.2)
Basos: 0 %
EOS (ABSOLUTE): 0.1 10*3/uL (ref 0.0–0.4)
Eos: 1 %
Hematocrit: 41.7 % (ref 34.0–46.6)
Hemoglobin: 13.7 g/dL (ref 11.1–15.9)
Immature Grans (Abs): 0 10*3/uL (ref 0.0–0.1)
Immature Granulocytes: 0 %
Lymphocytes Absolute: 1.5 10*3/uL (ref 0.7–3.1)
Lymphs: 20 %
MCH: 31 pg (ref 26.6–33.0)
MCHC: 32.9 g/dL (ref 31.5–35.7)
MCV: 94 fL (ref 79–97)
Monocytes Absolute: 0.6 10*3/uL (ref 0.1–0.9)
Monocytes: 8 %
Neutrophils Absolute: 5.4 10*3/uL (ref 1.4–7.0)
Neutrophils: 71 %
Platelets: 135 10*3/uL — ABNORMAL LOW (ref 150–379)
RBC: 4.42 x10E6/uL (ref 3.77–5.28)
RDW: 14.8 % (ref 12.3–15.4)
WBC: 7.6 10*3/uL (ref 3.4–10.8)

## 2015-04-16 LAB — AMYLASE: AMYLASE: 75 U/L (ref 31–124)

## 2015-04-16 LAB — LIPASE: Lipase: 92 U/L — ABNORMAL HIGH (ref 0–59)

## 2015-04-16 NOTE — Telephone Encounter (Signed)
Patient notified

## 2015-04-16 NOTE — Telephone Encounter (Signed)
Please let her know that it looks like her liver and pancreas are slightly angry. Nothing she needs to take medicine for, but probably why her belly was hurting. She should avoid tylenol and alcohol until I see her next time and we'll recheck it then.

## 2015-04-19 ENCOUNTER — Encounter: Payer: Self-pay | Admitting: *Deleted

## 2015-04-19 ENCOUNTER — Other Ambulatory Visit: Payer: Self-pay | Admitting: *Deleted

## 2015-04-19 NOTE — Patient Outreach (Addendum)
[facility]) Care Management   8/76/8115  Alyssa Ortega [birth year] [id number]  Alyssa Ortega is an 82 y.o. female  Subjective:" I got this new inhaler and I am not sure how to use it"    Objective:  BP 130/78 mmHg  Pulse 95  Resp 18  SpO2 97%  LMP  (LMP Unknown) Review of Systems  Constitutional: Negative.   HENT: Negative.   Eyes: Negative.   Respiratory: Positive for cough.        Dry cough  Cardiovascular: Negative.  Negative for chest pain.  Gastrointestinal: Negative.   Genitourinary: Negative.   Musculoskeletal: Positive for back pain. Negative for falls.  Skin: Negative.   Neurological: Negative.   Endo/Heme/Allergies: Negative.   Psychiatric/Behavioral: Negative for depression. The patient is not nervous/anxious.        Denies forgetfulness, states she does some things intentional    Physical Exam  Constitutional: She is oriented to person, place, and time. She appears well-developed and well-nourished.  Cardiovascular: Normal rate, regular rhythm and intact distal pulses.   Respiratory: Effort normal.  Breath sound slightly decreased  GI: Soft.  Musculoskeletal: She exhibits edema.  Bilateral ankle edema  Neurological: She is alert and oriented to person, place, and time.  Skin: Skin is warm and dry.  Psychiatric: She has a normal mood and affect. Her behavior is normal. Judgment and thought content normal.    Current Medications:   Current Outpatient Prescriptions  Medication Sig Dispense Refill  . feeding supplement, ENSURE ENLIVE, (ENSURE ENLIVE) LIQD Take 237 mLs by mouth 2 (two) times daily between meals. 237 mL 12  . acetaminophen (TYLENOL) 650 MG CR tablet Take 650-1,300 mg by mouth daily as needed for pain. Reported on 04/19/2015    . amoxicillin-clavulanate (AUGMENTIN) 875-125 MG tablet Take 1 tablet by mouth 2 (two) times daily. (Patient not taking: Reported on 04/19/2015) 20 tablet 0  . levofloxacin (LEVAQUIN) 250 MG tablet  Take 1 tablet (250 mg total) by mouth daily. (Patient not taking: Reported on 03/30/2015) 10 tablet 0  . mirtazapine (REMERON) 15 MG tablet Take 1 tablet (15 mg total) by mouth at bedtime. (Patient not taking: Reported on 04/19/2015) 30 tablet 1  . tiotropium (SPIRIVA HANDIHALER) 18 MCG inhalation capsule Place 1 capsule (18 mcg total) into inhaler and inhale daily. (Patient not taking: Reported on 04/19/2015) 30 capsule 12   Current Facility-Administered Medications  Medication Dose Route Frequency Provider Last Rate Last Dose  . albuterol (PROVENTIL) (2.5 MG/3ML) 0.083% nebulizer solution 2.5 mg  2.5 mg Nebulization Once Valerie Roys, DO        Functional Status:   In your present state of health, do you have any difficulty performing the following activities: 03/30/2015 02/05/2015  Hearing? N N  Vision? Y Y  Difficulty concentrating or making decisions? N Y  Walking or climbing stairs? Y Y  Dressing or bathing? N N  Doing errands, shopping? [name]  Preparing Food and eating ? N N  Using the Toilet? N N  In the past six months, have you accidently leaked urine? N N  Do you have problems with loss of bowel control? N N  Managing your Medications? N N  Managing your Finances? N Y  Housekeeping or managing your Housekeeping? N Y   Fall Risk  04/19/2015 04/19/2015 04/19/2015 03/30/2015 03/30/2015  Falls in the past year? - - No - No  Number falls in past yr: - - - - -  Injury  with Fall? - - - - -  Risk Factor Category  - High Fall Risk - - -  Risk for fall due to : History of fall(s);Impaired balance/gait;Impaired mobility - History of fall(s);Impaired mobility (No Data) Impaired balance/gait;Impaired mobility  Risk for fall due to (comments): - - - Fall prevention education -  Follow up Falls prevention discussed - Falls evaluation completed;Falls prevention discussed - -    Fall/Depression Screening:    PHQ 2/9 Scores 03/30/2015 02/05/2015 01/28/2015 08/05/2014  PHQ - 2 Score 0 0 0 0     Assessment:  Routine home visit, patient resting in her recliner where she also sleeps at night. Patient has a clear walking  pathway to her kitchen in which she has a stepdown to get into kitcen.  Falls Patient continues to hold on to furniture as she walks through her home, declines to use walker or cane in the home. Patient states that she always takes her cane when she goes outside the home. No recent falls. Patient is still followed by home health therapy, but states he is on vacation this week.  Medication Adherence Patient states she has only taken Mirtazapine one time, states he just doesn't like to take medication, educated patient on the purpose of the medication . Patient states he did start to take her antibiotic Levaquin , bottle observed with 4 remaining pills, educated patient on the importance of taking medication as prescribed and completing all doses. Patient has new prescription for Spriva, expressed that she did not know how to use, will educate.  Nutrition  Patient reports that she has gained 1 pound since last office visit. Patient reports that she drinks ensure at least daily, only has one left for today, states she will get her neighbor to purchase more for her. Patient able to list foods that she like to eat, reviewed protein foods in her refrigerator that she should choose from. Educated on importance of including protein in her diet. Patient reports she had peanut butter cracker and diet coke this morning, and states she thinks she eats enough. Educated patient on benefit of small frequent meals.    Plan:  Will follow up with patient by Friday to make sure she has completed antiboitic and taking spriva. Patient will increase nutrition intact Patient will take all medication as prescribed, and demonstrate proper use of spriva Will schedule next visit on 05/05/14.   THN CM Care Plan Problem One        Most Recent Value   Care Plan Problem One  High Fall Risk    Role Documenting the Problem One  Care Management Coordinator   Care Plan for Problem One  Active   THN Long Term Goal (31-90 days)  Patient will report increase knowledge of fall prevention and safety   THN Long Term Goal Start Date  02/05/15   THN Long Term Goal Met Date  03/30/15   THN CM Short Term Goal #1 (0-30 days)  Patient will  not experience a fall in the next 30 days   THN CM Short Term Goal #1 Start Date  02/05/15   [facility] CM Short Term Goal #1 Met Date  03/30/15   THN CM Short Term Goal #2 (0-30 days)  Patient will participate in exercises taught by physical therapy at least 5 days a week   THN CM Short Term Goal #2 Start Date  03/30/15   [facility] CM Short Term Goal #2 Met Date  04/19/15    [facility]  CM Care Plan Problem Two        Most Recent Value   Care Plan Problem Two  Weight loss / Decreased appetite concern   Role Documenting the Problem Two  Care Management Coordinator   Care Plan for Problem Two  Active   Interventions for Problem Two Long Term Goal   Reinforced on the importance of a balanced diet,protien and nutrition in role in preventing illness   THN Long Term Goal (31-90) days  Patient will report increased intake in the 30 days   THN Long Term Goal Start Date  03/30/15 [goal date restarted]   THN CM Short Term Goal #1 (0-30 days)  Patient will began drinking ensure at least twice daily in the next 30 days   THN CM Short Term Goal #1 Start Date  04/19/15 [goal date restarted]   Interventions for Short Term Goal #2    Reinforced benefits of ensue, and supplementing meals when not eating well, provide ensure coupon on visit in 2 weeks.    [facility] CM Care Plan Problem Three        Most Recent Value   Care Plan Problem Three  Medication Compliance   Role Documenting the Problem Three  Care Management Coordinator   Care Plan for Problem Three  Active   THN Long Term Goal (31-90) days  Patient will demonostrate improved medication compliance in the next 31 days   THN Long Term Goal  Start Date  03/30/15   Interventions for Problem Three Long Term Goal  Reinforced with patient the importance of taking all medication as prescribed,, reeducated on the purpose of each medication    THN CM Short Term Goal #1 (0-30 days)  Patient will take medications as prescribed in the next 30 days   THN CM Short Term Goal #1 Start Date  03/30/15   Interventions for Short Term Goal #1  Labeled top of medication bottle with time of day to take  medication , reviewed purpoose of each medication with teachback.   THN CM Short Term Goal #2 (0-30 days)  Patient will demonstrate proper use of spriva inhaler in the next 2 weeks.   THN CM Short Term Goal #2 Start Date  04/19/15   Interventions for Short Term Goal #2  Educate proper use of spriva by video,demonstate and teachback     Joylene Draft, RN, [place] Management [phone]- Mobile [phone]- [place] Office

## 2015-04-20 ENCOUNTER — Ambulatory Visit: Payer: Self-pay | Admitting: *Deleted

## 2015-04-22 ENCOUNTER — Other Ambulatory Visit: Payer: Self-pay | Admitting: *Deleted

## 2015-04-22 NOTE — Patient Outreach (Signed)
[place] [facility]) Care Management  [id number]  THAI GEBRE [id number]   Follow telephone assessment call to patient to discuss her medication compliance, Mrs.Staver reports that she his been able to use her spriv inhaler, without problems, she also reports taking per mirtazapine,that "tiny pill at night" as he refers to.   Mrs. Granados reports her appetite has been good on today, she discussed the food she has eaten, she is out of ensure now and states her daughter will get her some on tomorrow.  Plan RNCM will visit patient in home on 3/8 for closer follow up or sooner if needed.  Joylene Draft, RN, [place] Management [phone]- Mobile [phone]- Toll Free Main Office

## 2015-04-29 ENCOUNTER — Ambulatory Visit: Payer: Commercial Managed Care - HMO | Admitting: Family Medicine

## 2015-04-30 ENCOUNTER — Ambulatory Visit: Payer: Commercial Managed Care - HMO | Admitting: Family Medicine

## 2015-05-05 ENCOUNTER — Telehealth: Payer: Self-pay | Admitting: Family Medicine

## 2015-05-05 ENCOUNTER — Encounter: Payer: Self-pay | Admitting: *Deleted

## 2015-05-05 ENCOUNTER — Other Ambulatory Visit: Payer: Self-pay | Admitting: *Deleted

## 2015-05-05 NOTE — Telephone Encounter (Signed)
Kim from [facility] called stated she wanted to call and give observations from today's visit:  Nurse noticed that she had more swelling in feet and ankles, no shortness of breath or anything, pt is not consistently taking her Remeron, they are working towards getting her a scale she can lay on at home. Thanks.

## 2015-05-05 NOTE — Patient Outreach (Addendum)
[place] [facility]) Care Management   10/30/8180  Alyssa Ortega [birth year] [id number]  Alyssa Ortega is an 82 y.o. female  Subjective: Patient complains of right hip discomfort on today, states she only takes one tylenol at night.  Objective:  BP 130/78 mmHg  Pulse 82  Resp 18  SpO2 97%  LMP  (LMP Unknown) Review of Systems  Constitutional: Negative.   HENT: Negative.   Eyes: Negative.   Respiratory: Positive for cough. Negative for wheezing.   Cardiovascular: Positive for leg swelling.  Gastrointestinal: Negative for constipation.  Genitourinary: Negative.   Musculoskeletal: Positive for joint pain. Negative for falls.       Right hip discomfort at times  Skin: Negative.   Neurological: Negative.   Psychiatric/Behavioral: Negative.     Physical Exam  Constitutional: She is oriented to person, place, and time. She appears well-developed and well-nourished.  Cardiovascular: Normal rate, regular rhythm and normal heart sounds.   Pulses:      Dorsalis pedis pulses are 1+ on the right side, and 1+ on the left side.  Respiratory: Effort normal and breath sounds normal.  GI: Soft.  Neurological: She is alert and oriented to person, place, and time.  Skin: Skin is warm and dry.     Psychiatric: She has a normal mood and affect. Her behavior is normal. Judgment and thought content normal.    Current Medications:   Current Outpatient Prescriptions  Medication Sig Dispense Refill  . acetaminophen (TYLENOL) 650 MG CR tablet Take 650-1,300 mg by mouth daily as needed for pain. Reported on 04/19/2015    . feeding supplement, ENSURE ENLIVE, (ENSURE ENLIVE) LIQD Take 237 mLs by mouth 2 (two) times daily between meals. 237 mL 12  . mirtazapine (REMERON) 15 MG tablet Take 1 tablet (15 mg total) by mouth at bedtime. 30 tablet 1  . tiotropium (SPIRIVA HANDIHALER) 18 MCG inhalation capsule Place 1 capsule (18 mcg total) into inhaler and inhale daily. 30 capsule 12  .  amoxicillin-clavulanate (AUGMENTIN) 875-125 MG tablet Take 1 tablet by mouth 2 (two) times daily. (Patient not taking: Reported on 04/19/2015) 20 tablet 0  . levofloxacin (LEVAQUIN) 250 MG tablet Take 1 tablet (250 mg total) by mouth daily. (Patient not taking: Reported on 03/30/2015) 10 tablet 0   Current Facility-Administered Medications  Medication Dose Route Frequency Provider Last Rate Last Dose  . albuterol (PROVENTIL) (2.5 MG/3ML) 0.083% nebulizer solution 2.5 mg  2.5 mg Nebulization Once Valerie Roys, DO        Functional Status:   In your present state of health, do you have any difficulty performing the following activities: 03/30/2015 02/05/2015  Hearing? N N  Vision? Y Y  Difficulty concentrating or making decisions? N Y  Walking or climbing stairs? Y Y  Dressing or bathing? N N  Doing errands, shopping? [name]  Preparing Food and eating ? N N  Using the Toilet? N N  In the past six months, have you accidently leaked urine? N N  Do you have problems with loss of bowel control? N N  Managing your Medications? N N  Managing your Finances? N Y  Housekeeping or managing your Housekeeping? N Y   Fall Risk  04/19/2015 04/19/2015 04/19/2015 03/30/2015 03/30/2015  Falls in the past year? - - No - No  Number falls in past yr: - - - - -  Injury with Fall? - - - - -  Risk Factor Category  - High Fall Risk - - -  Risk for fall due to : History of fall(s);Impaired balance/gait;Impaired mobility - History of fall(s);Impaired mobility (No Data) Impaired balance/gait;Impaired mobility  Risk for fall due to (comments): - - - Fall prevention education -  Follow up Falls prevention discussed - Falls evaluation completed;Falls prevention discussed - -    Fall/Depression Screening:    PHQ 2/9 Scores 03/30/2015 02/05/2015 01/28/2015 08/05/2014  PHQ - 2 Score 0 0 0 0    Assessment:  Patient sitting on front porch on arrival at visit.   Falls/Mobility Patient denies falls, gait shuffling, patient  reports using cane when outside takes cane when going out of home, patient holds onto furniture as moving through her home. Patient reports physical therapy is still active with her , she reports walking outside with physical  therapist on Monday and next appointment is Friday 3/10. Alyssa Ortega reports gait limited at times related to hip pain and that she only takes one tylenol at night states it helps sometimes.  Medication Adherence Patient reports that she has been using her Spiriva daily, patient demonstrated proper administration during visit minimal cues. Alyssa Ortega has not been consistent with taking her mirtazapine  on a daily basis, states " I don't think a need it", again educated on the use and benefits of medications. Patient denies having difficulty remembering, states I have a good memory.  Swelling bilateral ankles Noted swelling at bilateral ankle, pedal area with left being greater than left. Patient reports that she keeps her left leg elevated at times during the night and at times during the day. Discussed with patient to limit salt in her diet, discussed high  food to limit.  Nutrition concerns  Alyssa Ortega does not have working scales at home and states unable to afford, with assist patient scales and encouraged to weigh on a regular basis to track weight loss/gain. Alyssa Ortega talks freely about foods that she likes, observed various food items in her cabinet and refrigerator including boost, vegetables,protein, encouraged importance of eating a balanced diet. Patient states she only drank one boost on yesterday, and at visit today patient had not eaten breakfast , but will drink a boost for lunch.   Patient has an upcoming appointment with her PCP on 3/13, states her friend Alyssa Ortega will provide transportation . Contacted Alyssa Ortega , PCP office to communicate concerns related to increased edema and inconsistent with taking mirtazapine .    Plan:  Patient will  attend PCP appointment on 3/13 Patient will report increasing food or nutrition intake RNCM through [facility] will assist patient with getting scales to weigh on.  Will schedule home visit on 3/17 to follow up on patient.   THN CM Care Plan Problem One        Most Recent Value   Care Plan Problem One  High Fall Risk   Role Documenting the Problem One  Care Management Coordinator   Care Plan for Problem One  Active   THN Long Term Goal (31-90 days)  Patient will report increase knowledge of fall prevention and safety   THN Long Term Goal Start Date  02/05/15   THN Long Term Goal Met Date  03/30/15   THN CM Short Term Goal #1 (0-30 days)  Patient will  not experience a fall in the next 30 days   THN CM Short Term Goal #1 Start Date  02/05/15   [facility] CM Short Term Goal #1 Met Date  03/30/15   THN CM Short Term Goal #2 (0-30 days)  Patient will  participate in exercises taught by physical therapy at least 5 days a week   THN CM Short Term Goal #2 Start Date  03/30/15   [facility] CM Short Term Goal #2 Met Date  04/19/15    [facility] CM Care Plan Problem Two        Most Recent Value   Care Plan Problem Two  Weight loss / Decreased appetite concern   Role Documenting the Problem Two  Care Management Coordinator   Care Plan for Problem Two  Active   Interventions for Problem Two Long Term Goal   Reviewed foods in patient cabinet and refrigerator,and discussed nutrition benefits of proteins,grains    THN Long Term Goal (31-90) days  Patient will report increased intake in the 30 days   THN Long Term Goal Start Date  03/30/15 [goal date restarted]   THN CM Short Term Goal #1 (0-30 days)  Patient will began drinking ensure at least twice daily in the next 30 days   THN CM Short Term Goal #1 Start Date  04/19/15 [goal date restarted]   Interventions for Short Term Goal #2    Reinforced benefits of ensure  and supplementing meals when not eating well, provide ensure coupon on visit in 2 weeks.   THN CM Short Term Goal  #2 (0-30 days)  Patient will have scales to weigh on and begin to weigh daily and record in the next 14 days    THN CM Short Term Goal #2 Start Date  05/05/15   Interventions for Short Term Goal #2  Will assist with providing scales through [facility] for patient to weigh daily, explained  to paitient importance of tracking weights       [facility] CM Care Plan Problem Three        Most Recent Value   Care Plan Problem Three  Medication Compliance   Role Documenting the Problem Three  Care Management Coordinator   Care Plan for Problem Three  Active   THN Long Term Goal (31-90) days  Patient will demonostrate improved medication compliance in the next 31 days   THN Long Term Goal Start Date  03/30/15   Interventions for Problem Three Long Term Goal  Reviewed  with patient the importance of taking all medication as prescribed,, reeducated on the purpose of each medication    THN CM Short Term Goal #1 (0-30 days)  Patient will take medications as prescribed in the next 30 days   THN CM Short Term Goal #1 Start Date  03/30/15   Interventions for Short Term Goal #1  Placed medication on chairside table where patient sleeps at night for easy access   THN CM Short Term Goal #2 (0-30 days)  Patient will demonstrate proper use of spriva inhaler in the next 2 weeks.   THN CM Short Term Goal #2 Start Date  04/19/15   [facility] CM Short Term Goal #2 Met Date  05/05/15     Joylene Draft, RN, [place] [facility] Management [phone]- Mobile [phone]- Toll Free Main Office

## 2015-05-06 NOTE — Telephone Encounter (Signed)
Forward to provider

## 2015-05-06 NOTE — Telephone Encounter (Signed)
She is scheduled for 05/10/15.

## 2015-05-06 NOTE — Telephone Encounter (Signed)
She was supposed to have a follow up with me and it got cancelled, Does she have another appointment and if not, can we please get her scheduled?

## 2015-05-10 ENCOUNTER — Ambulatory Visit: Payer: Commercial Managed Care - HMO | Admitting: Family Medicine

## 2015-05-11 ENCOUNTER — Ambulatory Visit (INDEPENDENT_AMBULATORY_CARE_PROVIDER_SITE_OTHER): Payer: Commercial Managed Care - HMO | Admitting: Family Medicine

## 2015-05-11 ENCOUNTER — Encounter: Payer: Self-pay | Admitting: Family Medicine

## 2015-05-11 VITALS — BP 141/82 | HR 109 | Temp 98.9°F | Wt 131.0 lb

## 2015-05-11 DIAGNOSIS — M4726 Other spondylosis with radiculopathy, lumbar region: Secondary | ICD-10-CM

## 2015-05-11 DIAGNOSIS — R609 Edema, unspecified: Secondary | ICD-10-CM

## 2015-05-11 DIAGNOSIS — F1010 Alcohol abuse, uncomplicated: Secondary | ICD-10-CM

## 2015-05-11 NOTE — Patient Instructions (Addendum)
Take your tylenol 3x a day when you wake up, after lunch and before bedtime. If you aren't feeling better by next visit, we will try something else for your pain.   I'm glad your belly is feeling better.   We are getting you an appointment to have an ultrasound of your leg. For now, keep it elevated and wear your compression stockings- it will make your legs feel better.   Peripheral Edema You have swelling in your legs (peripheral edema). This swelling is due to excess accumulation of salt and water in your body. Edema may be a sign of heart, kidney or liver disease, or a side effect of a medication. It may also be due to problems in the leg veins. Elevating your legs and using special support stockings may be very helpful, if the cause of the swelling is due to poor venous circulation. Avoid long periods of standing, whatever the cause. Treatment of edema depends on identifying the cause. Chips, pretzels, pickles and other salty foods should be avoided. Restricting salt in your diet is almost always needed. Water pills (diuretics) are often used to remove the excess salt and water from your body via urine. These medicines prevent the kidney from reabsorbing sodium. This increases urine flow. Diuretic treatment may also result in lowering of potassium levels in your body. Potassium supplements may be needed if you have to use diuretics daily. Daily weights can help you keep track of your progress in clearing your edema. You should call your caregiver for follow up care as recommended. SEEK IMMEDIATE MEDICAL CARE IF:   You have increased swelling, pain, redness, or heat in your legs.  You develop shortness of breath, especially when lying down.  You develop chest or abdominal pain, weakness, or fainting.  You have a fever.   This information is not intended to replace advice given to you by your health care provider. Make sure you discuss any questions you have with your health care provider.    Document Released: 03/23/2004 Document Revised: 05/08/2011 Document Reviewed: 08/26/2014 Elsevier Interactive Patient Education [employer].

## 2015-05-11 NOTE — Progress Notes (Signed)
BP 141/82 mmHg  Pulse 109  Temp(Src) 98.9 F (37.2 C)  Wt 131 lb (59.421 kg)  SpO2 97%  LMP  (LMP Unknown)   Subjective:    Patient ID: Alyssa Ortega, female    DOB: 1930/07/30, 82 y.o.   MRN: [id number]  HPI: Alyssa Ortega is a 82 y.o. female  Chief Complaint  Patient presents with  . Back Pain    patient states that she needs something stronger for the pain, tylenol is not working any longer  . Edema    bilateral, left is worse than right   Pain in her belly is all gone. Feeling better than she had been.   Has been walking with her PT. Doing well with Gerald Stabs, his PT. States that her pain has been worse. Has been working with PT, but only taking 1 tylenol a day. Feels achey in her low back and hips down her L leg. No numbness or tingling. No falls recently. Has otherwise been doing OK.   Has been having home health come in and help. Doing well with them.   L ankle has been swollen for a while. Has been keeping her legs up at night. Not been walking around much. Will not wear compression stockings. States that her L leg has been very sore and more swollen. Has been very tender and feels weak when she is standing on it. She states that she has otherwise been doing well with no other concerns or complaints at this time.    Relevant past medical, surgical, family and social history reviewed and updated as indicated. Interim medical history since our last visit reviewed. Allergies and medications reviewed and updated.  Review of Systems  Constitutional: Negative.   Respiratory: Negative.   Cardiovascular: Positive for leg swelling. Negative for chest pain and palpitations.  Musculoskeletal: Negative.   Psychiatric/Behavioral: Negative.    Per HPI unless specifically indicated above     Objective:    BP 141/82 mmHg  Pulse 109  Temp(Src) 98.9 F (37.2 C)  Wt 131 lb (59.421 kg)  SpO2 97%  LMP  (LMP Unknown)  Wt Readings from Last 3 Encounters:  05/11/15 131 lb (59.421  kg)  04/15/15 125 lb (56.7 kg)  03/18/15 124 lb (56.246 kg)    Physical Exam  Constitutional: She is oriented to person, place, and time. She appears well-developed and well-nourished. No distress.  HENT:  Head: Normocephalic and atraumatic.  Right Ear: Hearing normal.  Left Ear: Hearing normal.  Nose: Nose normal.  Eyes: Conjunctivae and lids are normal. Right eye exhibits no discharge. Left eye exhibits no discharge. No scleral icterus.  Cardiovascular: Normal rate, regular rhythm, normal heart sounds and intact distal pulses.  Exam reveals no gallop and no friction rub.   No murmur heard. Pulmonary/Chest: Effort normal and breath sounds normal. No respiratory distress. She has no wheezes. She has no rales. She exhibits no tenderness.  Musculoskeletal: She exhibits edema (2+ edema L ankle, visable and tender vericose vein at the back on L calf, Negative Homan's + squeeze) and tenderness.  Decreased ROM with antalgic gait.   Neurological: She is alert and oriented to person, place, and time.  Skin: Skin is warm, dry and intact. No rash noted. No erythema. No pallor.  Psychiatric: She has a normal mood and affect. Her speech is normal and behavior is normal. Judgment and thought content normal. Cognition and memory are normal.  Nursing note and vitals reviewed.   Results for orders placed  or performed in visit on 04/15/15  CBC with Differential/Platelet  Result Value Ref Range   WBC 7.6 3.4 - 10.8 x10E3/uL   RBC 4.42 3.77 - 5.28 x10E6/uL   Hemoglobin 13.7 11.1 - 15.9 g/dL   Hematocrit 41.7 34.0 - 46.6 %   MCV 94 79 - 97 fL   MCH 31.0 26.6 - 33.0 pg   MCHC 32.9 31.5 - 35.7 g/dL   RDW 14.8 12.3 - 15.4 %   Platelets 135 (L) 150 - 379 x10E3/uL   Neutrophils 71 %   Lymphs 20 %   Monocytes 8 %   Eos 1 %   Basos 0 %   Neutrophils Absolute 5.4 1.4 - 7.0 x10E3/uL   Lymphocytes Absolute 1.5 0.7 - 3.1 x10E3/uL   Monocytes Absolute 0.6 0.1 - 0.9 x10E3/uL   EOS (ABSOLUTE) 0.1 0.0 - 0.4  x10E3/uL   Basophils Absolute 0.0 0.0 - 0.2 x10E3/uL   Immature Granulocytes 0 %   Immature Grans (Abs) 0.0 0.0 - 0.1 x10E3/uL  Comprehensive metabolic panel  Result Value Ref Range   Glucose 92 65 - 99 mg/dL   BUN 7 (L) 8 - 27 mg/dL   Creatinine, Ser 0.49 (L) 0.57 - 1.00 mg/dL   GFR calc non Af Amer 91 >59 mL/min/1.73   GFR calc Af Amer 105 >59 mL/min/1.73   BUN/Creatinine Ratio 14 11 - 26   Sodium 133 (L) 134 - 144 mmol/L   Potassium 4.0 3.5 - 5.2 mmol/L   Chloride 91 (L) 96 - 106 mmol/L   CO2 28 18 - 29 mmol/L   Calcium 9.6 8.7 - 10.3 mg/dL   Total Protein 6.9 6.0 - 8.5 g/dL   Albumin 4.3 3.5 - 4.7 g/dL   Globulin, Total 2.6 1.5 - 4.5 g/dL   Albumin/Globulin Ratio 1.7 1.1 - 2.5   Bilirubin Total 0.5 0.0 - 1.2 mg/dL   Alkaline Phosphatase 127 (H) 39 - 117 IU/L   AST 26 0 - 40 IU/L   ALT 13 0 - 32 IU/L  Amylase  Result Value Ref Range   Amylase 75 31 - 124 U/L  Lipase  Result Value Ref Range   Lipase 92 (H) 0 - 59 U/L      Assessment & Plan:   Problem List Items Addressed This Visit      Musculoskeletal and Integument   Degenerative arthritis of lumbar spine - Primary    Will work on taking tylenol TID. Continue PT. Continue to monitor. Recheck 1 month to see how she's doing.         Other   Alcohol abuse, episodic    Rechecking levels today. Await results. Belly pain has resolved.       Relevant Orders   Comprehensive metabolic panel   Amylase   Lipase    Other Visit Diagnoses    Peripheral edema        Mild concern for DVT, more likely dependent swelling worse due to vericose veins. Will check [place]- ordered today. Patient recommended to wear compression stocking    Relevant Orders    Ambulatory referral to Vascular Surgery        Follow up plan: Return in about 4 weeks (around 06/08/2015).

## 2015-05-11 NOTE — Assessment & Plan Note (Signed)
Rechecking levels today. Await results. Belly pain has resolved.

## 2015-05-11 NOTE — Assessment & Plan Note (Signed)
Will work on taking tylenol TID. Continue PT. Continue to monitor. Recheck 1 month to see how she's doing.

## 2015-05-12 ENCOUNTER — Telehealth: Payer: Self-pay | Admitting: Family Medicine

## 2015-05-12 LAB — COMPREHENSIVE METABOLIC PANEL
ALBUMIN: 3.8 g/dL (ref 3.5–4.7)
ALK PHOS: 150 IU/L — AB (ref 39–117)
ALT: 10 IU/L (ref 0–32)
AST: 23 IU/L (ref 0–40)
Albumin/Globulin Ratio: 1.4 (ref 1.2–2.2)
BUN / CREAT RATIO: 13 (ref 11–26)
BUN: 6 mg/dL — AB (ref 8–27)
Bilirubin Total: 0.5 mg/dL (ref 0.0–1.2)
CALCIUM: 9.3 mg/dL (ref 8.7–10.3)
CO2: 23 mmol/L (ref 18–29)
CREATININE: 0.48 mg/dL — AB (ref 0.57–1.00)
Chloride: 94 mmol/L — ABNORMAL LOW (ref 96–106)
GFR, EST AFRICAN AMERICAN: 106 mL/min/{1.73_m2} (ref >59)
GFR, EST NON AFRICAN AMERICAN: 92 mL/min/{1.73_m2} (ref >59)
GLOBULIN, TOTAL: 2.7 g/dL (ref 1.5–4.5)
Glucose: 104 mg/dL — ABNORMAL HIGH (ref 65–99)
Potassium: 4 mmol/L (ref 3.5–5.2)
SODIUM: 135 mmol/L (ref 134–144)
TOTAL PROTEIN: 6.5 g/dL (ref 6.0–8.5)

## 2015-05-12 LAB — AMYLASE: Amylase: 71 U/L (ref 31–124)

## 2015-05-12 LAB — LIPASE: Lipase: 68 U/L — ABNORMAL HIGH (ref 0–59)

## 2015-05-12 NOTE — Telephone Encounter (Signed)
Please let her know that her labs came back better. Thanks!

## 2015-05-12 NOTE — Telephone Encounter (Signed)
Patient notified

## 2015-05-14 ENCOUNTER — Other Ambulatory Visit: Payer: Self-pay | Admitting: *Deleted

## 2015-05-14 ENCOUNTER — Ambulatory Visit: Payer: Self-pay | Admitting: *Deleted

## 2015-05-14 NOTE — Patient Outreach (Signed)
[facility] [facility]) Care Management  [place]  9/93/7169   Alyssa Ortega [birth year] [id number]  Subjective: "I am doing just fine, already had my walk today".   Current Medications:  Current Outpatient Prescriptions  Medication Sig Dispense Refill  . acetaminophen (TYLENOL) 650 MG CR tablet Take 650-1,300 mg by mouth daily as needed for pain. Reported on 04/19/2015    . feeding supplement, ENSURE ENLIVE, (ENSURE ENLIVE) LIQD Take 237 mLs by mouth 2 (two) times daily between meals. 237 mL 12  . tiotropium (SPIRIVA HANDIHALER) 18 MCG inhalation capsule Place 1 capsule (18 mcg total) into inhaler and inhale daily. 30 capsule 12   Current Facility-Administered Medications  Medication Dose Route Frequency Provider Last Rate Last Dose  . albuterol (PROVENTIL) (2.5 MG/3ML) 0.083% nebulizer solution 2.5 mg  2.5 mg Nebulization Once Valerie Roys, DO        Functional Status:  In your present state of health, do you have any difficulty performing the following activities: 03/30/2015 02/05/2015  Hearing? N N  Vision? Y Y  Difficulty concentrating or making decisions? N Y  Walking or climbing stairs? Y Y  Dressing or bathing? N N  Doing errands, shopping? [name]  Preparing Food and eating ? N N  Using the Toilet? N N  In the past six months, have you accidently leaked urine? N N  Do you have problems with loss of bowel control? N N  Managing your Medications? N N  Managing your Finances? N Y  Housekeeping or managing your Housekeeping? N Y    Fall/Depression Screening: PHQ 2/9 Scores 03/30/2015 02/05/2015 01/28/2015 08/05/2014  PHQ - 2 Score 0 0 0 0    Assessment:  Call to patient prior to original plan for home visit to follow up on swelling in lower legs and weights depending on whether patient had received scales. Patient reports that she has not received scales yet. Alyssa Ortega discussed recent visit to PCP and other test being ordered to follow up on the swelling  she has in her legs.  Fall Risk: Denies fall, physical therapy has visited this am and patient reports she has already had her walk outside.  Patient denies increased pain this am, encouraged to take medication as prescribed and to notify MD of new or worsen symptoms.  Plan:  I will follow up on status of scales being delivered, and record call to patient within 7 days 3/23, and next home visit scheduled for 05/31/15. Alyssa Draft, RN, [place] Care Management [phone]- Mobile [phone]- Toll Free Main Office [facility] CM Care Plan Problem One        Most Recent Value   Care Plan Problem One  High Fall Risk   Role Documenting the Problem One  Care Management Coordinator   Care Plan for Problem One  Active   THN Long Term Goal (31-90 days)  Patient will report increase knowledge of fall prevention and safety   THN Long Term Goal Start Date  02/05/15   THN Long Term Goal Met Date  03/30/15   THN CM Short Term Goal #1 (0-30 days)  Patient will  not experience a fall in the next 30 days   THN CM Short Term Goal #1 Start Date  02/05/15   [facility] CM Short Term Goal #1 Met Date  03/30/15   THN CM Short Term Goal #2 (0-30 days)  Patient will participate in exercises taught by physical therapy at least 5 days a week  THN CM Short Term Goal #2 Start Date  03/30/15   [facility] CM Short Term Goal #2 Met Date  04/19/15    [facility] CM Care Plan Problem Two        Most Recent Value   Care Plan Problem Two  Weight loss / Decreased appetite concern   Role Documenting the Problem Two  Care Management Coordinator   Care Plan for Problem Two  Active   Interventions for Problem Two Long Term Goal   Reeducated on benefits of protein, and balanced diet  [goal date restarted.]   THN Long Term Goal (31-90) days  Patient will report increased intake in the 31 days   THN Long Term Goal Start Date  05/14/15 [goal date restarted]   THN CM Short Term Goal #1 (0-30 days)  Patient will began drinking ensure at least twice  daily in the next 30 days   THN CM Short Term Goal #1 Start Date  05/14/15 [goal date restarted]   Interventions for Short Term Goal #2    Encouraged to drink ensure at least twice daily, to increase nutrition, to supplement food she eats, educated on how balanced nutrition can help with preventing infection [goal date restarted]   THN CM Short Term Goal #2 (0-30 days)  Patient will have scales to weigh on and begin to weigh daily and record in the next 14 days    THN CM Short Term Goal #2 Start Date  05/05/15   Interventions for Short Term Goal #2  RNCM will follow up on timing of delivery of scales and communicate to patient    [facility] CM Care Plan Problem Three        Most Recent Value   Care Plan Problem Three  Medication Compliance   Role Documenting the Problem Three  Care Management Coordinator   Care Plan for Problem Three  Active   THN Long Term Goal (31-90) days  Patient will demonostrate improved medication compliance in the next 31 days   THN Long Term Goal Start Date  05/14/15 [goal date restart]   Interventions for Problem Three Long Term Goal  Reinforced with patient the importance of taking all medication as prescribed,, reeducated on the purpose of each medication    THN CM Short Term Goal #1 (0-30 days)  Patient will take medications as prescribed in the next 30 days   THN CM Short Term Goal #1 Start Date  05/14/15 Barrie Folk dater restarted]   Interventions for Short Term Goal #1  Placed medication on chairside table where patient sleeps at night for easy access   THN CM Short Term Goal #2 (0-30 days)  Patient will demonstrate proper use of spriva inhaler in the next 2 weeks.   THN CM Short Term Goal #2 Start Date  04/19/15   [facility] CM Short Term Goal #2 Met Date  05/05/15

## 2015-05-16 ENCOUNTER — Emergency Department
Admission: EM | Admit: 2015-05-16 | Discharge: 2015-05-16 | Disposition: A | Discharge status: Home / Self Care | Payer: Commercial Managed Care - HMO | Attending: Emergency Medicine | Admitting: Emergency Medicine

## 2015-05-16 ENCOUNTER — Encounter: Payer: Self-pay | Admitting: Emergency Medicine

## 2015-05-16 ENCOUNTER — Emergency Department: Payer: Commercial Managed Care - HMO

## 2015-05-16 DIAGNOSIS — I10 Essential (primary) hypertension: Secondary | ICD-10-CM | POA: Insufficient documentation

## 2015-05-16 DIAGNOSIS — M810 Age-related osteoporosis without current pathological fracture: Secondary | ICD-10-CM | POA: Diagnosis not present

## 2015-05-16 DIAGNOSIS — R1032 Left lower quadrant pain: Secondary | ICD-10-CM | POA: Diagnosis not present

## 2015-05-16 DIAGNOSIS — Z87891 Personal history of nicotine dependence: Secondary | ICD-10-CM | POA: Diagnosis not present

## 2015-05-16 DIAGNOSIS — E785 Hyperlipidemia, unspecified: Secondary | ICD-10-CM | POA: Diagnosis not present

## 2015-05-16 DIAGNOSIS — J449 Chronic obstructive pulmonary disease, unspecified: Secondary | ICD-10-CM | POA: Insufficient documentation

## 2015-05-16 DIAGNOSIS — Z79899 Other long term (current) drug therapy: Secondary | ICD-10-CM | POA: Diagnosis not present

## 2015-05-16 DIAGNOSIS — M545 Low back pain: Secondary | ICD-10-CM | POA: Diagnosis present

## 2015-05-16 DIAGNOSIS — R109 Unspecified abdominal pain: Secondary | ICD-10-CM

## 2015-05-16 LAB — COMPREHENSIVE METABOLIC PANEL
ALK PHOS: 116 U/L (ref 38–126)
ALT: 14 U/L (ref 14–54)
AST: 25 U/L (ref 15–41)
Albumin: 3.7 g/dL (ref 3.5–5.0)
Anion gap: 8 (ref 5–15)
BILIRUBIN TOTAL: 0.4 mg/dL (ref 0.3–1.2)
BUN: 5 mg/dL — ABNORMAL LOW (ref 6–20)
CALCIUM: 8.5 mg/dL — AB (ref 8.9–10.3)
CO2: 28 mmol/L (ref 22–32)
CREATININE: 0.48 mg/dL (ref 0.44–1.00)
Chloride: 94 mmol/L — ABNORMAL LOW (ref 101–111)
Glucose, Bld: 91 mg/dL (ref 65–99)
Potassium: 3.5 mmol/L (ref 3.5–5.1)
Sodium: 130 mmol/L — ABNORMAL LOW (ref 135–145)
Total Protein: 6.7 g/dL (ref 6.5–8.1)

## 2015-05-16 LAB — CBC WITH DIFFERENTIAL/PLATELET
Basophils Absolute: 0 10*3/uL (ref 0–0.1)
Basophils Relative: 1 %
Eosinophils Absolute: 0.1 10*3/uL (ref 0–0.7)
Eosinophils Relative: 1 %
HCT: 39.2 % (ref 35.0–47.0)
HEMOGLOBIN: 13.3 g/dL (ref 12.0–16.0)
LYMPHS ABS: 1.6 10*3/uL (ref 1.0–3.6)
LYMPHS PCT: 25 %
MCH: 31.5 pg (ref 26.0–34.0)
MCHC: 33.9 g/dL (ref 32.0–36.0)
MCV: 93.1 fL (ref 80.0–100.0)
Monocytes Absolute: 0.5 10*3/uL (ref 0.2–0.9)
Monocytes Relative: 8 %
NEUTROS PCT: 65 %
Neutro Abs: 4.1 10*3/uL (ref 1.4–6.5)
Platelets: 139 10*3/uL — ABNORMAL LOW (ref 150–440)
RBC: 4.21 MIL/uL (ref 3.80–5.20)
RDW: 15.4 % — ABNORMAL HIGH (ref 11.5–14.5)
WBC: 6.3 10*3/uL (ref 3.6–11.0)

## 2015-05-16 LAB — URINALYSIS COMPLETE WITH MICROSCOPIC (ARMC ONLY)
Bacteria, UA: NONE SEEN
Bilirubin Urine: NEGATIVE
GLUCOSE, UA: NEGATIVE mg/dL
Hgb urine dipstick: NEGATIVE
Ketones, ur: NEGATIVE mg/dL
Leukocytes, UA: NEGATIVE
Nitrite: NEGATIVE
Protein, ur: NEGATIVE mg/dL
SPECIFIC GRAVITY, URINE: 1.002 — AB (ref 1.005–1.030)
pH: 6 (ref 5.0–8.0)

## 2015-05-16 LAB — LIPASE, BLOOD: LIPASE: 50 U/L (ref 11–51)

## 2015-05-16 MED ORDER — MORPHINE SULFATE (PF) 2 MG/ML IV SOLN
2.0000 mg | Freq: Once | INTRAVENOUS | Status: AC
  Administered 2015-05-16: 2 mg via INTRAVENOUS
  Filled 2015-05-16: qty 1

## 2015-05-16 NOTE — ED Notes (Signed)
Pt states "I want to go home". Pt reports pain is 8/10 but refuses pain medication.

## 2015-05-16 NOTE — ED Notes (Signed)
Patient able to ambulate to the toilet with minimal assistance

## 2015-05-16 NOTE — ED Provider Notes (Signed)
[facility] Emergency Department Provider Note  ____________________________________________  Time seen: Seen upon arrival to the emergency department  I have reviewed the triage vital signs and the nursing notes.   HISTORY  Chief Complaint Back Pain    HPI Alyssa Ortega is a 82 y.o. female with a history of chronic back pain as well as hypertension is presenting to the emergency department today with back pain and the inability to ambulate. She says that her pain is worsening over the past 3 days. She was evaluated by her primary care doctor on the 14th of this month and was requesting a more potent pain medication for her back. She denies any recent injury. She originally called out to the ambulance today using her med alert because of sliding down from a chair onto the floor. She denies any numbness but does have weakness secondary to pain in her back. Patient was able to ambulate several steps with EMS but then had to be carried to the stretcher the rest of the way. Currently the pain is a 10 out of 10.The back pain is worse with movement. It is sharp and cramping. Does not report any nausea, vomiting or diarrhea. No loss of bowel or bladder continence.   Past Medical History  Diagnosis Date  . Hyperlipidemia   . Hypertension   . GI bleed 10/2007    secondary to duodenal ulcers  . History of epistaxis     had MRI by Dr. Carlis Abbott  . Hip fracture ([place]) 3/09    left, s/p hemiarthroplasty  . Rib fractures     traumatic  . Insomnia   . Osteoporosis   . Bursitis   . Dehydration   . Gastritis     Patient Active Problem List   Diagnosis Date Noted  . COPD (chronic obstructive pulmonary disease) ([place]) 04/15/2015  . Malnutrition ([place]) 03/18/2015  . Sinusitis 02/19/2015  . Trochanteric bursitis of right hip 10/20/2014  . Benign essential HTN 08/05/2014  . Hypercholesterolemia without hypertriglyceridemia 08/05/2014  . Cellulitis and abscess of leg  07/01/2014  . Wedge fracture of lumbar vertebra ([place]) 09/30/2013  . Degenerative arthritis of lumbar spine 07/25/2013  . Screening for breast cancer 12/22/2010  . Alcohol abuse, episodic 12/22/2010  . Weight loss, non-intentional 12/22/2010  . Tinnitus of left ear 12/22/2010  . Thrombocytopenia ([place]) 12/22/2010  . Osteoporosis with fracture   . Rib fractures   . History of epistaxis   . GI bleed 10/29/2007    Past Surgical History  Procedure Laterality Date  . Hemiarthroplasty hip  3/09, 10/11    left hip  . Joint replacement  2009    Left hip  . Abdominal hysterectomy    . Back surgery      Current Outpatient Rx  Name  Route  Sig  Dispense  Refill  . acetaminophen (TYLENOL) 650 MG CR tablet   Oral   Take 650-1,300 mg by mouth daily as needed for pain. Reported on 04/19/2015         . feeding supplement, ENSURE ENLIVE, (ENSURE ENLIVE) LIQD   Oral   Take 237 mLs by mouth 2 (two) times daily between meals.   237 mL   12   . tiotropium (SPIRIVA HANDIHALER) 18 MCG inhalation capsule   Inhalation   Place 1 capsule (18 mcg total) into inhaler and inhale daily.   30 capsule   12     Allergies Aspirin  Family History  Problem Relation Age of Onset  .  Cancer Son 40    colon  . Arthritis Brother     Social History Social History  Substance Use Topics  . Smoking status: Former Smoker    Types: Cigarettes    Quit date: 12/21/1990  . Smokeless tobacco: Never Used  . Alcohol Use: Yes     Comment: occasional    Review of Systems Constitutional: No fever/chills Eyes: No visual changes. ENT: No sore throat. Cardiovascular: Denies chest pain. Respiratory: Denies shortness of breath. Gastrointestinal: No abdominal pain.  No nausea, no vomiting.  No diarrhea.  No constipation. Genitourinary: Negative for dysuria. Musculoskeletal: As above Skin: Negative for rash. Neurological: Negative for headaches, focal weakness or numbness.  10-point ROS otherwise  negative.  ____________________________________________   PHYSICAL EXAM:  VITAL SIGNS: ED Triage Vitals  Enc Vitals Group     BP --      Pulse Rate 05/16/15 1649 85     Resp --      Temp 05/16/15 1649 98 F (36.7 C)     Temp Source 05/16/15 1649 Oral     SpO2 05/16/15 1649 96 %     Weight 05/16/15 1649 126 lb 1.6 oz (57.199 kg)     Height 05/16/15 1649 5\' 4"  (1.626 m)     Head Cir --      Peak Flow --      Pain Score 05/16/15 1650 10     Pain Loc --      Pain Edu? --      Excl. in [place]? --     Constitutional: Alert and oriented. in no acute distress. Eyes: Conjunctivae are normal. PERRL. EOMI. Head: Atraumatic. Nose: No congestion/rhinnorhea. Mouth/Throat: Mucous membranes are moist.  Oropharynx non-erythematous. Neck: No stridor.   Cardiovascular: Normal rate, regular rhythm. Grossly normal heart sounds.  Good peripheral circulation. Respiratory: Normal respiratory effort.  No retractions. Lungs CTAB. Gastrointestinal: Soft and nontender.  No abdominal bruits. No CVA tenderness. Musculoskeletal: Mild edema to the bilateral ankles which is equal with mild tenderness to palpation. No erythema or heat. No pus. Edema appears chronic per her PCP notes. No joint effusions. Neurologic:  Normal speech and language. No gross focal neurologic deficits are appreciated. No saddle anesthesia light touch. Skin:  Skin is warm, dry and intact. No rash noted. Psychiatric: Mood and affect are normal. Speech and behavior are normal.  ____________________________________________   LABS (all labs ordered are listed, but only abnormal results are displayed)  Labs Reviewed  CBC WITH DIFFERENTIAL/PLATELET - Abnormal; Notable for the following:    RDW 15.4 (*)    Platelets 139 (*)    All other components within normal limits  COMPREHENSIVE METABOLIC PANEL - Abnormal; Notable for the following:    Sodium 130 (*)    Chloride 94 (*)    BUN 5 (*)    Calcium 8.5 (*)    All other components  within normal limits  URINALYSIS COMPLETEWITH MICROSCOPIC ([facility] ONLY) - Abnormal; Notable for the following:    Color, Urine STRAW (*)    APPearance CLEAR (*)    Specific Gravity, Urine 1.002 (*)    Squamous Epithelial / LPF 0-5 (*)    All other components within normal limits  LIPASE, BLOOD   ____________________________________________  EKG   ____________________________________________  RADIOLOGY   IMPRESSION: No acute abnormalities in the abdomen or pelvis with limitations as described above.  Compression deformities L2 through L5. These were identified on previous radiograph performed 06/23/2014. The L2 compression deformity is the most severe  and shows significant retropulsion causing significant canal narrowing.   Electronically Signed By: Skipper Cliche M.D. ____________________________________________   PROCEDURES   ____________________________________________   INITIAL IMPRESSION / ASSESSMENT AND PLAN / ED COURSE  Pertinent labs & imaging results that were available during my care of the patient were reviewed by me and considered in my medical decision making (see chart for details).  Appears to be exacerbation of her chronic symptoms. Also has chronic edema. Negative Dopplers from 2014. Patient does not report any change in swelling.   ----------------------------------------- 8:01 PM on 05/16/2015 -----------------------------------------  Patient with pain greatly reduced after morphine. Was able to ambulate with minimal assistance to the bathroom. Will be discharged home with her neighbor who is a close friend of hers. Likely musculoskeletal back pain. Has been seen recently by her doctor for the same cause. Recommend icy hot and Aspercreme in addition to her usual pain regimen. She understands plan and is willing to comply. No clinical signs of spinal compression. ____________________________________________   FINAL CLINICAL IMPRESSION(S) / ED  DIAGNOSES  Final diagnoses:  Left flank pain   Low back pain.   Orbie Pyo, MD 05/16/15 2002

## 2015-05-16 NOTE — ED Notes (Signed)
Patient brought in by [facility] from home c/o Lower back pain for 1  Week. Per EMS they were called out for public assist, when they arrived patient was slid half way down in her chair, patient was able to ambulate a few steps then had to be carried to the stretcher. Patient currently c/o 10/10 pain

## 2015-05-17 ENCOUNTER — Telehealth: Payer: Self-pay | Admitting: Family Medicine

## 2015-05-17 ENCOUNTER — Other Ambulatory Visit: Payer: Self-pay | Admitting: *Deleted

## 2015-05-17 DIAGNOSIS — Z9181 History of falling: Secondary | ICD-10-CM

## 2015-05-17 DIAGNOSIS — Z599 Problem related to housing and economic circumstances, unspecified: Secondary | ICD-10-CM

## 2015-05-17 NOTE — Telephone Encounter (Signed)
Called, no answer, unable to leave a message, will try again later.  

## 2015-05-17 NOTE — Patient Outreach (Signed)
[place] [facility]) Care Management  [id number]  Alyssa Ortega [id number]   Incoming call from Alyssa Ortega neighbor/friend, and I was able to speak with Alyssa Ortega to obtain verbal consent to speak with Alyssa Ortega on her behalf, verbal consent obtained.  Alyssa Ortega called concerning patient continued complaint of back pain, and right side pain. Alyssa Ortega states not much relief since her visit to the Emergency room on yesterday, she has taken her tylenol this am with not much relief, patient denies shortness of breath.  Alyssa Ortega states that she will be able to provide transportation to MD appointment and patient agreeable to PCP visit. [name] also discussed concern regarding patient needing more assistance at home.    Plan RNCM will place call to Dr. Park Liter office to arrange office visit and communicate to patient. RNCM will collaborate with [facility] social worker for available resources  Joylene Draft, RN, [place] Management [phone]- Mobile [phone]- [place] .

## 2015-05-17 NOTE — Telephone Encounter (Signed)
Insurance covers advanced home care.

## 2015-05-17 NOTE — Patient Outreach (Signed)
[facility] [facility]) Care Management  [id number]  OSHA BORUTA [id number]   Care Coordination  Placed telephone call to Dr. Park Liter office regarding concern received from Mrs. Guhl and her friend [name], regarding patient continued pain in her back and right side denies shortness of breath she has taken her tylenol without relief.  Spoke with representative to provide concerns and to notify them that, [name] will be able to provide transportation for follow up MD office visit.   Appointment arranged for 3/21 at 3  pm.   Placed call to Mrs.Andreyah, Demario is still at the home of Mrs. Mikulecky to relay information of appointment date Discussed with Mrs.Claborn to use her alert button for 911 for urgent request for increased pain, any shortness of breath she verbalizes understanding.  Joylene Draft, RN, [place] Management [phone]- Mobile [phone]- Toll Free Main Office

## 2015-05-17 NOTE — Telephone Encounter (Signed)
Forward to provider

## 2015-05-17 NOTE — Telephone Encounter (Signed)
Needs appointment to discuss. No one will pay for that. It would be out of pocket or she can look into assisted living. We can do Advanced home care for skilled nursing and eval

## 2015-05-17 NOTE — Telephone Encounter (Signed)
Per patient request that she needs a nurse all day with her to help her around. Please call patient regarding this, thanks.

## 2015-05-18 ENCOUNTER — Encounter: Payer: Self-pay | Admitting: Family Medicine

## 2015-05-18 ENCOUNTER — Ambulatory Visit (INDEPENDENT_AMBULATORY_CARE_PROVIDER_SITE_OTHER): Payer: Commercial Managed Care - HMO | Admitting: Family Medicine

## 2015-05-18 ENCOUNTER — Ambulatory Visit
Admission: RE | Admit: 2015-05-18 | Discharge: 2015-05-18 | Disposition: A | Discharge status: Home / Self Care | Payer: Commercial Managed Care - HMO | Source: Ambulatory Visit | Attending: Family Medicine | Admitting: Family Medicine

## 2015-05-18 VITALS — BP 158/80 | HR 90 | Temp 99.4°F | Wt 133.0 lb

## 2015-05-18 DIAGNOSIS — M4854XA Collapsed vertebra, not elsewhere classified, thoracic region, initial encounter for fracture: Secondary | ICD-10-CM | POA: Insufficient documentation

## 2015-05-18 DIAGNOSIS — M8588 Other specified disorders of bone density and structure, other site: Secondary | ICD-10-CM | POA: Insufficient documentation

## 2015-05-18 DIAGNOSIS — M8448XA Pathological fracture, other site, initial encounter for fracture: Secondary | ICD-10-CM

## 2015-05-18 DIAGNOSIS — R627 Adult failure to thrive: Secondary | ICD-10-CM | POA: Diagnosis not present

## 2015-05-18 DIAGNOSIS — J449 Chronic obstructive pulmonary disease, unspecified: Secondary | ICD-10-CM | POA: Diagnosis not present

## 2015-05-18 DIAGNOSIS — M546 Pain in thoracic spine: Secondary | ICD-10-CM

## 2015-05-18 DIAGNOSIS — F1010 Alcohol abuse, uncomplicated: Secondary | ICD-10-CM

## 2015-05-18 DIAGNOSIS — M4856XA Collapsed vertebra, not elsewhere classified, lumbar region, initial encounter for fracture: Secondary | ICD-10-CM | POA: Insufficient documentation

## 2015-05-18 DIAGNOSIS — E46 Unspecified protein-calorie malnutrition: Secondary | ICD-10-CM

## 2015-05-18 MED ORDER — TRAMADOL HCL 50 MG PO TABS: 50.0000 mg | ORAL_TABLET | Freq: Three times a day (TID) | ORAL | Status: DC | PRN

## 2015-05-18 MED ORDER — TIOTROPIUM BROMIDE MONOHYDRATE 1.25 MCG/ACT IN AERS: 1.0000 | INHALATION_SPRAY | Freq: Every day | RESPIRATORY_TRACT | Status: DC

## 2015-05-18 NOTE — Assessment & Plan Note (Signed)
On CT at ER. Some retropulsion suggestive of canal narrowing. Significant concern for thoracic compression fracture. Will check x-ray of her thorax- ordered today. May need kyphoplasty. Referral to orthopedics made today.

## 2015-05-18 NOTE — Progress Notes (Signed)
BP 158/80 mmHg  Pulse 90  Temp(Src) 99.4 F (37.4 C)  Wt 133 lb (60.328 kg)  SpO2 96%  LMP  (LMP Unknown)   Subjective:    Patient ID: Alyssa Ortega, female    DOB: 1930/12/18, 82 y.o.   MRN: [id number]  HPI: Alyssa Ortega is a 82 y.o. female  Chief Complaint  Patient presents with  . Back Pain  . Medication Concern    Patients granddaughter states that she does not think that she is able to actually inhale the powder out of the inhaler   ER FOLLOW UP- called rescue for acute pain of 10/10 Time since discharge: 2 days Hospital/facility: [facility] Diagnosis: chronic back pain Procedures/tests: lumbar x-ray Consultants: None New medications: None Discharge instructions: Follow up here  Status: stable  CT Abdomen/Pelvis: Compression deformities L2 through L5. These were identified on previous radiograph performed 06/23/2014. The L2 compression deformity is the most severe and shows significant retropulsion causing significant canal narrowing.  BACK PAIN- has been taking her tylenol TID without any benefit in a lot more pain in the thorax radiating around by her ribs Duration: 3 days Mechanism of injury: unknown Location: L>R, midline, bilateral and upper back Onset: sudden Severity: severe Quality: sharp Frequency: intermittent Radiation: around ribs Aggravating factors: movement and coughing Alleviating factors: nothing Status: worse Treatments attempted: rest, APAP and physical therapy  Relief with NSAIDs?: No NSAIDs Taken Nighttime pain:  yes Paresthesias / decreased sensation:  no Bowel / bladder incontinence:  no Fevers:  no Dysuria / urinary frequency:  no   Having a lot of trouble at home. Does not want to leave her house. Concern about being by herself and her ability to care for herself. Referral to [facility] for social worker and skilled nursing for pain management made today. Discussed that having someone in all the time would likely not be covered by insurance.  She doesn't want to pursue this at this time.   Relevant past medical, surgical, family and social history reviewed and updated as indicated. Interim medical history since our last visit reviewed. Allergies and medications reviewed and updated.  Review of Systems  Constitutional: Negative.   Respiratory: Negative.   Cardiovascular: Negative.   Musculoskeletal: Positive for myalgias and back pain. Negative for joint swelling, arthralgias, gait problem, neck pain and neck stiffness.  Skin: Negative.   Psychiatric/Behavioral: Negative.     Per HPI unless specifically indicated above     Objective:    BP 158/80 mmHg  Pulse 90  Temp(Src) 99.4 F (37.4 C)  Wt 133 lb (60.328 kg)  SpO2 96%  LMP  (LMP Unknown)  Wt Readings from Last 3 Encounters:  05/18/15 133 lb (60.328 kg)  05/16/15 126 lb 1.6 oz (57.199 kg)  05/11/15 131 lb (59.421 kg)    Physical Exam  Constitutional: She is oriented to person, place, and time. She appears well-developed and well-nourished. No distress.  HENT:  Head: Normocephalic and atraumatic.  Right Ear: Hearing normal.  Left Ear: Hearing normal.  Nose: Nose normal.  Eyes: Conjunctivae and lids are normal. Right eye exhibits no discharge. Left eye exhibits no discharge. No scleral icterus.  Pulmonary/Chest: Effort normal. No respiratory distress.  Musculoskeletal: She exhibits edema (L ankle) and tenderness.  Tenderness to palpation of her spinous processes T4-9, especially at T9 which reproduces pain. Kyphotic, antalgic gait  Neurological: She is alert and oriented to person, place, and time. She displays normal reflexes. No cranial nerve deficit. She exhibits normal muscle tone.  Coordination normal.  Skin: Skin is warm, dry and intact. No rash noted. No erythema. No pallor.  Psychiatric: She has a normal mood and affect. Her speech is normal and behavior is normal. Judgment and thought content normal. Cognition and memory are normal.  Nursing note and  vitals reviewed.   Results for orders placed or performed during the hospital encounter of 05/16/15  CBC with Differential  Result Value Ref Range   WBC 6.3 3.6 - 11.0 K/uL   RBC 4.21 3.80 - 5.20 MIL/uL   Hemoglobin 13.3 12.0 - 16.0 g/dL   HCT 39.2 35.0 - 47.0 %   MCV 93.1 80.0 - 100.0 fL   MCH 31.5 26.0 - 34.0 pg   MCHC 33.9 32.0 - 36.0 g/dL   RDW 15.4 (H) 11.5 - 14.5 %   Platelets 139 (L) 150 - 440 K/uL   Neutrophils Relative % 65 %   Neutro Abs 4.1 1.4 - 6.5 K/uL   Lymphocytes Relative 25 %   Lymphs Abs 1.6 1.0 - 3.6 K/uL   Monocytes Relative 8 %   Monocytes Absolute 0.5 0.2 - 0.9 K/uL   Eosinophils Relative 1 %   Eosinophils Absolute 0.1 0 - 0.7 K/uL   Basophils Relative 1 %   Basophils Absolute 0.0 0 - 0.1 K/uL  Comprehensive metabolic panel  Result Value Ref Range   Sodium 130 (L) 135 - 145 mmol/L   Potassium 3.5 3.5 - 5.1 mmol/L   Chloride 94 (L) 101 - 111 mmol/L   CO2 28 22 - 32 mmol/L   Glucose, Bld 91 65 - 99 mg/dL   BUN 5 (L) 6 - 20 mg/dL   Creatinine, Ser 0.48 0.44 - 1.00 mg/dL   Calcium 8.5 (L) 8.9 - 10.3 mg/dL   Total Protein 6.7 6.5 - 8.1 g/dL   Albumin 3.7 3.5 - 5.0 g/dL   AST 25 15 - 41 U/L   ALT 14 14 - 54 U/L   Alkaline Phosphatase 116 38 - 126 U/L   Total Bilirubin 0.4 0.3 - 1.2 mg/dL   GFR calc non Af Amer >60 >60 mL/min   GFR calc Af Amer >60 >60 mL/min   Anion gap 8 5 - 15  Lipase, blood  Result Value Ref Range   Lipase 50 11 - 51 U/L  Urinalysis complete, with microscopic ([facility] only)  Result Value Ref Range   Color, Urine STRAW (A) YELLOW   APPearance CLEAR (A) CLEAR   Glucose, UA NEGATIVE NEGATIVE mg/dL   Bilirubin Urine NEGATIVE NEGATIVE   Ketones, ur NEGATIVE NEGATIVE mg/dL   Specific Gravity, Urine 1.002 (L) 1.005 - 1.030   Hgb urine dipstick NEGATIVE NEGATIVE   pH 6.0 5.0 - 8.0   Protein, ur NEGATIVE NEGATIVE mg/dL   Nitrite NEGATIVE NEGATIVE   Leukocytes, UA NEGATIVE NEGATIVE   RBC / HPF 0-5 0 - 5 RBC/hpf   WBC, UA 0-5 0 - 5  WBC/hpf   Bacteria, UA NONE SEEN NONE SEEN   Squamous Epithelial / LPF 0-5 (A) NONE SEEN      Assessment & Plan:   Problem List Items Addressed This Visit      Respiratory   COPD (chronic obstructive pulmonary disease) ([place])    Not doing well with the handihaler. Unable to get medicine in. Will change to respimat. Rx sent to her pharmacy.       Relevant Medications   Tiotropium Bromide Monohydrate (SPIRIVA RESPIMAT) 1.25 MCG/ACT AERS     Musculoskeletal and Integument  Compression fracture of lumbar spine, non-traumatic    On CT at ER. Some retropulsion suggestive of canal narrowing. Significant concern for thoracic compression fracture. Will check x-ray of her thorax- ordered today. May need kyphoplasty. Referral to orthopedics made today.      Relevant Orders   Ambulatory referral to Orthopedic Surgery   Ambulatory referral to Social Work   Ambulatory referral to [place]     Other   Alcohol abuse, episodic   Relevant Orders   Ambulatory referral to Social Work   Ambulatory referral to [place]   Malnutrition [facility])   Relevant Orders   Ambulatory referral to Social Work   Ambulatory referral to [place]   Adult failure to thrive   Relevant Orders   Ambulatory referral to Social Work   Ambulatory referral to [place]    Other Visit Diagnoses    Midline thoracic back pain    -  Primary    Concern for compression fx. X-ray ordered. Tramadol for pain- take at night. Referral to orthopedics made today.    Relevant Medications    traMADol (ULTRAM) 50 MG tablet    Other Relevant Orders    DG Thoracic Spine W/Swimmers    Ambulatory referral to Orthopedic Surgery        Follow up plan: Return As scheduled.

## 2015-05-18 NOTE — Telephone Encounter (Signed)
Patient is scheduled to be seen today at 3:00pm

## 2015-05-18 NOTE — Assessment & Plan Note (Signed)
Not doing well with the handihaler. Unable to get medicine in. Will change to respimat. Rx sent to her pharmacy.

## 2015-05-19 ENCOUNTER — Telehealth: Payer: Self-pay | Admitting: Family Medicine

## 2015-05-19 NOTE — Telephone Encounter (Signed)
Called patient. She has another compression fracture. Will take tramadol as needed. Already has an appointment with ortho per patient. Continue to monitor.

## 2015-05-20 ENCOUNTER — Other Ambulatory Visit: Payer: Self-pay | Admitting: *Deleted

## 2015-05-20 NOTE — Patient Outreach (Addendum)
[place] [facility]) Care Management  [id number]  ASTACIA RUSCHE [id number]   Phone call to patient to schedule initial home visit to assess for increased need for in home support.  Home visit scheduled for 05/31/15 at 10:30am.  Patient stated that she was in pain and was due for back surgery in April.  Calling the doctor recommended, however patient declined stating that her friend was on her way over to check on her.    Sheralyn Boatman [facility] Care Management [phone]

## 2015-05-20 NOTE — Patient Outreach (Signed)
Alyssa Ortega) Care Management  09/07/1971  Alyssa Ortega [birth year] [id number]   Telephone call to [name], HIPPA verified. Patient reports continued back pain, she discussed her recent xray and need to see back doctor, [name] states she does not have appointment yet but waiting on that Doctor office to call.  Back Pain [name] states has a new pain pill to take, but states she has not taken it yet, encouraged patient to take medication this am, patient is able to state that she can take pill, tramadol every 8 hours as needed. Encouraged patient to keep medication near her chairside.  Nutrition  Patient reports she has drank one ensure this morning already, and her daughter recently bought her a 24 pack. Encouraged patient to drink at least two ensure a day, discussed importance of nutrition and protein intake.  Fall Risk Patient reports wearing her life line button , at all times and encouraged to use for emergencies. Patient reports visit from physical therapist today, this is his last visit, states they did not walk today.  Patient lives at home alone [name] lives at home alone and desires to stay at home, her friend Alyssa Ortega has visited her this am and will return this pm. [facility] social worker has been consulted to assist options to assist patient at home.  Plan I will discuss patient case with [facility] social worker. RNCM has home visit scheduled for 4/3. [facility] CM Care Plan Problem One        Most Recent Value   Care Plan Problem One  High Fall Risk   Role Documenting the Problem One  Care Management Coordinator   Care Plan for Problem One  Active   THN Long Term Goal (31-90 days)  Patient will report increase knowledge of fall prevention and safety   THN Long Term Goal Start Date  02/05/15   THN Long Term Goal Met Date  03/30/15   [facility] CM Short Term Goal #1 (0-30 days)  Patient will  not experience a fall in the next 30 days   THN CM Short Term  Goal #1 Start Date  02/05/15   [facility] CM Short Term Goal #1 Met Date  03/30/15   THN CM Short Term Goal #2 (0-30 days)  Patient will participate in exercises taught by physical therapy at least 5 days a week   THN CM Short Term Goal #2 Start Date  03/30/15   [facility] CM Short Term Goal #2 Met Date  04/19/15    [facility] CM Care Plan Problem Two        Most Recent Value   Care Plan Problem Two  Weight loss / Decreased appetite concern   Role Documenting the Problem Two  Care Management Coordinator   Care Plan for Problem Two  Active   Interventions for Problem Two Long Term Goal   Reinforced on benefits of protein, and balanced diet  [goal date restarted.]   THN Long Term Goal (31-90) days  Patient will report increased intake in the 31 days   THN Long Term Goal Start Date  05/14/15 [goal date restarted]   THN CM Short Term Goal #1 (0-30 days)  Patient will began drinking ensure at least twice daily in the next 30 days   THN CM Short Term Goal #1 Start Date  05/14/15 [goal date restarted]   Interventions for Short Term Goal #2   Verified patient has a supply of ensure and reviewed importance of drinking daily to supplement nutrition  to give strength. [goal date restarted]   THN CM Short Term Goal #2 (0-30 days)  Patient will have scales to weigh on and begin to weigh daily and record in the next 14 days    THN CM Short Term Goal #2 Start Date  05/20/15 [goal date restarted,pt focus is her back pain]   Interventions for Short Term Goal #2  RNCM follow up with patient at next visit, she is focused on her back pain problem    [facility] CM Care Plan Problem Three        Most Recent Value   Care Plan Problem Three  Medication Compliance   Role Documenting the Problem Three  Care Management Coordinator   Care Plan for Problem Three  Active   THN Long Term Goal (31-90) days  Patient will demonostrate improved medication compliance in the next 31 days   THN Long Term Goal Start Date  05/14/15 [goal date restart]    Interventions for Problem Three Long Term Goal  Reinforced with patient the importance of taking all medication as prescribed,, reeducated on the purpose of each medication    THN CM Short Term Goal #1 (0-30 days)  Patient will take medications as prescribed in the next 30 days   THN CM Short Term Goal #1 Start Date  05/14/15 Barrie Folk dater restarted]   Interventions for Short Term Goal #1  Reinforced with patient to keep her medication she takes daily at her bedside, reinforced to take her pain medication as prescribed to help decrease discomfort   THN CM Short Term Goal #2 (0-30 days)  Patient will demonstrate proper use of spriva inhaler in the next 2 weeks.   THN CM Short Term Goal #2 Start Date  04/19/15   [facility] CM Short Term Goal #2 Met Date  05/05/15     Joylene Draft, RN, [place] Care Management [phone]- Mobile [phone]- Toll Free Main Office

## 2015-05-25 ENCOUNTER — Other Ambulatory Visit: Payer: Self-pay | Admitting: *Deleted

## 2015-05-25 NOTE — Patient Outreach (Signed)
[place] [facility]) Care Management  [id number]  ERMON SARVIS [id number]  Telephone call to Mrs.Kijek to follow up on message received in basket regarding patient canceling our scheduled visit on Monday, April 3. Mrs.Rozenberg states that it is okay that we come for home visit on Monday, she just has to go out to the bank sometime that day.  Mrs.Liz continues to complain of back pain, when asked if she had taken her tramadol she states not , reminded patient of taking medication prescribed by MD patient is able to state frequency of how medication ordered.  Mrs.Rabanal states that she is not sure when her appointment with Dr.Miller is about her back pain, and agree that is okay that I call find out and let her know.  Call placed to [place] office patient's appointment is scheduled for 4/4 at 1 pm, informed Mrs.Hopkins as she is able to teachback appointment date, time and MD.  Plan RNCM will meet patient in home for visit on 05/31/15, unless otherwise notified.  Joylene Draft, RN, [place] Management [phone]- Mobile [phone]- Toll Free Main Office

## 2015-05-31 ENCOUNTER — Other Ambulatory Visit: Payer: Self-pay | Admitting: *Deleted

## 2015-05-31 ENCOUNTER — Ambulatory Visit: Payer: Self-pay | Admitting: *Deleted

## 2015-05-31 NOTE — Patient Outreach (Signed)
[place] [facility]) Care Management  [id number]  Alyssa Ortega [id number]   Arrived at patient home for scheduled home visit patient is not at home, called her telephone number , no answer, left HIPAA compliant telephone message.   Plan Will contact patient within the next 3 days to reschedule [place], RN, [place] Care Management [phone]- Mobile [phone]- [place]

## 2015-05-31 NOTE — Patient Outreach (Signed)
  Alyssa Ortega) Care Management  Alyssa Ortega Social Work  [id number]  Alyssa Ortega [id number]  Subjective:  Patient is a 82 year old female.  Objective:   Encounter Medications:  Outpatient Encounter Prescriptions as of 05/31/2015  Medication Sig Note  . acetaminophen (TYLENOL) 650 MG CR tablet Take 650-1,300 mg by mouth daily as needed for pain. Reported on 04/19/2015 05/05/2015: Patient reports taking only one tablet at night  . feeding supplement, ENSURE ENLIVE, (ENSURE ENLIVE) LIQD Take 237 mLs by mouth 2 (two) times daily between meals. (Patient not taking: Reported on 05/18/2015)   . Tiotropium Bromide Monohydrate (SPIRIVA RESPIMAT) 1.25 MCG/ACT AERS Inhale 1 puff into the lungs daily.   . traMADol (ULTRAM) 50 MG tablet Take 1 tablet (50 mg total) by mouth every 8 (eight) hours as needed.    Facility-Administered Encounter Medications as of 05/31/2015  Medication  . albuterol (PROVENTIL) (2.5 MG/3ML) 0.083% nebulizer solution 2.5 mg    Functional Status:  In your present state of health, do you have any difficulty performing the following activities: 03/30/2015 02/05/2015  Hearing? N N  Vision? Y Y  Difficulty concentrating or making decisions? N Y  Walking or climbing stairs? Y Y  Dressing or bathing? N N  Doing errands, shopping? Alyssa Ortega  Preparing Food and eating ? N N  Using the Toilet? N N  In the past six months, have you accidently leaked urine? N N  Do you have problems with loss of bowel control? N N  Managing your Medications? N N  Managing your Finances? N Y  Housekeeping or managing your Housekeeping? N Y    Fall/Depression Screening:  PHQ 2/9 Scores 03/30/2015 02/05/2015 01/28/2015 08/05/2014  PHQ - 2 Score 0 0 0 0    Assessment: This social worker arrived to patient's home for initial home visit, however patient was not at home.  Plan:  This Education officer, museum will contact patient to re-schedule home visit.    Alyssa Ortega [facility] Care  Management [phone]

## 2015-06-03 ENCOUNTER — Other Ambulatory Visit: Payer: Self-pay | Admitting: *Deleted

## 2015-06-03 NOTE — Patient Outreach (Signed)
[place] [facility]) Care Management  [id number]  NASTEHO URAM [id number]   Telephone assessement  Follow up telephone call to Mrs.Dresch regarding rescheduling home visit , as patient not available for appointment on 4/3. Mrs.Kardell reports that she is just arriving home, she has been visiting her neighbor next door, [name] she transports patient by car. I asked Mrs.Fortuno if she was able to attend her appointment with [place], she states she forgot all about it, she states by back doesn't hurt it's my hip. I have offered to reschedule appointment she declined. Patient is agreeable to RN care manager  visiting her on next week.   Plan RNCM will arrange to visit patient in a week and collaborate with LCSW for her availability for co visit. Will send in basket message to Tawni Levy, PCP  Joylene Draft, RN, [place] Care Management [phone]- Mobile [phone]- [place]

## 2015-06-07 ENCOUNTER — Other Ambulatory Visit: Payer: Self-pay | Admitting: *Deleted

## 2015-06-07 NOTE — Patient Outreach (Signed)
[place] [facility]) Care Management  [id number]  JASONNA VANROSSUM [id number]   Phone call to patient to re-schedule appointment that she previously cancelled for 05/31/15. Patient's phone just rang, there was no answer.  This Education officer, museum will call back within one week.   Sheralyn Boatman [facility] Care Management [phone]

## 2015-06-09 ENCOUNTER — Encounter: Payer: Self-pay | Admitting: *Deleted

## 2015-06-09 ENCOUNTER — Other Ambulatory Visit: Payer: Self-pay | Admitting: *Deleted

## 2015-06-09 ENCOUNTER — Telehealth: Payer: Self-pay | Admitting: Family Medicine

## 2015-06-09 NOTE — Telephone Encounter (Signed)
Ms Alyssa Ortega called to notify the pts PCP that she had dropped a canister of sugar on her R foot and ankle yesterday and it is red and swollen.FYI

## 2015-06-09 NOTE — Patient Outreach (Addendum)
[facility] ([facility]) Care Management   06/09/2015  Alyssa Ortega 12/09/1930 [id number]  Alyssa Ortega is an 82 y.o. female  Subjective: " I dropped my full glass canister on my foot and yesterday the container did not break"  Objective:  BP 122/70 mmHg  Pulse 88  Resp 18  SpO2 94%  LMP  (LMP Unknown) Review of Systems  Constitutional: Negative.   HENT: Negative.   Eyes: Negative.   Respiratory: Negative for cough and shortness of breath.   Cardiovascular: Positive for leg swelling. Negative for chest pain.       Bilateral lower extremity edema, right top of foot and ankle area redden and increased swelling  Gastrointestinal: Negative.   Genitourinary: Negative.   Musculoskeletal: Positive for back pain.  Skin: Negative.   Neurological: Negative for dizziness.  Psychiatric/Behavioral: Negative.     Physical Exam  Constitutional: She is oriented to person, place, and time. She appears well-developed and well-nourished.  Cardiovascular: Normal rate and normal heart sounds.   Respiratory: Effort normal.  GI: Soft.  Neurological: She is alert and oriented to person, place, and time.  Skin: Skin is warm and dry.     Psychiatric: She has a normal mood and affect. Her behavior is normal. Judgment and thought content normal.    Encounter Medications:   Outpatient Encounter Prescriptions as of 06/09/2015  Medication Sig Note  . acetaminophen (TYLENOL) 650 MG CR tablet Take 650-1,300 mg by mouth daily as needed for pain. Reported on 04/19/2015 05/05/2015: Patient reports taking only one tablet at night  . feeding supplement, ENSURE ENLIVE, (ENSURE ENLIVE) LIQD Take 237 mLs by mouth 2 (two) times daily between meals.   . Tiotropium Bromide Monohydrate (SPIRIVA RESPIMAT) 1.25 MCG/ACT AERS Inhale 1 puff into the lungs daily.   . traMADol (ULTRAM) 50 MG tablet Take 1 tablet (50 mg total) by mouth every 8 (eight) hours as needed. (Patient not taking: Reported on 06/09/2015)     Facility-Administered Encounter Medications as of 06/09/2015  Medication  . albuterol (PROVENTIL) (2.5 MG/3ML) 0.083% nebulizer solution 2.5 mg    Functional Status:   In your present state of health, do you have any difficulty performing the following activities: 03/30/2015 02/05/2015  Hearing? N N  Vision? Y Y  Difficulty concentrating or making decisions? N Y  Walking or climbing stairs? Y Y  Dressing or bathing? N N  Doing errands, shopping? Y Y  Preparing Food and eating ? N N  Using the Toilet? N N  In the past six months, have you accidently leaked urine? N N  Do you have problems with loss of bowel control? N N  Managing your Medications? N N  Managing your Finances? N Y  Housekeeping or managing your Housekeeping? N Y    Fall/Depression Screening:    PHQ 2/9 Scores 06/09/2015 03/30/2015 02/05/2015 01/28/2015 08/05/2014  PHQ - 2 Score 0 0 0 0 0    Assessment:  Routine home visit, patient resting in recliner chair.  Right foot Patient resting in recliner chair with right foot elevated, patient reports she able to walk and has been up walking in living room kitchen . Top of right foot and right ankle redden and swollen skin intact. Patent reports taking tylenol for discomfort with some relief, states tramadol does not help.Noted patient with same shuffling gait as previous when up walking during visit. Encouraged patient to keep foot elevated as much as possible, patient had cool pak, and we wrapped it towel   and placed against foot.  Concern for nutrition  Mrs.Sockwell reports she is drinking sweet tea, when asked what she had eaten today. Patient reports she drank 2 ensures on yesterday but not yet on today, patient does have good supply of ensure, eggs, peanut butter available at home. Patient talks a lot about different foods that she likes or cooks, not sure that she actually does either. Reinforced importance of nutrition .    Reminded patient of upcoming appointment with PCP  on Friday 4/14, she stated she was aware and that her son in law will provide transportation, I have offered to call him she declined stated she would do it.     Plan:  Notify Park Liter , PCP of concern regarding redness to right foot after dropping container on foot, patient missed appointment with orthopedic, discussed that patient has missed scheduled  [facility] social worker visit, reschedule pending to help assess further resources available. RNCM will schedule home visit with patient within a month.  [facility] CM Care Plan Problem One        Most Recent Value   Care Plan Problem One  High Fall Risk   Role Documenting the Problem One  Care Management Coordinator   Care Plan for Problem One  Not Active   [facility] Long Term Goal (31-90 days)  Patient will report increase knowledge of fall prevention and safety   [facility] Long Term Goal Start Date  02/05/15   [facility] Long Term Goal Met Date  03/30/15   [facility] CM Short Term Goal #1 (0-30 days)  Patient will  not experience a fall in the next 30 days   [facility] CM Short Term Goal #1 Start Date  02/05/15   [facility] CM Short Term Goal #1 Met Date  03/30/15   [facility] CM Short Term Goal #2 (0-30 days)  Patient will participate in exercises taught by physical therapy at least 5 days a week   [facility] CM Short Term Goal #2 Start Date  03/30/15   [facility] CM Short Term Goal #2 Met Date  04/19/15    [facility] CM Care Plan Problem Two        Most Recent Value   Care Plan Problem Two  Weight loss / Decreased appetite concern   Role Documenting the Problem Two  Care Management Coordinator   Care Plan for Problem Two  Active   Interventions for Problem Two Long Term Goal   Reviewed with patient benefits of eating a balanced diet to prevent weight loss, gain strenght , help fight off infections  [goal date restarted.]   [facility] Long Term Goal (31-90) days  Patient will report increased intake in the 31 days   [facility] Long Term Goal Start Date  05/14/15 [goal date restarted]   [facility] CM Short Term Goal #1 (0-30  days)  Patient will began drinking ensure at least twice daily in the next 30 days   [facility] CM Short Term Goal #1 Start Date  06/09/15 Barrie Folk date restarted]   Interventions for Short Term Goal #2   Patient allowed RN to check her refrigerator to make sure she had ensure available, verified patient has a good supply, encouraged her to drink daily to help with gaining strenght  [goal date restarted]   [facility] CM Short Term Goal #2 (0-30 days)  Patient will have scales to weigh on and begin to weigh daily and record in the next 14 days    [facility] CM Short Term Goal #2 Start Date  05/20/15 [goal date restarted,pt focus is her back pain]   Interventions for Short Term Goal #2  Patient has declined need to weigh at this time    [facility] CM Care Plan Problem Three        Most Recent Value   Care Plan Problem Three  Medication Compliance   Role Documenting the Problem Three  Care Management Coordinator   Care Plan for Problem Three  Active   [facility] Long Term Goal (31-90) days  Patient will demonostrate improved medication compliance in the next 31 days   [facility] Long Term Goal Start Date  05/14/15 [goal date restart]   Interventions for Problem Three Long Term Goal  Reviewed  with patient the importance of taking all medication as prescribed,, reeducated on the purpose of each medication    [facility] CM Short Term Goal #1 (0-30 days)  Patient will take medications as prescribed in the next 30 days   [facility] CM Short Term Goal #1 Start Date  05/14/15 [goal dater restarted]   Interventions for Short Term Goal #1  Viewed patient keeps her tylenol and tramadol near her chairside, encouraged to take to help with discomfort.,   [facility] CM Short Term Goal #2 (0-30 days)  Patient will demonstrate proper use of spriva inhaler in the next 2 weeks.   [facility] CM Short Term Goal #2 Start Date  04/19/15   [facility] CM Short Term Goal #2 Met Date  05/05/15   [facility] CM Short Term Goal #3 (0-30 days)  Patient will attend PCP appointment in the next 3 days   [facility]  CM  Short Term Goal #3 Start Date  06/09/15   Interventions for Short Term Goal #3  Educated patient of the importance of attending PCP visits to evaluate for medical concerns, indentify barriers, verified  patient has transportation, left stickey note on bright paper near her table.     [facility] CM Care Plan Problem One        Most Recent Value   Care Plan Problem One  High Fall Risk   Role Documenting the Problem One  Care Management Coordinator   Care Plan for Problem One  Not Active   [facility] Long Term Goal (31-90 days)  Patient will report increase knowledge of fall prevention and safety   [facility] Long Term Goal Start Date  02/05/15   [facility] Long Term Goal Met Date  03/30/15   [facility] CM Short Term Goal #1 (0-30 days)  Patient will  not experience a fall in the next 30 days   [facility] CM Short Term Goal #1 Start Date  02/05/15   [facility] CM Short Term Goal #1 Met Date  03/30/15   [facility] CM Short Term Goal #2 (0-30 days)  Patient will participate in exercises taught by physical therapy at least 5 days a week   [facility] CM Short Term Goal #2 Start Date  03/30/15   [facility] CM Short Term Goal #2 Met Date  04/19/15    [facility] CM Care Plan Problem Two        Most Recent Value   Care Plan Problem Two  Weight loss / Decreased appetite concern   Role Documenting the Problem Two  Care Management Coordinator   Care Plan for Problem Two  Active   Interventions for Problem Two Long Term Goal   Reviewed with patient benefits of eating a balanced diet to prevent weight loss, gain strenght , help fight off infections  [goal date restarted.]   [facility]   Long Term Goal (31-90) days  Patient will report increased intake in the 31 days   [facility] Long Term Goal Start Date  05/14/15 [goal date restarted]   [facility] CM Short Term Goal #1 (0-30 days)  Patient will began drinking ensure at least twice daily in the next 30 days   [facility] CM Short Term Goal #1 Start Date  06/09/15 [goal date restarted]   Interventions for Short Term Goal #2   Patient allowed RN to check her  refrigerator to make sure she had ensure available, verified patient has a good supply, encouraged her to drink daily to help with gaining strenght  [goal date restarted]   [facility] CM Short Term Goal #2 (0-30 days)  Patient will have scales to weigh on and begin to weigh daily and record in the next 14 days    [facility] CM Short Term Goal #2 Start Date  05/20/15 [goal date restarted,pt focus is her back pain]   Interventions for Short Term Goal #2  Patient has declined need to weigh at this time    [facility] CM Care Plan Problem Three        Most Recent Value   Care Plan Problem Three  Medication Compliance   Role Documenting the Problem Three  Care Management Coordinator   Care Plan for Problem Three  Active   [facility] Long Term Goal (31-90) days  Patient will demonostrate improved medication compliance in the next 31 days   [facility] Long Term Goal Start Date  05/14/15 [goal date restart]   Interventions for Problem Three Long Term Goal  Reviewed  with patient the importance of taking all medication as prescribed,, reeducated on the purpose of each medication    [facility] CM Short Term Goal #1 (0-30 days)  Patient will take medications as prescribed in the next 30 days   [facility] CM Short Term Goal #1 Start Date  05/14/15 [goal dater restarted]   Interventions for Short Term Goal #1  Viewed patient keeps her tylenol and tramadol near her chairside, encouraged to take to help with discomfort.,   [facility] CM Short Term Goal #2 (0-30 days)  Patient will demonstrate proper use of spriva inhaler in the next 2 weeks.   [facility] CM Short Term Goal #2 Start Date  04/19/15   [facility] CM Short Term Goal #2 Met Date  05/05/15   [facility] CM Short Term Goal #3 (0-30 days)  Patient will attend PCP appointment in the next 3 days   [facility]  CM Short Term Goal #3 Start Date  06/09/15   Interventions for Short Term Goal #3  Educated patient of the importance of attending PCP visits to evaluate for medical concerns, indentify barriers, verified  patient has transportation,  left stickey note on bright paper near her table.        Kimberly Glover, RN, PCCN [facility] Care Management [phone]- Mobile [phone]- Toll Free Main Office  

## 2015-06-10 NOTE — Telephone Encounter (Signed)
Patient has a appointment scheduled.

## 2015-06-10 NOTE — Telephone Encounter (Signed)
Forward to provider

## 2015-06-10 NOTE — Telephone Encounter (Signed)
Will need to be seen to make sure she didn't break anything

## 2015-06-11 ENCOUNTER — Ambulatory Visit (INDEPENDENT_AMBULATORY_CARE_PROVIDER_SITE_OTHER): Payer: Commercial Managed Care - HMO | Admitting: Family Medicine

## 2015-06-11 ENCOUNTER — Encounter: Payer: Self-pay | Admitting: Family Medicine

## 2015-06-11 VITALS — BP 138/84 | HR 95 | Temp 98.0°F | Wt 130.0 lb

## 2015-06-11 DIAGNOSIS — M79671 Pain in right foot: Secondary | ICD-10-CM | POA: Diagnosis not present

## 2015-06-11 NOTE — Progress Notes (Signed)
BP 138/84 mmHg  Pulse 95  Temp(Src) 98 F (36.7 C)  Wt 130 lb (58.968 kg)  SpO2 96%  LMP  (LMP Unknown)   Subjective:    Patient ID: Alyssa Ortega, female    DOB: 18-Sep-1930, 82 y.o.   MRN: [id number]  HPI: Alyssa Ortega is a 82 y.o. female  Chief Complaint  Patient presents with  . Foot Injury    Patient dropped a canister of sugar on her feet earlier this week   FOOT PAIN- dropped a glass jar full of 5lbs of sugar onto her foot on Wednesday  Duration: couple of days Involved foot: both of her feet Mechanism of injury: trauma Location: top of her feet Onset: sudden  Severity: severe  Quality:  hurt Frequency: constant Radiation: no Aggravating factors: weight bearing and walking  Alleviating factors: nothing  Status: stable Treatments attempted: linment   Relief with NSAIDs?:  no Weakness with weight bearing or walking: no Morning stiffness: no Swelling: yes Redness: yes Bruising: yes Paresthesias / decreased sensation: no  Fevers:no  Relevant past medical, surgical, family and social history reviewed and updated as indicated. Interim medical history since our last visit reviewed. Allergies and medications reviewed and updated.  Review of Systems  Constitutional: Negative.   Respiratory: Negative.   Cardiovascular: Negative.   Musculoskeletal: Positive for joint swelling, arthralgias and gait problem. Negative for myalgias, back pain, neck pain and neck stiffness.  Skin: Negative.   Psychiatric/Behavioral: Negative.     Per HPI unless specifically indicated above     Objective:    BP 138/84 mmHg  Pulse 95  Temp(Src) 98 F (36.7 C)  Wt 130 lb (58.968 kg)  SpO2 96%  LMP  (LMP Unknown)  Wt Readings from Last 3 Encounters:  06/11/15 130 lb (58.968 kg)  05/18/15 133 lb (60.328 kg)  05/16/15 126 lb 1.6 oz (57.199 kg)    Physical Exam  Constitutional: She is oriented to person, place, and time. She appears well-developed and well-nourished. No  distress.  HENT:  Head: Normocephalic and atraumatic.  Right Ear: Hearing normal.  Left Ear: Hearing normal.  Nose: Nose normal.  Eyes: Conjunctivae and lids are normal. Right eye exhibits no discharge. Left eye exhibits no discharge. No scleral icterus.  Pulmonary/Chest: Effort normal. No respiratory distress.  Musculoskeletal: Normal range of motion. She exhibits edema and tenderness.  Tenderness to palpation of 1st metatarsal of R foot, significant swelling and bruising, no tenderness to palpation of L foot, decreased ROM with antalgic gait  Neurological: She is alert and oriented to person, place, and time.  Skin: Skin is warm, dry and intact. No rash noted. There is erythema. No pallor.  Psychiatric: She has a normal mood and affect. Her speech is normal and behavior is normal. Judgment and thought content normal. Cognition and memory are normal.  Nursing note and vitals reviewed.   Results for orders placed or performed during the hospital encounter of 05/16/15  CBC with Differential  Result Value Ref Range   WBC 6.3 3.6 - 11.0 K/uL   RBC 4.21 3.80 - 5.20 MIL/uL   Hemoglobin 13.3 12.0 - 16.0 g/dL   HCT 39.2 35.0 - 47.0 %   MCV 93.1 80.0 - 100.0 fL   MCH 31.5 26.0 - 34.0 pg   MCHC 33.9 32.0 - 36.0 g/dL   RDW 15.4 (H) 11.5 - 14.5 %   Platelets 139 (L) 150 - 440 K/uL   Neutrophils Relative % 65 %   Neutro  Abs 4.1 1.4 - 6.5 K/uL   Lymphocytes Relative 25 %   Lymphs Abs 1.6 1.0 - 3.6 K/uL   Monocytes Relative 8 %   Monocytes Absolute 0.5 0.2 - 0.9 K/uL   Eosinophils Relative 1 %   Eosinophils Absolute 0.1 0 - 0.7 K/uL   Basophils Relative 1 %   Basophils Absolute 0.0 0 - 0.1 K/uL  Comprehensive metabolic panel  Result Value Ref Range   Sodium 130 (L) 135 - 145 mmol/L   Potassium 3.5 3.5 - 5.1 mmol/L   Chloride 94 (L) 101 - 111 mmol/L   CO2 28 22 - 32 mmol/L   Glucose, Bld 91 65 - 99 mg/dL   BUN 5 (L) 6 - 20 mg/dL   Creatinine, Ser 0.48 0.44 - 1.00 mg/dL   Calcium 8.5  (L) 8.9 - 10.3 mg/dL   Total Protein 6.7 6.5 - 8.1 g/dL   Albumin 3.7 3.5 - 5.0 g/dL   AST 25 15 - 41 U/L   ALT 14 14 - 54 U/L   Alkaline Phosphatase 116 38 - 126 U/L   Total Bilirubin 0.4 0.3 - 1.2 mg/dL   GFR calc non Af Amer >60 >60 mL/min   GFR calc Af Amer >60 >60 mL/min   Anion gap 8 5 - 15  Lipase, blood  Result Value Ref Range   Lipase 50 11 - 51 U/L  Urinalysis complete, with microscopic ([facility] only)  Result Value Ref Range   Color, Urine STRAW (A) YELLOW   APPearance CLEAR (A) CLEAR   Glucose, UA NEGATIVE NEGATIVE mg/dL   Bilirubin Urine NEGATIVE NEGATIVE   Ketones, ur NEGATIVE NEGATIVE mg/dL   Specific Gravity, Urine 1.002 (L) 1.005 - 1.030   Hgb urine dipstick NEGATIVE NEGATIVE   pH 6.0 5.0 - 8.0   Protein, ur NEGATIVE NEGATIVE mg/dL   Nitrite NEGATIVE NEGATIVE   Leukocytes, UA NEGATIVE NEGATIVE   RBC / HPF 0-5 0 - 5 RBC/hpf   WBC, UA 0-5 0 - 5 WBC/hpf   Bacteria, UA NONE SEEN NONE SEEN   Squamous Epithelial / LPF 0-5 (A) NONE SEEN      Assessment & Plan:   Problem List Items Addressed This Visit    None    Visit Diagnoses    Right foot pain    -  Primary    Concern for fracture. Will go for x-ray now. RICE. Await results.     Relevant Orders    DG Foot Complete Right        Follow up plan: Return 2-3 week, for recheck.

## 2015-06-11 NOTE — Patient Instructions (Signed)
Foot Contusion  A foot contusion is a deep bruise to the foot. Contusions happen when an injury causes bleeding under the skin. Signs of bruising include pain, puffiness (swelling), and discolored skin. The contusion may turn blue, purple, or yellow. HOME CARE  Put ice on the injured area.  Put ice in a plastic bag.  Place a towel between your skin and the bag.  Leave the ice on for 15-20 minutes, 03-04 times a day.  Only take medicines as told by your doctor.  Use an elastic wrap only as told. You may remove the wrap for sleeping, showering, and bathing. Take the wrap off if you lose feeling (numb) in your toes, or they turn blue or cold. Put the wrap on more loosely.  Keep the foot raised (elevated) with pillows.  If your foot hurts, avoid standing or walking.  When your doctor says it is okay to use your foot, start using it slowly. If you have pain, lessen how much you use your foot.  See your doctor as told. GET HELP RIGHT AWAY IF:   You have more redness, puffiness, or pain in your foot.  Your puffiness or pain does not get better with medicine.  You lose feeling in your foot, or you cannot move your toes.  Your foot turns cold or blue.  You have pain when you move your toes.  Your foot feels warm.  Your contusion does not get better in 2 days. MAKE SURE YOU:   Understand these instructions.  Will watch this condition.  Will get help right away if you or your child is not doing well or gets worse.   This information is not intended to replace advice given to you by your health care provider. Make sure you discuss any questions you have with your health care provider.   Document Released: 11/23/2007 Document Revised: 08/15/2011 Document Reviewed: 10/20/2014 Elsevier Interactive Patient Education [employer].

## 2015-06-14 ENCOUNTER — Telehealth: Payer: Self-pay | Admitting: Family Medicine

## 2015-06-14 ENCOUNTER — Ambulatory Visit
Admission: RE | Admit: 2015-06-14 | Discharge: 2015-06-14 | Disposition: A | Discharge status: Home / Self Care | Payer: Commercial Managed Care - HMO | Source: Ambulatory Visit | Attending: Family Medicine | Admitting: Family Medicine

## 2015-06-14 ENCOUNTER — Other Ambulatory Visit: Payer: Self-pay | Admitting: *Deleted

## 2015-06-14 DIAGNOSIS — M79671 Pain in right foot: Secondary | ICD-10-CM | POA: Diagnosis present

## 2015-06-14 MED ORDER — MELOXICAM 7.5 MG PO TABS: 7.5000 mg | ORAL_TABLET | Freq: Every day | ORAL | Status: DC

## 2015-06-14 NOTE — Telephone Encounter (Signed)
I tried to call and # was busy- please try her later. Her foot is not broken, just really bruised. Keep it up and keep ice on it (20 min on, 20 min off) and keep taking her tylenol. I'll send through a 24 hour pain medicine to try to help. Thanks.

## 2015-06-14 NOTE — Telephone Encounter (Signed)
Patient notified

## 2015-06-14 NOTE — Patient Outreach (Signed)
[facility]) Care Management  [id number]  Alyssa Ortega [id number]   Phone call to patient to re-schedule home visit.  Per patient, she dropped a canister on her foot.  No broken bones, however badly bruised per follow up with her primary care doctor.  Per patient, she would like physical therapy re-started and asked if I can put this request in for her.   Plan:  Home visit scheduled for 06/16/15 at 10:30am.  This social worker will also discuss physical therapy request with RNCM Landis Martins.   Alyssa Ortega [facility] Care Management [phone]

## 2015-06-16 ENCOUNTER — Encounter: Payer: Self-pay | Admitting: *Deleted

## 2015-06-16 ENCOUNTER — Other Ambulatory Visit: Payer: Self-pay | Admitting: *Deleted

## 2015-06-16 ENCOUNTER — Telehealth: Payer: Self-pay

## 2015-06-16 NOTE — Patient Outreach (Signed)
  [facility]) Care Management  [facility] Social Work  [id number]  Alyssa Ortega [id number]  Subjective:  Patient is a 82 year old female, currently lives alone and would like more help in the home.  Objective:   Encounter Medications:  Outpatient Encounter Prescriptions as of 06/16/2015  Medication Sig Note  . acetaminophen (TYLENOL) 650 MG CR tablet Take 650-1,300 mg by mouth daily as needed for pain. Reported on 04/19/2015 05/05/2015: Patient reports taking only one tablet at night  . feeding supplement, ENSURE ENLIVE, (ENSURE ENLIVE) LIQD Take 237 mLs by mouth 2 (two) times daily between meals.   . Tiotropium Bromide Monohydrate (SPIRIVA RESPIMAT) 1.25 MCG/ACT AERS Inhale 1 puff into the lungs daily.   . meloxicam (MOBIC) 7.5 MG tablet Take 1 tablet (7.5 mg total) by mouth daily. (Patient not taking: Reported on 06/16/2015)    No facility-administered encounter medications on file as of 06/16/2015.    Functional Status:  In your present state of health, do you have any difficulty performing the following activities: 06/16/2015 03/30/2015  Hearing? N N  Vision? Y Y  Difficulty concentrating or making decisions? N N  Walking or climbing stairs? Y Y  Dressing or bathing? N N  Doing errands, shopping? [name]  Preparing Food and eating ? N N  Using the Toilet? N N  In the past six months, have you accidently leaked urine? N N  Do you have problems with loss of bowel control? N N  Managing your Medications? N N  Managing your Finances? N N  Housekeeping or managing your Housekeeping? Y N    Fall/Depression Screening:  PHQ 2/9 Scores 06/09/2015 03/30/2015 02/05/2015 01/28/2015 08/05/2014  PHQ - 2 Score 0 0 0 0 0    Assessment:  Patient would like physical therapy reinstated, phone call made to [facility], spoke with Otila Kluver,  Physical therapy order requested.  Patient's support system minimal. Has 3 children, 2 sons and one daughter.   Patient may be  eligible for Medicaid and agrees to apply.  If approved, patient could then apply for personal care services. Patient not interested in moving into an Assisted Living.  Patient would like to remain in her own home. Plan:   Medicaid application to be completed during next home visit 07/01/15.    [facility] CM Care Plan Problem One        Most Recent Value   Care Plan Problem One  financial   Role Documenting the Problem One  Clinical Social Worker   Care Plan for Problem One  Active   [facility] Long Term Goal (31-90 days)  patient will complete Medicaid application within 90 days   [facility] Long Term Goal Start Date  06/16/15   Interventions for Problem One Long Term Goal  medicaid application process discussed and paperwork needed to complete the application process     [place], [place] Management [phone]

## 2015-06-16 NOTE — Telephone Encounter (Signed)
Patient requesting PT for legs and feet. Verbal order and written order done by Dr.Johnson. Faxed to [facility].

## 2015-06-29 ENCOUNTER — Encounter: Payer: Self-pay | Admitting: *Deleted

## 2015-06-29 ENCOUNTER — Other Ambulatory Visit: Payer: Self-pay | Admitting: *Deleted

## 2015-06-29 NOTE — Patient Outreach (Signed)
[facility]) Care Management   06/03/4257  Alyssa Ortega [birth year] [id number]  Alyssa Ortega is an 82 y.o. female  Subjective: I am feeling pretty fair except for the pain in my feet. Patient discussed swelling in her feet and her back pain, patient report that she takes tylenol 1 tablet at least twice daily.  Patient discussed her progression with home health physical therapy visit on yesterday, stated she was unable to walk outside at visit, just in the home. When asked patient about, what she had eaten today, reports she has only had tea and coffee cappuccino today, reports decreased appetite, but talked about food items she will cook.  .  Objective:  Blood pressure 126/72, pulse 74, resp. rate 18, SpO2 97 %. Review of Systems  Constitutional: Negative.   HENT: Negative.   Eyes: Negative.   Respiratory: Negative.   Cardiovascular:       Bilateral pedal edema right greater than  Gastrointestinal: Negative.   Genitourinary: Negative.   Musculoskeletal: Positive for back pain. Negative for falls.  Skin: Negative.   Neurological: Negative.   Endo/Heme/Allergies: Negative.   Psychiatric/Behavioral: Negative.     Physical Exam  Constitutional: She is oriented to person, place, and time. She appears well-developed and well-nourished.  Cardiovascular: Normal rate, normal heart sounds and intact distal pulses.   Respiratory: Effort normal.  GI: Soft.  Neurological: She is alert and oriented to person, place, and time.  Skin: Skin is warm and dry.  Psychiatric: She has a normal mood and affect. Her behavior is normal. Judgment and thought content normal.    Encounter Medications:   Outpatient Encounter Prescriptions as of 06/29/2015  Medication Sig Note  . acetaminophen (TYLENOL) 650 MG CR tablet Take 650-1,300 mg by mouth daily as needed for pain. Reported on 04/19/2015 05/05/2015: Patient reports taking only one tablet at night  . feeding supplement, ENSURE  ENLIVE, (ENSURE ENLIVE) LIQD Take 237 mLs by mouth 2 (two) times daily between meals.   . meloxicam (MOBIC) 7.5 MG tablet Take 1 tablet (7.5 mg total) by mouth daily. (Patient not taking: Reported on 06/16/2015)   . Tiotropium Bromide Monohydrate (SPIRIVA RESPIMAT) 1.25 MCG/ACT AERS Inhale 1 puff into the lungs daily. (Patient not taking: Reported on 06/29/2015)    No facility-administered encounter medications on file as of 06/29/2015.    Functional Status:   In your present state of health, do you have any difficulty performing the following activities: 06/29/2015 06/16/2015  Hearing? N N  Vision? Y Y  Difficulty concentrating or making decisions? N N  Walking or climbing stairs? Y Y  Dressing or bathing? N N  Doing errands, shopping? [name]  Preparing Food and eating ? N N  Using the Toilet? N N  In the past six months, have you accidently leaked urine? N N  Do you have problems with loss of bowel control? N N  Managing your Medications? N N  Managing your Finances? N N  Housekeeping or managing your Housekeeping? [name]   Fall Risk  06/16/2015 04/19/2015 04/19/2015 04/19/2015 03/30/2015  Falls in the past year? Yes - - No -  Number falls in past yr: 2 or more - - - -  Injury with Fall? Yes - - - -  Risk Factor Category  - - High Fall Risk - -  Risk for fall due to : History of fall(s);Impaired balance/gait;Impaired mobility History of fall(s);Impaired balance/gait;Impaired mobility - History of fall(s);Impaired mobility (No Data)  Risk  for fall due to (comments): - - - - Fall prevention education  Follow up Falls prevention discussed Falls prevention discussed - Falls evaluation completed;Falls prevention discussed -    Fall/Depression Screening:    PHQ 2/9 Scores 06/09/2015 03/30/2015 02/05/2015 01/28/2015 08/05/2014  PHQ - 2 Score 0 0 0 0 0    Assessment:    1. Continued back pain and right ankle discomfort, only takes tylenol twice daily, keeps feet elevated off and on during the day, and  states she keeps feet elevated at night. Patient will benefit from home health physical therapy. 2. Decreased appetite and intake- drinks at least one ensure, patient may benefit from at least 2 a day,on days that she doesn't eat , and scales to monitor weights for increased weight loss.    Plan:  RNCM will follow up on scales ordered for patient RNCM will provide ensure coupons for patient Care manager will follow up with patient in 2 weeks by phone.  THN CM Care Plan Problem Two        Most Recent Value   Care Plan Problem Two  Weight loss / Decreased appetite concern   Role Documenting the Problem Two  Care Management Coordinator   Care Plan for Problem Two  Active   Interventions for Problem Two Long Term Goal   Reinforced with patient benefits of balanced diet, protein in increasing strength, discussed healthy snack options  [goal date restarted.]   THN Long Term Goal (31-90) days  Patient will report increased intake in the 31 days   THN Long Term Goal Start Date  06/29/15 [goal date restarted]   THN CM Short Term Goal #1 (0-30 days)  Patient will began drinking ensure at least twice daily in the next 30 days   THN CM Short Term Goal #1 Start Date  06/29/15 [goal date restarted]   Interventions for Short Term Goal #2   RNCM will provide ensure coupons to patient  [goal date restarted]   THN CM Short Term Goal #2 (0-30 days)  Patient will begin to weigh at least weekly once scales have been obtained in the next 14 days.    THN CM Short Term Goal #2 Start Date  06/29/15 [goal date restarted,pt focus is her back pain]   Interventions for Short Term Goal #2  pt agreeable to weighing at least weekly, RNCM will follow up regarding scale delivery.    THN CM Care Plan Problem Three        Most Recent Value   Care Plan Problem Three  Medication Compliance   Role Documenting the Problem Three  Care Management Coordinator   Care Plan for Problem Three  Active   THN Long Term Goal (31-90) days   Patient will demonostrate improved medication compliance in the next 31 days   THN Long Term Goal Start Date  06/29/15 [goal date restart]   Interventions for Problem Three Long Term Goal  Reinforced  with patient the importance of taking all medication as prescribed,, reeducated on the purpose of each medication    THN CM Short Term Goal #1 (0-30 days)  Patient will take medications as prescribed in the next 30 days   THN CM Short Term Goal #1 Start Date  06/29/15 Barrie Folk dater restarted]   Interventions for Short Term Goal #1  Reviewed with patient importance of taking medicaiton as ordered by MD , reviewed with patient how MD as ordered for her take tylenol as needed for pain .  [Patient  reports taking tylenol at least twice daily]   THN CM Short Term Goal #2 (0-30 days)  Patient will demonstrate proper use of spriva inhaler in the next 2 weeks.   THN CM Short Term Goal #2 Start Date  04/19/15   [facility] CM Short Term Goal #2 Met Date  06/29/15 [not active, MD has emptied that form of spirvia]   THN CM Short Term Goal #3 (0-30 days)  Patient will attend PCP appointment in the next 3 days   [facility]  CM Short Term Goal #3 Start Date  06/09/15   [facility] CM Short Term Goal #3 Met Date  06/29/15     Joylene Draft, RN, [place] Management [phone]- Mobile [phone]- [place]

## 2015-07-01 ENCOUNTER — Ambulatory Visit: Payer: Self-pay | Admitting: *Deleted

## 2015-07-01 ENCOUNTER — Other Ambulatory Visit: Payer: Self-pay | Admitting: *Deleted

## 2015-07-01 NOTE — Patient Outreach (Signed)
[facility]) Care Management  [id number]  SHAELAH MEISEL [id number]   This social worker arrived at patient's home today for scheduled home visit, however patient was not at home.  This Education officer, museum called patient twice, no answer.   Plan:   This Education officer, museum will contact patient to re-schedule home visit.    Sheralyn Boatman [facility] Care Management [phone]

## 2015-07-08 ENCOUNTER — Other Ambulatory Visit: Payer: Self-pay | Admitting: *Deleted

## 2015-07-08 NOTE — Patient Outreach (Addendum)
[facility]) Care Management  [id number]  ELVY LISHMAN [id number]   Phone call to patient to re-schedule home visit that patient was a no-show for to assist with her medicaid application.   Voicemail came on but no message was able to be left.  This Education officer, museum will make attempt to contact patient within one week.    Sheralyn Boatman [facility] Care Management [phone]

## 2015-07-14 ENCOUNTER — Ambulatory Visit: Payer: Commercial Managed Care - HMO | Admitting: Family Medicine

## 2015-07-20 ENCOUNTER — Other Ambulatory Visit: Payer: Self-pay | Admitting: *Deleted
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
## 2015-07-20 NOTE — Patient Outreach (Signed)
  [facility]) Care Management  [facility] Social Work  [id number]  SWANNIE RASE [id number]  Subjective:  Patient is a 82 year old female.  Objective:   Encounter Medications:  Outpatient Encounter Prescriptions as of 07/20/2015  Medication Sig Note  . acetaminophen (TYLENOL) 650 MG CR tablet Take 650-1,300 mg by mouth daily as needed for pain. Reported on 04/19/2015 05/05/2015: Patient reports taking only one tablet at night  . feeding supplement, ENSURE ENLIVE, (ENSURE ENLIVE) LIQD Take 237 mLs by mouth 2 (two) times daily between meals.   . meloxicam (MOBIC) 7.5 MG tablet Take 1 tablet (7.5 mg total) by mouth daily. (Patient not taking: Reported on 06/16/2015)   . Tiotropium Bromide Monohydrate (SPIRIVA RESPIMAT) 1.25 MCG/ACT AERS Inhale 1 puff into the lungs daily. (Patient not taking: Reported on 06/29/2015)    No facility-administered encounter medications on file as of 07/20/2015.    Functional Status:  In your present state of health, do you have any difficulty performing the following activities: 06/29/2015 06/16/2015  Hearing? N N  Vision? Y Y  Difficulty concentrating or making decisions? N N  Walking or climbing stairs? Y Y  Dressing or bathing? N N  Doing errands, shopping? [name]  Preparing Food and eating ? N N  Using the Toilet? N N  In the past six months, have you accidently leaked urine? N N  Do you have problems with loss of bowel control? N N  Managing your Medications? N N  Managing your Finances? N N  Housekeeping or managing your Housekeeping? [name]    Fall/Depression Screening:  PHQ 2/9 Scores 06/29/2015 06/09/2015 03/30/2015 02/05/2015 01/28/2015 08/05/2014  PHQ - 2 Score 0 0 0 0 0 0    Assessment: This social worker went to visit patient in her home, however she was at a neighbors house down the street.  Permission provided to completed visit at neighbors home.  RNCM Landis Martins also present during the visit.  Patient confirms receiving home  Physical Therapy  through [facility].   It was confirmed through the Department of [place] that patient has Medicaid MQ[place].  She is not covered by full Medicaid benefits, therefore she is not eligible for personal care services covered by full Medicaid.   Patient, however  continues to be adamant that she wants to remain in her own home.  Per patient, she has a neighbor that checks in on her, as well as her daughter and family friend.  Patient's family friend confirms that she transports patient  to the doctor, the grocery store and to pay bills.  Patient describes a positive family support.  Food stamp application completed due to patient's limited income and submitted to the Department of [place].  Coupons provided for ensure.  Plan:  This Education officer, museum will follow up with patient regarding the status of her food stamp application.

## 2015-07-21 ENCOUNTER — Encounter: Payer: Self-pay | Admitting: *Deleted

## 2015-08-09 ENCOUNTER — Encounter: Payer: Self-pay | Admitting: Family Medicine

## 2015-08-09 ENCOUNTER — Ambulatory Visit (INDEPENDENT_AMBULATORY_CARE_PROVIDER_SITE_OTHER): Payer: Commercial Managed Care - HMO | Admitting: Family Medicine

## 2015-08-09 VITALS — BP 136/82 | HR 98 | Temp 98.5°F | Ht 64.0 in | Wt 127.0 lb

## 2015-08-09 DIAGNOSIS — M8448XA Pathological fracture, other site, initial encounter for fracture: Secondary | ICD-10-CM

## 2015-08-09 DIAGNOSIS — R609 Edema, unspecified: Secondary | ICD-10-CM | POA: Diagnosis not present

## 2015-08-09 DIAGNOSIS — M4856XA Collapsed vertebra, not elsewhere classified, lumbar region, initial encounter for fracture: Secondary | ICD-10-CM

## 2015-08-09 DIAGNOSIS — M4726 Other spondylosis with radiculopathy, lumbar region: Secondary | ICD-10-CM | POA: Diagnosis not present

## 2015-08-09 MED ORDER — TRAMADOL HCL 50 MG PO TABS: 50.0000 mg | ORAL_TABLET | Freq: Two times a day (BID) | ORAL | Status: DC | PRN

## 2015-08-09 NOTE — Patient Instructions (Signed)
[facility] Appointment June 20 1PM

## 2015-08-09 NOTE — Assessment & Plan Note (Signed)
Referral to orthopedics made again today. She will keep the appointment.

## 2015-08-09 NOTE — Progress Notes (Signed)
BP 136/82 mmHg  Pulse 98  Temp(Src) 98.5 F (36.9 C)  Ht 5\' 4"  (1.626 m)  Wt 127 lb (57.607 kg)  BMI 21.79 kg/m2  SpO2 96%  LMP  (LMP Unknown)   Subjective:    Patient ID: Alyssa Ortega, female    DOB: 10-09-1930, 82 y.o.   MRN: [id number]  HPI: Alyssa Ortega is a 82 y.o. female  Chief Complaint  Patient presents with  . Foot Swelling   Alyssa Ortega is here today to check in and see how she is doing. Has been seeing social worker regularly to try to help with her malnutrition and keep her home. She was supposed to see orthopedics for her compression fracture, but she no-showed that appointment. She states that she feels like the social workers are intrusive. She notes that her back and legs are hurting a whole lot. She has been continuing to have her legs swell. Keeps them down and is on them most of the day. Hasn't worn compression stockings because she doesn't like them. Eating well. She is otherwise doing well with no other concerns at this time.   Relevant past medical, surgical, family and social history reviewed and updated as indicated. Interim medical history since our last visit reviewed. Allergies and medications reviewed and updated.  Review of Systems  Constitutional: Negative.   Respiratory: Negative.   Cardiovascular: Positive for leg swelling. Negative for chest pain and palpitations.  Musculoskeletal: Positive for myalgias, back pain, joint swelling, arthralgias and gait problem. Negative for neck pain and neck stiffness.  Skin: Negative.   Psychiatric/Behavioral: Negative.     Per HPI unless specifically indicated above     Objective:    BP 136/82 mmHg  Pulse 98  Temp(Src) 98.5 F (36.9 C)  Ht 5\' 4"  (1.626 m)  Wt 127 lb (57.607 kg)  BMI 21.79 kg/m2  SpO2 96%  LMP  (LMP Unknown)  Wt Readings from Last 3 Encounters:  08/09/15 127 lb (57.607 kg)  07/20/15 132 lb (59.875 kg)  06/11/15 130 lb (58.968 kg)    Physical Exam  Constitutional: She is oriented to  person, place, and time. She appears well-developed and well-nourished. No distress.  HENT:  Head: Normocephalic and atraumatic.  Right Ear: Hearing normal.  Left Ear: Hearing normal.  Nose: Nose normal.  Eyes: Conjunctivae and lids are normal. Right eye exhibits no discharge. Left eye exhibits no discharge. No scleral icterus.  Cardiovascular: Normal rate, regular rhythm, normal heart sounds and intact distal pulses.  Exam reveals no gallop and no friction rub.   Pulmonary/Chest: Effort normal and breath sounds normal. No respiratory distress. She has no wheezes. She has no rales. She exhibits no tenderness.  Musculoskeletal: She exhibits edema.  Kyphotic, tender to light palpation of her back and legs. Trace edema bilaterally  Neurological: She is alert and oriented to person, place, and time.  Skin: Skin is warm, dry and intact. No rash noted. No erythema. No pallor.  Psychiatric: She has a normal mood and affect. Her speech is normal and behavior is normal. Judgment and thought content normal. Cognition and memory are normal.  Nursing note and vitals reviewed.   Results for orders placed or performed during the hospital encounter of 05/16/15  CBC with Differential  Result Value Ref Range   WBC 6.3 3.6 - 11.0 K/uL   RBC 4.21 3.80 - 5.20 MIL/uL   Hemoglobin 13.3 12.0 - 16.0 g/dL   HCT 39.2 35.0 - 47.0 %   MCV 93.1  80.0 - 100.0 fL   MCH 31.5 26.0 - 34.0 pg   MCHC 33.9 32.0 - 36.0 g/dL   RDW 15.4 (H) 11.5 - 14.5 %   Platelets 139 (L) 150 - 440 K/uL   Neutrophils Relative % 65 %   Neutro Abs 4.1 1.4 - 6.5 K/uL   Lymphocytes Relative 25 %   Lymphs Abs 1.6 1.0 - 3.6 K/uL   Monocytes Relative 8 %   Monocytes Absolute 0.5 0.2 - 0.9 K/uL   Eosinophils Relative 1 %   Eosinophils Absolute 0.1 0 - 0.7 K/uL   Basophils Relative 1 %   Basophils Absolute 0.0 0 - 0.1 K/uL  Comprehensive metabolic panel  Result Value Ref Range   Sodium 130 (L) 135 - 145 mmol/L   Potassium 3.5 3.5 - 5.1  mmol/L   Chloride 94 (L) 101 - 111 mmol/L   CO2 28 22 - 32 mmol/L   Glucose, Bld 91 65 - 99 mg/dL   BUN 5 (L) 6 - 20 mg/dL   Creatinine, Ser 0.48 0.44 - 1.00 mg/dL   Calcium 8.5 (L) 8.9 - 10.3 mg/dL   Total Protein 6.7 6.5 - 8.1 g/dL   Albumin 3.7 3.5 - 5.0 g/dL   AST 25 15 - 41 U/L   ALT 14 14 - 54 U/L   Alkaline Phosphatase 116 38 - 126 U/L   Total Bilirubin 0.4 0.3 - 1.2 mg/dL   GFR calc non Af Amer >60 >60 mL/min   GFR calc Af Amer >60 >60 mL/min   Anion gap 8 5 - 15  Lipase, blood  Result Value Ref Range   Lipase 50 11 - 51 U/L  Urinalysis complete, with microscopic ([facility] only)  Result Value Ref Range   Color, Urine STRAW (A) YELLOW   APPearance CLEAR (A) CLEAR   Glucose, UA NEGATIVE NEGATIVE mg/dL   Bilirubin Urine NEGATIVE NEGATIVE   Ketones, ur NEGATIVE NEGATIVE mg/dL   Specific Gravity, Urine 1.002 (L) 1.005 - 1.030   Hgb urine dipstick NEGATIVE NEGATIVE   pH 6.0 5.0 - 8.0   Protein, ur NEGATIVE NEGATIVE mg/dL   Nitrite NEGATIVE NEGATIVE   Leukocytes, UA NEGATIVE NEGATIVE   RBC / HPF 0-5 0 - 5 RBC/hpf   WBC, UA 0-5 0 - 5 WBC/hpf   Bacteria, UA NONE SEEN NONE SEEN   Squamous Epithelial / LPF 0-5 (A) NONE SEEN      Assessment & Plan:   Problem List Items Addressed This Visit      Musculoskeletal and Integument   Compression fracture of lumbar spine, non-traumatic    Referral to orthopedics made again today. She will keep the appointment.        Other Visit Diagnoses    Peripheral edema    -  Primary    Due to keeping her legs dependent. Rx for compression stockings given today. Continue to elevate.    Osteoarthritis of spine with radiculopathy, lumbar region        Referral to orthopedics made again today. she will go. Rx for tramadol BID given. Continue PT. Continue to monitor.     Relevant Medications    traMADol (ULTRAM) 50 MG tablet        Follow up plan: Return 4-6 weeks.

## 2015-08-16 ENCOUNTER — Telehealth: Payer: Self-pay | Admitting: Family Medicine

## 2015-08-16 NOTE — Telephone Encounter (Signed)
Verbal order given  

## 2015-08-16 NOTE — Telephone Encounter (Signed)
[facility] from [facility] called and would like to get a verbal order for a Social worker consult.

## 2015-08-17 ENCOUNTER — Emergency Department: Payer: Commercial Managed Care - HMO

## 2015-08-17 ENCOUNTER — Inpatient Hospital Stay
Admission: EM | Admit: 2015-08-17 | Discharge: 2015-08-18 | DRG: 641 | Disposition: A | Discharge status: Skilled Nursing Facility | Payer: Commercial Managed Care - HMO | Attending: Internal Medicine | Admitting: Internal Medicine

## 2015-08-17 DIAGNOSIS — Y901 Blood alcohol level of 20-39 mg/100 ml: Secondary | ICD-10-CM | POA: Diagnosis present

## 2015-08-17 DIAGNOSIS — Z90710 Acquired absence of both cervix and uterus: Secondary | ICD-10-CM

## 2015-08-17 DIAGNOSIS — E860 Dehydration: Secondary | ICD-10-CM | POA: Diagnosis present

## 2015-08-17 DIAGNOSIS — E871 Hypo-osmolality and hyponatremia: Principal | ICD-10-CM | POA: Diagnosis present

## 2015-08-17 DIAGNOSIS — Z96642 Presence of left artificial hip joint: Secondary | ICD-10-CM | POA: Diagnosis present

## 2015-08-17 DIAGNOSIS — E785 Hyperlipidemia, unspecified: Secondary | ICD-10-CM | POA: Diagnosis present

## 2015-08-17 DIAGNOSIS — R262 Difficulty in walking, not elsewhere classified: Secondary | ICD-10-CM | POA: Diagnosis present

## 2015-08-17 DIAGNOSIS — W19XXXA Unspecified fall, initial encounter: Secondary | ICD-10-CM | POA: Diagnosis present

## 2015-08-17 DIAGNOSIS — R627 Adult failure to thrive: Secondary | ICD-10-CM | POA: Diagnosis present

## 2015-08-17 DIAGNOSIS — Z8261 Family history of arthritis: Secondary | ICD-10-CM | POA: Diagnosis not present

## 2015-08-17 DIAGNOSIS — Z87891 Personal history of nicotine dependence: Secondary | ICD-10-CM

## 2015-08-17 DIAGNOSIS — Z9889 Other specified postprocedural states: Secondary | ICD-10-CM | POA: Diagnosis not present

## 2015-08-17 DIAGNOSIS — Z8711 Personal history of peptic ulcer disease: Secondary | ICD-10-CM

## 2015-08-17 DIAGNOSIS — I10 Essential (primary) hypertension: Secondary | ICD-10-CM | POA: Diagnosis present

## 2015-08-17 DIAGNOSIS — F10129 Alcohol abuse with intoxication, unspecified: Secondary | ICD-10-CM | POA: Diagnosis present

## 2015-08-17 DIAGNOSIS — R531 Weakness: Secondary | ICD-10-CM

## 2015-08-17 DIAGNOSIS — E876 Hypokalemia: Secondary | ICD-10-CM | POA: Diagnosis present

## 2015-08-17 LAB — URINALYSIS COMPLETE WITH MICROSCOPIC (ARMC ONLY)
BILIRUBIN URINE: NEGATIVE
Bacteria, UA: NONE SEEN
Glucose, UA: NEGATIVE mg/dL
Hgb urine dipstick: NEGATIVE
LEUKOCYTES UA: NEGATIVE
Nitrite: NEGATIVE
PH: 6 (ref 5.0–8.0)
PROTEIN: NEGATIVE mg/dL
SPECIFIC GRAVITY, URINE: 1.003 — AB (ref 1.005–1.030)
SQUAMOUS EPITHELIAL / LPF: NONE SEEN

## 2015-08-17 LAB — BASIC METABOLIC PANEL
ANION GAP: 10 (ref 5–15)
Anion gap: 12 (ref 5–15)
CALCIUM: 8.5 mg/dL — AB (ref 8.9–10.3)
CHLORIDE: 90 mmol/L — AB (ref 101–111)
CO2: 24 mmol/L (ref 22–32)
CO2: 25 mmol/L (ref 22–32)
CREATININE: 0.46 mg/dL (ref 0.44–1.00)
Calcium: 8.4 mg/dL — ABNORMAL LOW (ref 8.9–10.3)
Chloride: 86 mmol/L — ABNORMAL LOW (ref 101–111)
Creatinine, Ser: 0.39 mg/dL — ABNORMAL LOW (ref 0.44–1.00)
GFR calc Af Amer: >60 mL/min (ref >60)
GFR calc Af Amer: >60 mL/min (ref >60)
GLUCOSE: 83 mg/dL (ref 65–99)
Glucose, Bld: 77 mg/dL (ref 65–99)
POTASSIUM: 3.7 mmol/L (ref 3.5–5.1)
Potassium: 4.2 mmol/L (ref 3.5–5.1)
SODIUM: 125 mmol/L — AB (ref 135–145)
Sodium: 122 mmol/L — ABNORMAL LOW (ref 135–145)

## 2015-08-17 LAB — CBC WITH DIFFERENTIAL/PLATELET
BASOS ABS: 0 10*3/uL (ref 0–0.1)
Basophils Relative: 0 %
EOS PCT: 0 %
Eosinophils Absolute: 0 10*3/uL (ref 0–0.7)
HEMATOCRIT: 39.8 % (ref 35.0–47.0)
Hemoglobin: 14 g/dL (ref 12.0–16.0)
LYMPHS PCT: 19 %
Lymphs Abs: 1.7 10*3/uL (ref 1.0–3.6)
MCH: 32.2 pg (ref 26.0–34.0)
MCHC: 35.1 g/dL (ref 32.0–36.0)
MCV: 91.7 fL (ref 80.0–100.0)
MONO ABS: 0.7 10*3/uL (ref 0.2–0.9)
MONOS PCT: 8 %
NEUTROS ABS: 6.5 10*3/uL (ref 1.4–6.5)
Neutrophils Relative %: 73 %
PLATELETS: 129 10*3/uL — AB (ref 150–440)
RBC: 4.34 MIL/uL (ref 3.80–5.20)
RDW: 15.3 % — AB (ref 11.5–14.5)
WBC: 9 10*3/uL (ref 3.6–11.0)

## 2015-08-17 LAB — ETHANOL: Alcohol, Ethyl (B): 34 mg/dL — ABNORMAL HIGH (ref <5)

## 2015-08-17 LAB — TROPONIN I: Troponin I: <0.03 ng/mL (ref <0.031)

## 2015-08-17 MED ORDER — ENSURE ENLIVE PO LIQD
237.0000 mL | Freq: Two times a day (BID) | ORAL | Status: DC
  Administered 2015-08-18: 237 mL via ORAL

## 2015-08-17 MED ORDER — SALINE SPRAY 0.65 % NA SOLN
1.0000 | NASAL | Status: DC | PRN
  Filled 2015-08-17: qty 44

## 2015-08-17 MED ORDER — ACETAMINOPHEN 500 MG PO TABS
1000.0000 mg | ORAL_TABLET | Freq: Once | ORAL | Status: AC
  Administered 2015-08-17: 1000 mg via ORAL
  Filled 2015-08-17: qty 2

## 2015-08-17 MED ORDER — SODIUM CHLORIDE 0.9 % IV SOLN
INTRAVENOUS | Status: DC
  Administered 2015-08-17 (×2): via INTRAVENOUS

## 2015-08-17 MED ORDER — FOLIC ACID 1 MG PO TABS
1.0000 mg | ORAL_TABLET | Freq: Every day | ORAL | Status: DC
  Administered 2015-08-17 – 2015-08-18 (×2): 1 mg via ORAL
  Filled 2015-08-17 (×2): qty 1

## 2015-08-17 MED ORDER — METOPROLOL TARTRATE 25 MG PO TABS
25.0000 mg | ORAL_TABLET | Freq: Two times a day (BID) | ORAL | Status: DC
  Administered 2015-08-17 – 2015-08-18 (×2): 25 mg via ORAL
  Filled 2015-08-17 (×3): qty 1

## 2015-08-17 MED ORDER — SODIUM CHLORIDE 0.9 % IV BOLUS (SEPSIS)
1000.0000 mL | Freq: Once | INTRAVENOUS | Status: AC
  Administered 2015-08-17: 1000 mL via INTRAVENOUS

## 2015-08-17 MED ORDER — LORAZEPAM 2 MG PO TABS
0.0000 mg | ORAL_TABLET | Freq: Four times a day (QID) | ORAL | Status: DC
  Administered 2015-08-17 – 2015-08-18 (×2): 2 mg via ORAL
  Filled 2015-08-17 (×2): qty 1

## 2015-08-17 MED ORDER — ENOXAPARIN SODIUM 40 MG/0.4ML ~~LOC~~ SOLN
40.0000 mg | SUBCUTANEOUS | Status: DC
  Administered 2015-08-17: 40 mg via SUBCUTANEOUS
  Filled 2015-08-17: qty 0.4

## 2015-08-17 MED ORDER — VITAMIN B-1 100 MG PO TABS
100.0000 mg | ORAL_TABLET | Freq: Every day | ORAL | Status: DC
Start: 2015-08-17 — End: 2015-08-18
  Administered 2015-08-17 – 2015-08-18 (×2): 100 mg via ORAL
  Filled 2015-08-17 (×2): qty 1

## 2015-08-17 NOTE — Progress Notes (Signed)
[facility] [place] NAME: Alyssa Ortega    MR#:  [id number]  DATE OF BIRTH:  04-18-1930  SUBJECTIVE:  CHIEF COMPLAINT:   Chief Complaint  Patient presents with  . Fall   -Patient admitted with hyponatremia and alcohol intoxication. -Received Ativan prior to my visit and so very sleepy. Arousable and pleasant. -Retaining urine so straight catheter done in and out and about 800 cc urine drained  REVIEW OF SYSTEMS:  Review of Systems  Unable to perform ROS: medical condition    DRUG ALLERGIES:   Allergies  Allergen Reactions  . Aspirin Hives and Other (See Comments)    Nosebleed per patient    VITALS:  Blood pressure 133/76, pulse 87, temperature 98 F (36.7 C), temperature source Oral, resp. rate 18, height 5\' 4"  (1.626 m), weight 57.425 kg (126 lb 9.6 oz), SpO2 97 %.  PHYSICAL EXAMINATION:  Physical Exam  GENERAL:  82 y.o.-year-old patient lying in the bed with no acute distress.  EYES: Pupils equal, round, reactive to light and accommodation. No scleral icterus. Extraocular muscles intact.  HEENT: Head atraumatic, normocephalic. Oropharynx and nasopharynx clear. Patient does not have any teeth NECK:  Supple, no jugular venous distention. No thyroid enlargement, no tenderness.  LUNGS: Normal breath sounds bilaterally, no wheezing, rales,rhonchi or crepitation. No use of accessory muscles of respiration.  CARDIOVASCULAR: S1, S2 normal. No murmurs, rubs, or gallops.  ABDOMEN: Soft, nontender, nondistended. Bowel sounds present. No organomegaly or mass.  EXTREMITIES: No pedal edema, cyanosis, or clubbing.  NEUROLOGIC: No obvious facial droop or cranial nerve deficits noted. Patient is arousable and following commands. Currently sedated. No focal neurological deficits identified. Gait not checked.  PSYCHIATRIC: The patient is sleepy but easily arousable at this time. Sedated.Marland Kitchen  SKIN: No obvious rash, lesion, or ulcer.     LABORATORY PANEL:   CBC  Recent Labs Lab 08/17/15 0500  WBC 9.0  HGB 14.0  HCT 39.8  PLT 129*   ------------------------------------------------------------------------------------------------------------------  Chemistries   Recent Labs Lab 08/17/15 0500  NA 122*  K 4.2  CL 86*  CO2 24  GLUCOSE 83  BUN <5*  CREATININE 0.46  CALCIUM 8.5*   ------------------------------------------------------------------------------------------------------------------  Cardiac Enzymes  Recent Labs Lab 08/17/15 0500  TROPONINI <0.03   ------------------------------------------------------------------------------------------------------------------  RADIOLOGY:  Dg Lumbar Spine Complete  08/17/2015  CLINICAL DATA:  82 year old female with fall and bilateral hip pain. EXAM: LUMBAR SPINE - COMPLETE 4+ VIEW; PELVIS - 1-2 VIEW COMPARISON:  CT of the abdomen pelvis dated 05/16/2015 FINDINGS: Evaluation for acute fracture is very limited due to advanced osteopenia and multilevel degenerative changes of the spine. There are multilevel compression deformity involving L2-L5 with vertebroplasty changes similar to the prior CT. Age indeterminate compression deformity of the T11 vertebra noted. There are bilateral hip arthroplasties. There is no definite acute fracture of the pelvis. No dislocation. IMPRESSION: No acute fracture or dislocation of the pelvis or hips. Bilateral hip arthroplasties. Advanced osteopenia with multilevel degenerative changes and compression deformity and vertebroplasty of lumbar spine. Electronically Signed   By: Anner Crete M.D.   On: 08/17/2015 02:28   Dg Pelvis 1-2 Views  08/17/2015  CLINICAL DATA:  82 year old female with fall and bilateral hip pain. EXAM: LUMBAR SPINE - COMPLETE 4+ VIEW; PELVIS - 1-2 VIEW COMPARISON:  CT of the abdomen pelvis dated 05/16/2015 FINDINGS: Evaluation for acute fracture is very limited due to advanced osteopenia and multilevel  degenerative changes of the spine. There are  multilevel compression deformity involving L2-L5 with vertebroplasty changes similar to the prior CT. Age indeterminate compression deformity of the T11 vertebra noted. There are bilateral hip arthroplasties. There is no definite acute fracture of the pelvis. No dislocation. IMPRESSION: No acute fracture or dislocation of the pelvis or hips. Bilateral hip arthroplasties. Advanced osteopenia with multilevel degenerative changes and compression deformity and vertebroplasty of lumbar spine. Electronically Signed   By: Anner Crete M.D.   On: 08/17/2015 02:28   Ct Pelvis Wo Contrast  08/17/2015  CLINICAL DATA:  82 year old female with fall and bilateral hip pain. Evaluate for occult fracture. EXAM: CT PELVIS WITHOUT CONTRAST TECHNIQUE: Multidetector CT imaging of the pelvis was performed following the standard protocol without intravenous contrast. COMPARISON:  Pelvic radiograph dated 08/27/2015 and abdominal CT dated 05/16/2015 FINDINGS: Evaluation of this exam is limited in the absence of intravenous contrast. Evaluation is also limited due to streak artifact caused by bilateral hip arthroplasties. The urinary bladder is distended. No dilated bowel identified within the pelvis. There is scattered colonic diverticula. There is aortoiliac atherosclerotic disease. There is ectasia of the distal aorta. There is advanced osteopenia. No acute fracture or dislocation identified. There is stable appearing area of irregularity in involving the S3 sacral bone. Stable appearing compression deformity of the L4 and L5 with vertebroplasty changes. IMPRESSION: Advanced osteopenia.  No definite acute fracture or dislocation. Electronically Signed   By: Anner Crete M.D.   On: 08/17/2015 05:32    EKG:   Orders placed or performed during the hospital encounter of 03/15/15  . ED EKG  . ED EKG    ASSESSMENT AND PLAN:   82 year old female with past medical history  significant for hypertension, hyperlipidemia and peptic ulcer disease presents to the hospital secondary to mechanical fall. Noted to have low sodium.  #1 hyponatremia-beer potomania and also dehydration -Continue IV normal saline. Recheck sodium every 8 hours  #2 alcohol abuse-placed on CIWA protocol. Also on thiamine and folic acid supplements.  #3 generalized deconditioning-will need physical therapy consult prior to discharge.  #4 hypertension-metoprolol started.  #5 DVT prophylaxis-on Lovenox   All the records are reviewed and case discussed with Care Management/Social Workerr. Management plans discussed with the patient, family and they are in agreement.  CODE STATUS: Full code  TOTAL TIME TAKING CARE OF THIS PATIENT: 36 minutes.   POSSIBLE D/C IN 1-2 DAYS, DEPENDING ON CLINICAL CONDITION.   Rosezella Kronick M.D on 08/17/2015 at 11:34 AM  Between 7am to 6pm - Pager - [phone]  After 6pm go to [email or web address] - password [email or web address] [facility]  Office  [phone]  CC: Primary care physician; Park Liter, DO

## 2015-08-17 NOTE — ED Notes (Signed)
Per patient's son in law, pt drinks regularly at home and he feels this is possible cause for frequent falls.  EDP notified of son-in-law's concern.

## 2015-08-17 NOTE — Progress Notes (Signed)
No floor mats on floor. Placed called to orderly awaiting results.Bed placed in lowest position. Call bell within reach. IV wrapped.

## 2015-08-17 NOTE — Progress Notes (Signed)
Patient is requesting to void frequently. Bladder scanned after void. Void 100 mL and Bladder scanned at 825 mL. Dr. Tressia Miners notified and ordered patient to be in and out cath.

## 2015-08-17 NOTE — ED Notes (Signed)
Pt removed from bed pan and linens changed prior to being taken to floor.

## 2015-08-17 NOTE — ED Notes (Signed)
Pt from home alone.  Pt states she was trying to get to her chair from her bed to watch wrestling.  Pt states she did not lose consciousness.  EMS reports patient ambulates with assistance.  Pt alert and oriented x4.

## 2015-08-17 NOTE — Clinical Social Work Placement (Signed)
   CLINICAL SOCIAL WORK PLACEMENT  NOTE  Date:  08/17/2015  Patient Details  Name: Alyssa Ortega MRN: [id number] Date of Birth: [birth year]  Clinical Social Work is seeking post-discharge placement for this patient at the [place] level of care (*CSW will initial, date and re-position this form in  chart as items are completed):  Yes   Patient/family provided with [facility] Work Department's list of facilities offering this level of care within the geographic area requested by the patient (or if unable, by the patient's family).  Yes   Patient/family informed of their freedom to choose among providers that offer the needed level of care, that participate in Medicare, Medicaid or managed care program needed by the patient, have an available bed and are willing to accept the patient.  Yes   Patient/family informed of [facility]'s ownership interest in [facility] and [facility], as well as of the fact that they are under no obligation to receive care at these facilities.  PASRR submitted to EDS on       PASRR number received on       Existing PASRR number confirmed on 08/17/15     FL2 transmitted to all facilities in geographic area requested by pt/family on 08/17/15     FL2 transmitted to all facilities within larger geographic area on       Patient informed that his/her managed care company has contracts with or will negotiate with certain facilities, including the following:            Patient/family informed of bed offers received.  Patient chooses bed at       Physician recommends and patient chooses bed at      Patient to be transferred to   on  .  Patient to be transferred to facility by       Patient family notified on   of transfer.  Name of family member notified:        PHYSICIAN       Additional Comment:    _______________________________________________ Loralyn Freshwater, LCSW 08/17/2015, 2:28 PM

## 2015-08-17 NOTE — ED Provider Notes (Signed)
[facility] Emergency Department Provider Note   ____________________________________________  Time seen: Approximately 1:04 AM  I have reviewed the triage vital signs and the nursing notes.   HISTORY  Chief Complaint Fall    HPI Alyssa Ortega is a 82 y.o. female who presents to the ED from home via EMS with a chief complaint of fall. Patient had a mechanical fall while trying to get from her bed to her chair, losing her balance. Did not strike her head or suffer LOC. Complains of bilateral hip and back pain. Patient does report chronic hip and back pain; however, complains of more pain after her recent fall. Denies recent fever, chills, chest pain, shortness of breath, abdominal pain, nausea, vomiting, diarrhea. Denies recent travel. Nothing makes her pain better. Movement makes her pain worse.   Past Medical History  Diagnosis Date  . Hyperlipidemia   . Hypertension   . GI bleed 10/2007    secondary to duodenal ulcers  . History of epistaxis     had MRI by Dr. Carlis Abbott  . Hip fracture ([place]) 3/09    left, s/p hemiarthroplasty  . Rib fractures     traumatic  . Insomnia   . Osteoporosis   . Bursitis   . Dehydration   . Gastritis     Patient Active Problem List   Diagnosis Date Noted  . Compression fracture of lumbar spine, non-traumatic 05/18/2015  . Adult failure to thrive 05/18/2015  . COPD (chronic obstructive pulmonary disease) ([place]) 04/15/2015  . Malnutrition ([place]) 03/18/2015  . Trochanteric bursitis of right hip 10/20/2014  . Benign essential HTN 08/05/2014  . Hypercholesterolemia without hypertriglyceridemia 08/05/2014  . Cellulitis and abscess of leg 07/01/2014  . Wedge fracture of lumbar vertebra ([place]) 09/30/2013  . Screening for breast cancer 12/22/2010  . Alcohol abuse, episodic 12/22/2010  . Weight loss, non-intentional 12/22/2010  . Tinnitus of left ear 12/22/2010  . Thrombocytopenia ([place]) 12/22/2010  . Osteoporosis with  fracture   . Rib fractures   . History of epistaxis   . GI bleed 10/29/2007    Past Surgical History  Procedure Laterality Date  . Hemiarthroplasty hip  3/09, 10/11    left hip  . Joint replacement  2009    Left hip  . Abdominal hysterectomy    . Back surgery      Current Outpatient Rx  Name  Route  Sig  Dispense  Refill  . acetaminophen (TYLENOL) 650 MG CR tablet   Oral   Take 650-1,300 mg by mouth daily as needed for pain. Reported on 04/19/2015         . feeding supplement, ENSURE ENLIVE, (ENSURE ENLIVE) LIQD   Oral   Take 237 mLs by mouth 2 (two) times daily between meals.   237 mL   12   . Tiotropium Bromide Monohydrate (SPIRIVA RESPIMAT) 1.25 MCG/ACT AERS   Inhalation   Inhale 1 puff into the lungs daily. Patient not taking: Reported on 06/29/2015   4 g   12   . traMADol (ULTRAM) 50 MG tablet   Oral   Take 1 tablet (50 mg total) by mouth every 12 (twelve) hours as needed.   60 tablet   1     Allergies Aspirin  Family History  Problem Relation Age of Onset  . Cancer Son 40    colon  . Arthritis Brother     Social History Social History  Substance Use Topics  . Smoking status: Former Smoker  Types: Cigarettes    Quit date: 12/21/1990  . Smokeless tobacco: Never Used  . Alcohol Use: Yes     Comment: occasional    Review of Systems  Constitutional: No fever/chills. Eyes: No visual changes. ENT: No sore throat. Cardiovascular: Denies chest pain. Respiratory: Denies shortness of breath. Gastrointestinal: No abdominal pain.  No nausea, no vomiting.  No diarrhea.  No constipation. Genitourinary: Negative for dysuria. Musculoskeletal: Positive for back pain and hip pain. Skin: Negative for rash. Neurological: Negative for headaches, focal weakness or numbness.  10-point ROS otherwise negative.  ____________________________________________   PHYSICAL EXAM:  VITAL SIGNS: ED Triage Vitals  Enc Vitals Group     BP --      Pulse --       Resp --      Temp --      Temp src --      SpO2 --      Weight --      Height --      Head Cir --      Peak Flow --      Pain Score 08/17/15 0049 10     Pain Loc --      Pain Edu? --      Excl. in [place]? --     Constitutional: Alert and oriented. Well appearing and in no acute distress. Eyes: Conjunctivae are normal. PERRL. EOMI. Head: Atraumatic. Nose: No congestion/rhinnorhea. Mouth/Throat: Mucous membranes are moist.  Oropharynx non-erythematous.  Edentulous. Neck: No stridor.  No cervical spine tenderness to palpation. Cardiovascular: Normal rate, regular rhythm. Grossly normal heart sounds.  Good peripheral circulation. Respiratory: Normal respiratory effort.  No retractions. Lungs CTAB. Gastrointestinal: Soft and nontender. No distention. No abdominal bruits. No CVA tenderness. Musculoskeletal: Bilateral hips tender to palpation. There is no shortening nor rotation. Full range of motion bilateral hips with some pain. Midline lumbar spine tenderness to palpation. No step-offs or deformities noted. No lower extremity tenderness nor edema.  No joint effusions. Neurologic:  Normal speech and language. No gross focal neurologic deficits are appreciated.  Skin:  Skin is warm, dry and intact. No rash noted. Psychiatric: Mood and affect are normal. Speech and behavior are normal.  ____________________________________________   LABS (all labs ordered are listed, but only abnormal results are displayed)  Labs Reviewed  URINALYSIS COMPLETEWITH MICROSCOPIC ([place]) - Abnormal; Notable for the following:    Color, Urine STRAW (*)    APPearance CLEAR (*)    Ketones, ur TRACE (*)    Specific Gravity, Urine 1.003 (*)    All other components within normal limits  CBC WITH DIFFERENTIAL/PLATELET - Abnormal; Notable for the following:    RDW 15.3 (*)    Platelets 129 (*)    All other components within normal limits  BASIC METABOLIC PANEL - Abnormal; Notable for the following:     Sodium 122 (*)    Chloride 86 (*)    BUN <5 (*)    Calcium 8.5 (*)    All other components within normal limits  TROPONIN I  ETHANOL   ____________________________________________  EKG  ED ECG REPORT I, SUNG,JADE J, the attending physician, personally viewed and interpreted this ECG.   Date: 08/17/2015  EKG Time: 0052  Rate: 93  Rhythm: normal EKG, normal sinus rhythm  Axis: LAD  Intervals:none  ST&T Change: Nonspecific  ____________________________________________  RADIOLOGY  Pelvis x-rays (reviewed by me, interpreted per Dr. Quintella Reichert): No acute fracture or dislocation of the pelvis or hips. Bilateral hip arthroplasties.  Advanced osteopenia with multilevel degenerative changes and compression deformity and vertebroplasty of lumbar spine.  Lumbar spine x-rays (viewed by me, interpreted per Dr. Quintella Reichert): No acute fracture or dislocation of the pelvis or hips. Bilateral hip arthroplasties.  Advanced osteopenia with multilevel degenerative changes and compression deformity and vertebroplasty of lumbar spine.  CT pelvis without contrast interpreted per Dr. Quintella Reichert: Advanced osteopenia. No definite acute fracture or dislocation. ____________________________________________   PROCEDURES  Procedure(s) performed: None  Critical Care performed: No  ____________________________________________   INITIAL IMPRESSION / ASSESSMENT AND PLAN / ED COURSE  Pertinent labs & imaging results that were available during my care of the patient were reviewed by me and considered in my medical decision making (see chart for details).  82 year old female who presents s/p mechanical fall from home with bilateral hip and back pain. Will obtain x-ray imaging studies. Will also obtain urinalysis.  ----------------------------------------- 4:54 AM on 08/17/2015 -----------------------------------------  Attempted to ambulate patient. RN reports patient was generally weak and  required heavy assistance. Patient lives independently and states her walker is too wide for parts of her domicile. Will obtain basic blood work, CT pelvis to evaluate for occult fractures. If those are negative then we will obtain social work and physical therapy consults.  ----------------------------------------- 6:03 AM on 08/17/2015 -----------------------------------------  Sodium is 122. Per son-in-law, patient is a chronic alcoholic. Will add EtOH 2 labs already drawn. Normal saline bolus ordered. Discussed with hospitalist to evaluate patient in the emergency department for admission. ____________________________________________   FINAL CLINICAL IMPRESSION(S) / ED DIAGNOSES  Final diagnoses:  Fall, initial encounter  Adult failure to thrive  Generalized weakness  Hyponatremia      NEW MEDICATIONS STARTED DURING THIS VISIT:  New Prescriptions   No medications on file     Note:  This document was prepared using Dragon voice recognition software and may include unintentional dictation errors.    Paulette Blanch, MD 08/17/15 [phone]

## 2015-08-17 NOTE — Progress Notes (Signed)
Clinical Education officer, museum (CSW) received consult for home health VS. Placement. PT consult is pending. Once PT evaluates CSW will proceed with appropriate D/C plan.    Blima Rich, LCSW [phone]

## 2015-08-17 NOTE — ED Notes (Signed)
Got patient up to commode, pt weak and unable to walk without assistance of this RN and Beth V, [place] notified.  Pt states that she's not sure it is safe for her to go home by herself.  EDP placed orders for social work consult and PT consult.

## 2015-08-17 NOTE — Care Management (Signed)
[facility] is [facility] to [facility] for [facility] health [facility].

## 2015-08-17 NOTE — H&P (Signed)
Alyssa Ortega is an 82 y.o. female.   Chief Complaint: Fall HPI: Patient fell while trying to get up from a chair while she was watching wrestling. She has been drinking beer most of the day. Work up in ED showed no broken bones. Patient says she has had increasing difficulty walking lately. Was found to have low sodium.  Past Medical History  Diagnosis Date  . Hyperlipidemia   . Hypertension   . GI bleed 10/2007    secondary to duodenal ulcers  . History of epistaxis     had MRI by Dr. Carlis Abbott  . Hip fracture ([place]) 3/09    left, s/p hemiarthroplasty  . Rib fractures     traumatic  . Insomnia   . Osteoporosis   . Bursitis   . Dehydration   . Gastritis     Past Surgical History  Procedure Laterality Date  . Hemiarthroplasty hip  3/09, 10/11    left hip  . Joint replacement  2009    Left hip  . Abdominal hysterectomy    . Back surgery      Family History  Problem Relation Age of Onset  . Cancer Son 40    colon  . Arthritis Brother    Social History:  reports that she quit smoking about 24 years ago. Her smoking use included Cigarettes. She has never used smokeless tobacco. She reports that she drinks alcohol. She reports that she does not use illicit drugs.  Allergies:  Allergies  Allergen Reactions  . Aspirin Hives and Other (See Comments)    Nosebleed per patient     (Not in a hospital admission)  Results for orders placed or performed during the hospital encounter of 08/17/15 (from the past 48 hour(s))  Urinalysis complete, with microscopic ([facility] only)     Status: Abnormal   Collection Time: 08/17/15  3:00 AM  Result Value Ref Range   Color, Urine STRAW (A) YELLOW   APPearance CLEAR (A) CLEAR   Glucose, UA NEGATIVE NEGATIVE mg/dL   Bilirubin Urine NEGATIVE NEGATIVE   Ketones, ur TRACE (A) NEGATIVE mg/dL   Specific Gravity, Urine 1.003 (L) 1.005 - 1.030   Hgb urine dipstick NEGATIVE NEGATIVE   pH 6.0 5.0 - 8.0   Protein, ur NEGATIVE NEGATIVE mg/dL    Nitrite NEGATIVE NEGATIVE   Leukocytes, UA NEGATIVE NEGATIVE   RBC / HPF 0-5 0 - 5 RBC/hpf   WBC, UA 0-5 0 - 5 WBC/hpf   Bacteria, UA NONE SEEN NONE SEEN   Squamous Epithelial / LPF NONE SEEN NONE SEEN  CBC with Differential     Status: Abnormal   Collection Time: 08/17/15  5:00 AM  Result Value Ref Range   WBC 9.0 3.6 - 11.0 K/uL   RBC 4.34 3.80 - 5.20 MIL/uL   Hemoglobin 14.0 12.0 - 16.0 g/dL   HCT 39.8 35.0 - 47.0 %   MCV 91.7 80.0 - 100.0 fL   MCH 32.2 26.0 - 34.0 pg   MCHC 35.1 32.0 - 36.0 g/dL   RDW 15.3 (H) 11.5 - 14.5 %   Platelets 129 (L) 150 - 440 K/uL   Neutrophils Relative % 73 %   Neutro Abs 6.5 1.4 - 6.5 K/uL   Lymphocytes Relative 19 %   Lymphs Abs 1.7 1.0 - 3.6 K/uL   Monocytes Relative 8 %   Monocytes Absolute 0.7 0.2 - 0.9 K/uL   Eosinophils Relative 0 %   Eosinophils Absolute 0.0 0 - 0.7 K/uL  Basophils Relative 0 %   Basophils Absolute 0.0 0 - 0.1 K/uL  Basic metabolic panel     Status: Abnormal   Collection Time: 08/17/15  5:00 AM  Result Value Ref Range   Sodium 122 (L) 135 - 145 mmol/L   Potassium 4.2 3.5 - 5.1 mmol/L    Comment: HEMOLYSIS AT THIS LEVEL MAY AFFECT RESULT   Chloride 86 (L) 101 - 111 mmol/L   CO2 24 22 - 32 mmol/L   Glucose, Bld 83 65 - 99 mg/dL   BUN <5 (L) 6 - 20 mg/dL   Creatinine, Ser 0.46 0.44 - 1.00 mg/dL   Calcium 8.5 (L) 8.9 - 10.3 mg/dL   GFR calc non Af Amer >60 >60 mL/min   GFR calc Af Amer >60 >60 mL/min    Comment: (NOTE) The eGFR has been calculated using the CKD EPI equation. This calculation has not been validated in all clinical situations. eGFR's persistently <60 mL/min signify possible Chronic Kidney Disease.    Anion gap 12 5 - 15  Troponin I     Status: None   Collection Time: 08/17/15  5:00 AM  Result Value Ref Range   Troponin I <0.03 <0.031 ng/mL    Comment:        NO INDICATION OF MYOCARDIAL INJURY.   Ethanol     Status: Abnormal   Collection Time: 08/17/15  6:00 AM  Result Value Ref Range    Alcohol, Ethyl (B) 34 (H) <5 mg/dL    Comment:        LOWEST DETECTABLE LIMIT FOR SERUM ALCOHOL IS 5 mg/dL FOR MEDICAL PURPOSES ONLY    Dg Lumbar Spine Complete  08/17/2015  CLINICAL DATA:  82 year old female with fall and bilateral hip pain. EXAM: LUMBAR SPINE - COMPLETE 4+ VIEW; PELVIS - 1-2 VIEW COMPARISON:  CT of the abdomen pelvis dated 05/16/2015 FINDINGS: Evaluation for acute fracture is very limited due to advanced osteopenia and multilevel degenerative changes of the spine. There are multilevel compression deformity involving L2-L5 with vertebroplasty changes similar to the prior CT. Age indeterminate compression deformity of the T11 vertebra noted. There are bilateral hip arthroplasties. There is no definite acute fracture of the pelvis. No dislocation. IMPRESSION: No acute fracture or dislocation of the pelvis or hips. Bilateral hip arthroplasties. Advanced osteopenia with multilevel degenerative changes and compression deformity and vertebroplasty of lumbar spine. Electronically Signed   By: Anner Crete M.D.   On: 08/17/2015 02:28   Dg Pelvis 1-2 Views  08/17/2015  CLINICAL DATA:  82 year old female with fall and bilateral hip pain. EXAM: LUMBAR SPINE - COMPLETE 4+ VIEW; PELVIS - 1-2 VIEW COMPARISON:  CT of the abdomen pelvis dated 05/16/2015 FINDINGS: Evaluation for acute fracture is very limited due to advanced osteopenia and multilevel degenerative changes of the spine. There are multilevel compression deformity involving L2-L5 with vertebroplasty changes similar to the prior CT. Age indeterminate compression deformity of the T11 vertebra noted. There are bilateral hip arthroplasties. There is no definite acute fracture of the pelvis. No dislocation. IMPRESSION: No acute fracture or dislocation of the pelvis or hips. Bilateral hip arthroplasties. Advanced osteopenia with multilevel degenerative changes and compression deformity and vertebroplasty of lumbar spine. Electronically Signed    By: Anner Crete M.D.   On: 08/17/2015 02:28   Ct Pelvis Wo Contrast  08/17/2015  CLINICAL DATA:  82 year old female with fall and bilateral hip pain. Evaluate for occult fracture. EXAM: CT PELVIS WITHOUT CONTRAST TECHNIQUE: Multidetector CT imaging of the pelvis  was performed following the standard protocol without intravenous contrast. COMPARISON:  Pelvic radiograph dated 08/27/2015 and abdominal CT dated 05/16/2015 FINDINGS: Evaluation of this exam is limited in the absence of intravenous contrast. Evaluation is also limited due to streak artifact caused by bilateral hip arthroplasties. The urinary bladder is distended. No dilated bowel identified within the pelvis. There is scattered colonic diverticula. There is aortoiliac atherosclerotic disease. There is ectasia of the distal aorta. There is advanced osteopenia. No acute fracture or dislocation identified. There is stable appearing area of irregularity in involving the S3 sacral bone. Stable appearing compression deformity of the L4 and L5 with vertebroplasty changes. IMPRESSION: Advanced osteopenia.  No definite acute fracture or dislocation. Electronically Signed   By: Anner Crete M.D.   On: 08/17/2015 05:32    Review of Systems  Unable to perform ROS Constitutional: Negative for fever and chills.  HENT: Positive for hearing loss.   Eyes: Negative for blurred vision.  Respiratory: Negative for cough.   Cardiovascular: Negative for chest pain.  Gastrointestinal: Negative for nausea and vomiting.  Genitourinary: Negative for dysuria.  Musculoskeletal: Negative for neck pain.  Skin: Negative for rash.  Neurological: Negative for dizziness.    Blood pressure 145/83, pulse 89, resp. rate 24, SpO2 97 %. Physical Exam  Constitutional: She is oriented to person, place, and time.  Poorly nourished deselved looking female in no acute distress.  HENT:  Head: Normocephalic and atraumatic.  Mouth/Throat: Oropharynx is clear and moist.  No oropharyngeal exudate.  Eyes: EOM are normal. Pupils are equal, round, and reactive to light. No scleral icterus.  Neck: Normal range of motion. Neck supple. No JVD present. No tracheal deviation present. No thyromegaly present.  Cardiovascular: Normal rate.   No murmur heard. Respiratory: Effort normal and breath sounds normal. No respiratory distress. She has no wheezes. She exhibits no tenderness.  GI: Soft. Bowel sounds are normal. She exhibits no mass. There is no tenderness.  Musculoskeletal: Normal range of motion. She exhibits no edema or tenderness.  Lymphadenopathy:    She has no cervical adenopathy.  Neurological: She is alert and oriented to person, place, and time. No cranial nerve deficit. Coordination normal.  Mildly intoxicated     Assessment/Plan 1. Hyponatremia: Does not appear dehydrated or fluid overload. Has been drinking a lot of beer. Suspect dilutional effect from beer. Will start gentle IV NS and repeat sodium in 6 hours.  2. Weakness: Will get PT evaluation.  3. EToH Intoxication: Alcohol level at 34 after being here for several hours. Will place on CIWA protocal.  4. HTN: On no home meds. Will start metoprolol and adjust as needed..   Time = 45 min  Baxter Hire, MD 08/17/2015, 6:59 AM

## 2015-08-17 NOTE — NC FL2 (Signed)
[place] LEVEL OF CARE SCREENING TOOL     IDENTIFICATION  Patient Name: VINNIE CARDOSA Birthdate: 1930/08/28 Sex: female Admission Date (Current Location): 08/17/2015  [place] and [place] Number:  Selena Lesser  ([id number] [facility]) Facility and Address:  [facility], [address] [place], [place] [address]      Provider Number: [id number]  Attending Physician Name and Address:  Gladstone Lighter, MD  Relative Name and Phone Number:       Current Level of Care: Hospital Recommended Level of Care: [place] Prior Approval Number:    Date Approved/Denied:   PASRR Number:  ([id number])  Discharge Plan: SNF    Current Diagnoses: Patient Active Problem List   Diagnosis Date Noted  . Hyponatremia 08/17/2015  . Compression fracture of lumbar spine, non-traumatic 05/18/2015  . Adult failure to thrive 05/18/2015  . COPD (chronic obstructive pulmonary disease) ([place]) 04/15/2015  . Malnutrition ([place]) 03/18/2015  . Trochanteric bursitis of right hip 10/20/2014  . Benign essential HTN 08/05/2014  . Hypercholesterolemia without hypertriglyceridemia 08/05/2014  . Cellulitis and abscess of leg 07/01/2014  . Wedge fracture of lumbar vertebra ([place]) 09/30/2013  . Screening for breast cancer 12/22/2010  . Alcohol abuse, episodic 12/22/2010  . Weight loss, non-intentional 12/22/2010  . Tinnitus of left ear 12/22/2010  . Thrombocytopenia ([place]) 12/22/2010  . Osteoporosis with fracture   . Rib fractures   . History of epistaxis   . GI bleed 10/29/2007    Orientation RESPIRATION BLADDER Height & Weight     Self, Time, Situation, Place  Normal Continent Weight:   Height:     BEHAVIORAL SYMPTOMS/MOOD NEUROLOGICAL BOWEL NUTRITION STATUS   (none )  (none ) Continent Diet (Regular Diet )  AMBULATORY STATUS COMMUNICATION OF NEEDS Skin   Extensive Assist Verbally Normal                       Personal Care Assistance Level of  Assistance  Bathing, Feeding, Dressing Bathing Assistance: Limited assistance Feeding assistance: Independent Dressing Assistance: Limited assistance     Functional Limitations Info  Sight, Hearing, Speech Sight Info: Adequate Hearing Info: Adequate Speech Info: Adequate    SPECIAL CARE FACTORS FREQUENCY  PT (By licensed PT), OT (By licensed OT)     PT Frequency:  (5) OT Frequency:  (5)            Contractures      Additional Factors Info  Code Status, Allergies, Psychotropic Code Status Info:  (Full Code. ) Allergies Info:  (Aspirin ) Psychotropic Info:  (Ativan )         Current Medications (08/17/2015):  This is the current hospital active medication list Current Facility-Administered Medications  Medication Dose Route Frequency Provider Last Rate Last Dose  . 0.9 %  sodium chloride infusion   Intravenous Continuous Baxter Hire, MD 50 mL/hr at 08/17/15 [phone]    . enoxaparin (LOVENOX) injection 40 mg  40 mg Subcutaneous Q24H Baxter Hire, MD      . feeding supplement (ENSURE ENLIVE) (ENSURE ENLIVE) liquid 237 mL  237 mL Oral BID BM Baxter Hire, MD      . LORazepam (ATIVAN) tablet 0-4 mg  0-4 mg Oral Q6H Paulette Blanch, MD   2 mg at 08/17/15 0659  . metoprolol tartrate (LOPRESSOR) tablet 25 mg  25 mg Oral BID Baxter Hire, MD      . sodium chloride (OCEAN) 0.65 % nasal spray 1 spray  1 spray Each Nare PRN Baxter Hire, MD      . thiamine (VITAMIN B-1) tablet 100 mg  100 mg Oral Daily Paulette Blanch, MD         Discharge Medications: Please see discharge summary for a list of discharge medications.  Relevant Imaging Results:  Relevant Lab Results:   Additional Information  (SSN: [id number])  Loralyn Freshwater, LCSW

## 2015-08-17 NOTE — Evaluation (Signed)
Physical Therapy Evaluation Patient Details Name: Alyssa Ortega MRN: [id number] DOB: 1930/11/01 Today's Date: 08/17/2015   History of Present Illness  Patient fell while trying to get up from a chair while she was watching wrestling. She has been drinking beer most of the day. Work up in ED showed no broken bones. Patient says she has had increasing difficulty walking lately. Was found to have low sodium. Pt is oriented to self and time but disoriented during PT evaluation to place or situation. No family in room but daughter in law was accessible by telephone. Daugther in law states that pt has been struggling recently with her memory. Has forgotten to pay her cable bill for 3 months recently. She has had 2-3 falls over the last 12 months and they are related to intoxication.   Clinical Impression  Pt demonstrates considerable weakness as well as instability during PT evaluation. She requires assist with bed mobility, transfers, and limited ambulation. Pt with very poor balance in standing requiring continual assist to remain upright. She is only able to take a few small steps due to poor balance and safety awareness. Pt complaining of L proximal medial tibial pain with bed mobility and palpation. No ecchymosis, edema, or erythema noted over area of pain. RN notified who agreed to monitor. Pt will need SNF placement at discharge. Attempted to contact daughter and son who are unreachable by phone. Daughter in law notified who agrees to pass along the message. Both patient and family are agreeable to SNF placement. Pt will benefit from skilled PT services to address deficits in strength, balance, and mobility in order to return to full function at home.      Follow Up Recommendations SNF    Equipment Recommendations  None recommended by PT    Recommendations for Other Services       Precautions / Restrictions Precautions Precautions: Fall Restrictions Weight Bearing Restrictions: No       Mobility  Bed Mobility Overal bed mobility: Needs Assistance Bed Mobility: Supine to Sit;Sit to Supine     Supine to sit: Mod assist Sit to supine: Min assist   General bed mobility comments: Pt reports LLE pain just distal to knee joint with bed mobility. Requires modA+1 for supine to sit due to weakness and poor sequencing. Hand over hand cues for sequencing with log roll to protect back  Transfers Overall transfer level: Needs assistance Equipment used: Rolling walker (2 wheeled) Transfers: Sit to/from Stand Sit to Stand: Min assist         General transfer comment: Pt with poor balance during sit to stand transfer. She leans posteriorly and is unable to balance in standing without continual support. Does not report increase pain in L proximal tibia with WB in standing. Increased time required to perform demonstrated bilateral LE weakness/deconditioning  Ambulation/Gait Ambulation/Gait assistance: Mod assist Ambulation Distance (Feet): 3 Feet Assistive device: Rolling walker (2 wheeled) Gait Pattern/deviations: Decreased step length - right;Decreased step length - left;Shuffle Gait velocity: Decreaswed Gait velocity interpretation: <1.8 ft/sec, indicative of risk for recurrent falls General Gait Details: Pt only able to take a few small shuffling steps forward/backwards. She requires modA+1 for balance due to continually falling posteriorly. Pt with poor safety awareness, decreased reaction time, and diminished protective reflexes. Unable to safely ambualte further at this time. Pt returned to bed  Stairs            Wheelchair Mobility    Modified Rankin (Stroke Patients Only)  Balance Overall balance assessment: Needs assistance Sitting-balance support: No upper extremity supported Sitting balance-Leahy Scale: Fair     Standing balance support: No upper extremity supported Standing balance-Leahy Scale: Poor                                Pertinent Vitals/Pain Pain Assessment: Faces Faces Pain Scale: Hurts even more Pain Location: L proximal medial tibia near knee joint. Painful with bed mobility and palpation. Pain does not appear to increase with weight bearing. No erythema, ecchymosis, or edema noted Pain Intervention(s): Monitored during session;Other (comment) (RN notified to monitor and notify MD as appropriate)    Home Living Family/patient expects to be discharged to:: Private residence Living Arrangements: Alone Available Help at Discharge: Family Type of Home: House Home Access: Stairs to enter Entrance Stairs-Rails: None Entrance Stairs-Number of Steps: 1 Home Layout: One level Home Equipment: Environmental consultant - 2 wheels;Cane - single point;Bedside commode;Shower seat Additional Comments: Prior medical record indicates 3 steps with R railing. Daughter in law today states 1 step with no railes    Prior Function Level of Independence: Needs assistance   Gait / Transfers Assistance Needed: Pt ambulates limited community distances with prn use of spc  ADL's / Homemaking Assistance Needed: Requires intermittent assist per daughter in law        Hand Dominance        Extremity/Trunk Assessment   Upper Extremity Assessment: Generalized weakness           Lower Extremity Assessment: Generalized weakness (Not following commands for UE/LE MMT)         Communication   Communication: Other (comment) (Can be difficult to understand at times)  Cognition Arousal/Alertness: Awake/alert Behavior During Therapy: WFL for tasks assessed/performed Overall Cognitive Status: No family/caregiver present to determine baseline cognitive functioning (Conversation with DIL, recently worsening cognitive function)       Memory: Decreased short-term memory              General Comments      Exercises        Assessment/Plan    PT Assessment Patient needs continued PT services  PT Diagnosis Difficulty  walking;Abnormality of gait;Generalized weakness;Acute pain   PT Problem List Decreased strength;Decreased activity tolerance;Decreased balance;Decreased mobility;Decreased cognition;Decreased knowledge of use of DME;Decreased safety awareness;Pain  PT Treatment Interventions DME instruction;Gait training;Stair training;Functional mobility training;Therapeutic activities;Therapeutic exercise;Balance training;Cognitive remediation;Neuromuscular re-education;Patient/family education   PT Goals (Current goals can be found in the Care Plan section) Acute Rehab PT Goals Patient Stated Goal: Return home PT Goal Formulation: With patient Time For Goal Achievement: 08/31/15 Potential to Achieve Goals: Fair    Frequency Min 2X/week   Barriers to discharge Decreased caregiver support Lives alone    Co-evaluation               End of Session Equipment Utilized During Treatment: Gait belt Activity Tolerance: Patient tolerated treatment well Patient left: in bed;with call bell/phone within reach;with bed alarm set Nurse Communication: Mobility status;Other (comment) (LLE pain)         Time: [id number] PT Time Calculation (min) (ACUTE ONLY): 30 min   Charges:   PT Evaluation $PT Eval Moderate Complexity: 1 Procedure     PT G Codes:       Alyssa Ortega Alyssa Ortega PT, DPT   Alyssa Ortega 08/17/2015, 2:13 PM

## 2015-08-17 NOTE — Clinical Social Work Note (Signed)
Clinical Social Work Assessment  Patient Details  Name: Alyssa Ortega MRN: [id number] Date of Birth: 1930/08/02  Date of referral:  08/17/15               Reason for consult:  Facility Placement                Permission sought to share information with:  Chartered certified accountant granted to share information::  Yes, Verbal Permission Granted  Name::      Alyssa::   Ortega   Relationship::     Contact Information:     Housing/Transportation Living arrangements for the past 2 months:  [place] of Information:  Patient Patient Interpreter Needed:  None Criminal Activity/Legal Involvement Pertinent to Current Situation/Hospitalization:  No - Comment as needed Significant Relationships:  Adult Children Lives with:  Self Do you feel safe going back to the place where you live?  Yes Need for family participation in patient care:  Yes (Comment)  Care giving concerns:  Patient lives alone in [place].    Social Worker assessment / plan:  Holiday representative ([place]) received SNF consult. PT is recommending SNF. CSW met with patient alone at bedside. CSW introduced self and explained role of CSW department. Patient reported that she lives alone in [place] and her daughter Alyssa Ortega and son Alyssa Ortega live in [place]. Per patient she drinks beer every now and then and does not drink everyday. CSW explained that PT is recommending SNF. Patient is agreeable to SNF search and has been to rehab before but cannot remember the name of the facility. Patient does not have a preference of facility. Patient gave CSW permission to call her daughter. CSW explained that patient's insurance [facility] will have to approve SNF. CSW attempted to call patient's daughter Alyssa Ortega however she did not answer and a voicemail was left.   FL2 complete and faxed out. CSW sent [place] case manager a message making her aware of above. CSW will continue to  follow and assist as needed.   Employment status:  Disabled (Comment on whether or not currently receiving Disability), Retired Nurse, adult, Medicaid In [place] PT Recommendations:  [place] / Referral to community resources:  [place]  Patient/Family's Response to care:  Patient is agreeable to [facility] in [place].   Patient/Family's Understanding of and Emotional Response to Diagnosis, Current Treatment, and Prognosis:  Patient was pleasant and thanked CSW for visit.   Emotional Assessment Appearance:  Appears stated age Attitude/Demeanor/Rapport:    Affect (typically observed):  Accepting, Adaptable, Pleasant Orientation:  Oriented to Self, Oriented to Place, Oriented to  Time, Fluctuating Orientation (Suspected and/or reported Sundowners) Alcohol / Substance use:  Alcohol Use Psych involvement (Current and /or in the community):  No (Comment)  Discharge Needs  Concerns to be addressed:  Discharge Planning Concerns Readmission within the last 30 days:  No Current discharge risk:  Dependent with Mobility, Lives alone Barriers to Discharge:  Continued Medical Work up   Loralyn Freshwater, LCSW 08/17/2015, 2:29 PM

## 2015-08-18 LAB — BASIC METABOLIC PANEL
ANION GAP: 8 (ref 5–15)
BUN: 6 mg/dL (ref 6–20)
CHLORIDE: 96 mmol/L — AB (ref 101–111)
CO2: 27 mmol/L (ref 22–32)
CREATININE: 0.39 mg/dL — AB (ref 0.44–1.00)
Calcium: 8.6 mg/dL — ABNORMAL LOW (ref 8.9–10.3)
GFR calc non Af Amer: >60 mL/min (ref >60)
Glucose, Bld: 87 mg/dL (ref 65–99)
POTASSIUM: 3 mmol/L — AB (ref 3.5–5.1)
SODIUM: 131 mmol/L — AB (ref 135–145)

## 2015-08-18 LAB — MAGNESIUM: MAGNESIUM: 1.7 mg/dL (ref 1.7–2.4)

## 2015-08-18 MED ORDER — FOLIC ACID 1 MG PO TABS: 1.0000 mg | ORAL_TABLET | Freq: Every day | ORAL | Status: DC

## 2015-08-18 MED ORDER — THIAMINE HCL 100 MG PO TABS: 100.0000 mg | ORAL_TABLET | Freq: Every day | ORAL | Status: DC

## 2015-08-18 MED ORDER — POTASSIUM CHLORIDE IN NACL 20-0.9 MEQ/L-% IV SOLN
INTRAVENOUS | Status: DC
  Filled 2015-08-18 (×2): qty 1000

## 2015-08-18 MED ORDER — SALINE SPRAY 0.65 % NA SOLN: 1.0000 | NASAL | Status: DC | PRN

## 2015-08-18 MED ORDER — ENSURE ENLIVE PO LIQD: 237.0000 mL | Freq: Three times a day (TID) | ORAL | Status: DC

## 2015-08-18 MED ORDER — METOPROLOL TARTRATE 25 MG PO TABS: 25.0000 mg | ORAL_TABLET | Freq: Two times a day (BID) | ORAL | Status: DC

## 2015-08-18 MED ORDER — ACETAMINOPHEN 500 MG PO TABS: 500.0000 mg | ORAL_TABLET | Freq: Four times a day (QID) | ORAL | Status: AC | PRN

## 2015-08-18 MED ORDER — POTASSIUM CHLORIDE CRYS ER 20 MEQ PO TBCR
40.0000 meq | EXTENDED_RELEASE_TABLET | ORAL | Status: AC
  Administered 2015-08-18: 40 meq via ORAL
  Filled 2015-08-18: qty 2

## 2015-08-18 NOTE — Progress Notes (Signed)
Patient complaints of chest pain. Assessed VS, results as follows: 126/75, 88, 98.9, 97% RA. Awaiting response. Patient with history of anxiety.

## 2015-08-18 NOTE — Progress Notes (Signed)
Floor mats placed. Pt calm and cooperative no acute distress noted.

## 2015-08-18 NOTE — Progress Notes (Signed)
Clinical Education officer, museum (CSW) presented bed offers to patient. She chose [facility]. CSW contacted patient's daughter [name] and made her aware of above. Daughter is agreeable for patient to go to [facility]. [facility] liaison is aware of accepted bed offer.   Blima Rich, LCSW [phone]

## 2015-08-18 NOTE — Progress Notes (Signed)
Patient is medically stable for D/C to Alyssa Ortega today. Per [place] [facility] Alyssa Ortega liaison patient will go to room 504. RN will call report to Theressa Stamps and arrange EMS for transport. [facility] authorization has been received. Auth # [id number]. Clinical Education officer, museum (CSW) sent D/C Summary, FL2 and D/C Packet to [employer] via [employer]. Patient is aware of above. Patient's daughter [name] is aware of above. Please reconsult if future social work needs arise. CSW signing off.   Blima Rich, LCSW [phone]

## 2015-08-18 NOTE — Progress Notes (Signed)
Patient discharge summary reviewed in report to [place], LPN. At [facility], going to rm 504. EMS called to transport. Belongings packed and given upon discharge. Daughter [name] notified of EMS arrival.

## 2015-08-18 NOTE — Clinical Social Work Placement (Signed)
   CLINICAL SOCIAL WORK PLACEMENT  NOTE  Date:  08/18/2015  Patient Details  Name: Alyssa Ortega MRN: [id number] Date of Birth: January 20, 1931  Clinical Social Work is seeking post-discharge placement for this patient at the [place] level of care (*CSW will initial, date and re-position this form in  chart as items are completed):  Yes   Patient/family provided with [place] Work Department's list of facilities offering this level of care within the geographic area requested by the patient (or if unable, by the patient's family).  Yes   Patient/family informed of their freedom to choose among providers that offer the needed level of care, that participate in Medicare, Medicaid or managed care program needed by the patient, have an available bed and are willing to accept the patient.  Yes   Patient/family informed of [place]'s ownership interest in [facility] and [facility], as well as of the fact that they are under no obligation to receive care at these facilities.  PASRR submitted to EDS on       PASRR number received on       Existing PASRR number confirmed on 08/17/15     FL2 transmitted to all facilities in geographic area requested by pt/family on 08/17/15     FL2 transmitted to all facilities within larger geographic area on       Patient informed that his/her managed care company has contracts with or will negotiate with certain facilities, including the following:        Yes   Patient/family informed of bed offers received.  [facility] )     Physician recommends and patient chooses bed at      Patient to be transferred to  ([facility] ) on 08/18/15.  Patient to be transferred to facility by  [facility] )     Patient family notified on 08/18/15 of transfer.  Name of family member notified:   (Patient's daughter [name] is aware of D/C today. )     PHYSICIAN       Additional Comment:     _______________________________________________ Loralyn Freshwater, LCSW 08/18/2015, 1:37 PM

## 2015-08-18 NOTE — Discharge Summary (Signed)
[place] [employer] at [place] NAME: Alyssa Ortega    MR#:  [id number]  DATE OF BIRTH:  1931/01/26  DATE OF ADMISSION:  08/17/2015 ADMITTING PHYSICIAN: Baxter Hire, MD  DATE OF DISCHARGE: 08/18/15  PRIMARY CARE PHYSICIAN: Park Liter, DO    ADMISSION DIAGNOSIS:  Adult failure to thrive [R62.7] Hyponatremia [E87.1] Generalized weakness [R53.1] Fall, initial encounter [W19.XXXA]  DISCHARGE DIAGNOSIS:  Active Problems:   Hyponatremia   SECONDARY DIAGNOSIS:   Past Medical History  Diagnosis Date  . Hyperlipidemia   . Hypertension   . GI bleed 10/2007    secondary to duodenal ulcers  . History of epistaxis     had MRI by Dr. Carlis Abbott  . Hip fracture ([place]) 3/09    left, s/p hemiarthroplasty  . Rib fractures     traumatic  . Insomnia   . Osteoporosis   . Bursitis   . Dehydration   . Gastritis     HOSPITAL COURSE:   82 year old female with past medical history significant for hypertension, hyperlipidemia and peptic ulcer disease presents to the hospital secondary to mechanical fall. Noted to have low sodium.  #1 hyponatremia-beer potomania and also dehydration -Improved with IV saline  #2 Hypokalemia- will be replaced, no diarrhea  #3 alcohol abuse-was on CIWA protocol.  - on thiamine and folic acid supplements. - counseled, hasn't been requiring ativan now  #3 generalized deconditioning- physical therapy recommended rehab  #4 hypertension-metoprolol started.  Discharge today, if bed available  DISCHARGE CONDITIONS:   Guarded  CONSULTS OBTAINED:   None  DRUG ALLERGIES:   Allergies  Allergen Reactions  . Aspirin Hives and Other (See Comments)    Nosebleed per patient    DISCHARGE MEDICATIONS:   Current Discharge Medication List    START taking these medications   Details  acetaminophen (TYLENOL) 500 MG tablet Take 1 tablet (500 mg total) by mouth every 6 (six) hours as needed for mild pain, moderate  pain, fever or headache. Qty: 30 tablet, Refills: 0    folic acid (FOLVITE) 1 MG tablet Take 1 tablet (1 mg total) by mouth daily. Qty: 30 tablet, Refills: 2    metoprolol tartrate (LOPRESSOR) 25 MG tablet Take 1 tablet (25 mg total) by mouth 2 (two) times daily. Qty: 60 tablet, Refills: 2    sodium chloride (OCEAN) 0.65 % SOLN nasal spray Place 1 spray into both nostrils as needed (moisture). Qty: 15 mL, Refills: 0    thiamine 100 MG tablet Take 1 tablet (100 mg total) by mouth daily. Qty: 30 tablet, Refills: 2      CONTINUE these medications which have NOT CHANGED   Details  feeding supplement, ENSURE ENLIVE, (ENSURE ENLIVE) LIQD Take 237 mLs by mouth 2 (two) times daily between meals. Qty: 237 mL, Refills: 12      STOP taking these medications     acetaminophen (TYLENOL) 650 MG CR tablet      traMADol (ULTRAM) 50 MG tablet          DISCHARGE INSTRUCTIONS:   1. PCP f/u in 1-2 weeks 2. Physical Therapy   If you experience worsening of your admission symptoms, develop shortness of breath, life threatening emergency, suicidal or homicidal thoughts you must seek medical attention immediately by calling 911 or calling your MD immediately  if symptoms less severe.  You Must read complete instructions/literature along with all the possible adverse reactions/side effects for all the Medicines you take and that have been prescribed  to you. Take any new Medicines after you have completely understood and accept all the possible adverse reactions/side effects.   Please note  You were cared for by a hospitalist during your hospital stay. If you have any questions about your discharge medications or the care you received while you were in the hospital after you are discharged, you can call the unit and asked to speak with the hospitalist on call if the hospitalist that took care of you is not available. Once you are discharged, your primary care physician will handle any further  medical issues. Please note that NO REFILLS for any discharge medications will be authorized once you are discharged, as it is imperative that you return to your primary care physician (or establish a relationship with a primary care physician if you do not have one) for your aftercare needs so that they can reassess your need for medications and monitor your lab values.    Today   CHIEF COMPLAINT:   Chief Complaint  Patient presents with  . Fall    VITAL SIGNS:  Blood pressure 156/77, pulse 89, temperature 98 F (36.7 C), temperature source Oral, resp. rate 18, height 5\' 4"  (1.626 m), weight 57.425 kg (126 lb 9.6 oz), SpO2 98 %.  I/O:   Intake/Output Summary (Last 24 hours) at 08/18/15 0953 Last data filed at 08/18/15 0938  Gross per 24 hour  Intake    340 ml  Output    150 ml  Net    190 ml    PHYSICAL EXAMINATION:   Physical Exam  GENERAL:  82 y.o.-year-old patient lying in the bed with no acute distress.  EYES: Pupils equal, round, reactive to light and accommodation. No scleral icterus. Extraocular muscles intact.  HEENT: Head atraumatic, normocephalic. Oropharynx and nasopharynx clear. Patient does not have any teeth NECK:  Supple, no jugular venous distention. No thyroid enlargement, no tenderness.  LUNGS: Normal breath sounds bilaterally, no wheezing, rales,rhonchi or crepitation. No use of accessory muscles of respiration.  CARDIOVASCULAR: S1, S2 normal. No murmurs, rubs, or gallops.  ABDOMEN: Soft, non-tender, non-distended. Bowel sounds present. No organomegaly or mass.  EXTREMITIES: No pedal edema, cyanosis, or clubbing.  NEUROLOGIC: Cranial nerves II through XII are intact. Muscle strength 5/5 in all extremities. Sensation intact. Gait not checked.  PSYCHIATRIC: The patient is alert and oriented x 3. Very stubborn. SKIN: No obvious rash, lesion, or ulcer.   DATA REVIEW:   CBC  Recent Labs Lab 08/17/15 0500  WBC 9.0  HGB 14.0  HCT 39.8  PLT 129*     Chemistries   Recent Labs Lab 08/18/15 0459  NA 131*  K 3.0*  CL 96*  CO2 27  GLUCOSE 87  BUN 6  CREATININE 0.39*  CALCIUM 8.6*  MG 1.7    Cardiac Enzymes  Recent Labs Lab 08/17/15 0500  TROPONINI <0.03    Microbiology Results  Results for orders placed or performed during the hospital encounter of 07/01/14  Blood culture (routine x 2)     Status: None   Collection Time: 07/01/14  2:11 PM  Result Value Ref Range Status   Specimen Description BLOOD  Final   Special Requests BOTTLES DRAWN AEROBIC AND ANAEROBIC  Final   Culture NO GROWTH 5 DAYS  Final   Report Status 07/06/2014 FINAL  Final  Blood culture (routine x 2)     Status: None   Collection Time: 07/01/14  2:11 PM  Result Value Ref Range Status   Specimen Description  BLOOD  Final   Special Requests BOTTLES DRAWN AEROBIC AND ANAEROBIC  Final   Culture NO GROWTH 5 DAYS  Final   Report Status 07/06/2014 FINAL  Final    RADIOLOGY:  Dg Lumbar Spine Complete  08/17/2015  CLINICAL DATA:  82 year old female with fall and bilateral hip pain. EXAM: LUMBAR SPINE - COMPLETE 4+ VIEW; PELVIS - 1-2 VIEW COMPARISON:  CT of the abdomen pelvis dated 05/16/2015 FINDINGS: Evaluation for acute fracture is very limited due to advanced osteopenia and multilevel degenerative changes of the spine. There are multilevel compression deformity involving L2-L5 with vertebroplasty changes similar to the prior CT. Age indeterminate compression deformity of the T11 vertebra noted. There are bilateral hip arthroplasties. There is no definite acute fracture of the pelvis. No dislocation. IMPRESSION: No acute fracture or dislocation of the pelvis or hips. Bilateral hip arthroplasties. Advanced osteopenia with multilevel degenerative changes and compression deformity and vertebroplasty of lumbar spine. Electronically Signed   By: Anner Crete M.D.   On: 08/17/2015 02:28   Dg Pelvis 1-2 Views  08/17/2015  CLINICAL DATA:  82 year old female  with fall and bilateral hip pain. EXAM: LUMBAR SPINE - COMPLETE 4+ VIEW; PELVIS - 1-2 VIEW COMPARISON:  CT of the abdomen pelvis dated 05/16/2015 FINDINGS: Evaluation for acute fracture is very limited due to advanced osteopenia and multilevel degenerative changes of the spine. There are multilevel compression deformity involving L2-L5 with vertebroplasty changes similar to the prior CT. Age indeterminate compression deformity of the T11 vertebra noted. There are bilateral hip arthroplasties. There is no definite acute fracture of the pelvis. No dislocation. IMPRESSION: No acute fracture or dislocation of the pelvis or hips. Bilateral hip arthroplasties. Advanced osteopenia with multilevel degenerative changes and compression deformity and vertebroplasty of lumbar spine. Electronically Signed   By: Anner Crete M.D.   On: 08/17/2015 02:28   Ct Pelvis Wo Contrast  08/17/2015  CLINICAL DATA:  82 year old female with fall and bilateral hip pain. Evaluate for occult fracture. EXAM: CT PELVIS WITHOUT CONTRAST TECHNIQUE: Multidetector CT imaging of the pelvis was performed following the standard protocol without intravenous contrast. COMPARISON:  Pelvic radiograph dated 08/27/2015 and abdominal CT dated 05/16/2015 FINDINGS: Evaluation of this exam is limited in the absence of intravenous contrast. Evaluation is also limited due to streak artifact caused by bilateral hip arthroplasties. The urinary bladder is distended. No dilated bowel identified within the pelvis. There is scattered colonic diverticula. There is aortoiliac atherosclerotic disease. There is ectasia of the distal aorta. There is advanced osteopenia. No acute fracture or dislocation identified. There is stable appearing area of irregularity in involving the S3 sacral bone. Stable appearing compression deformity of the L4 and L5 with vertebroplasty changes. IMPRESSION: Advanced osteopenia.  No definite acute fracture or dislocation. Electronically  Signed   By: Anner Crete M.D.   On: 08/17/2015 05:32    EKG:   Orders placed or performed during the hospital encounter of 03/15/15  . ED EKG  . ED EKG      Management plans discussed with the patient, family and they are in agreement.  CODE STATUS:     Code Status Orders        Start     Ordered   08/17/15 0830  Full code   Continuous     08/17/15 0829    Code Status History    Date Active Date Inactive Code Status Order ID Comments User Context   07/01/2014  4:41 PM 07/03/2014  5:59 PM Full Code  [id number]  Nicholes Mango, MD Inpatient      TOTAL TIME TAKING CARE OF THIS PATIENT: 37 minutes.    Gladstone Lighter M.D on 08/18/2015 at 9:53 AM  Between 7am to 6pm - Pager - [phone]  After 6pm go to [email or web address] - password [email or web address] [place] [facility]  Office  [phone]  CC: Primary care physician; Park Liter, DO

## 2015-08-18 NOTE — Plan of Care (Signed)
Problem: Safety: Goal: Ability to remain free from injury will improve Outcome: Progressing Pt resting between care. Floor mats in place. Frequent rounding. NO CIWA S/S present. IV fluids infusing

## 2015-08-18 NOTE — Progress Notes (Signed)
Initial Nutrition Assessment  DOCUMENTATION CODES:   Severe malnutrition in context of social or environmental circumstances  INTERVENTION:   Cater to pt preferences on Regular Diet order. Encouraged pt to eat her meals, not just drink supplements for improved nutrition.  Recommend increasing Ensure Enlive po to TID, each supplement provides 350 kcal and 20 grams of protein and sending Magic Cup BID as well.     NUTRITION DIAGNOSIS:   Malnutrition related to social / environmental circumstances as evidenced by energy intake < or equal to 50% for > or equal to 1 month, severe depletion of muscle mass, mild depletion of body fat.  GOAL:   Patient will meet greater than or equal to 90% of their needs  MONITOR:   PO intake, Supplement acceptance, Labs, Weight trends, I & O's  REASON FOR ASSESSMENT:   Malnutrition Screening Tool    ASSESSMENT:   Pt admitted after falling with hyponatremia. Pt with EtOH use PTA, currently on CIWA.  Past Medical History  Diagnosis Date  . Hyperlipidemia   . Hypertension   . GI bleed 10/2007    secondary to duodenal ulcers  . History of epistaxis     had MRI by Dr. Carlis Abbott  . Hip fracture ([place]) 3/09    left, s/p hemiarthroplasty  . Rib fractures     traumatic  . Insomnia   . Osteoporosis   . Bursitis   . Dehydration   . Gastritis     Diet Order:  Diet regular Room service appropriate?: Yes; Fluid consistency:: Thin Diet - low sodium heart healthy   Pt family member reports pt ate 2 bites of cereal and eggs this morning (recorded 75% per I/O chart). Pt was drinking Ensure on visit, almost finished. Pt reports liking her Ensures better.  Pt son reports pt has had very poor po intake for over one year. Pt son reports pt drinks alcohol as opposed to eating and will drink her Ensures mostly 1-2 daily. Pt son reports sometimes she will start to eat a little better but will fall back on EtOH use instead.   Pt reports not being able to hardly  stand therefore she does not make many meals. Family at bedside reports they help make her meals, if she will eat them. Pt reports no trouble swallowing or chewing.    Medications: Folic acid, KCl, thiamine, Ativan Labs: Sodium 131, Potassium 3.0, Magnesium WDL,    Gastrointestinal Profile: Last BM: 08/18/2015   Nutrition-Focused Physical Exam Findings: Nutrition-Focused physical exam completed. Findings are mild-moderate fat depletion, moderate-severe muscle depletion, and no edema.     Weight Change: Pt reports weight has been stable. Per [facility] weight trends pt relatively stable.   Skin:  Reviewed, no issues   Height:   Ht Readings from Last 1 Encounters:  08/17/15 5\' 4"  (1.626 m)    Weight:   Wt Readings from Last 1 Encounters:  08/17/15 126 lb 9.6 oz (57.425 kg)   Wt Readings from Last 10 Encounters:  08/17/15 126 lb 9.6 oz (57.425 kg)  08/09/15 127 lb (57.607 kg)  07/20/15 132 lb (59.875 kg)  06/11/15 130 lb (58.968 kg)  05/18/15 133 lb (60.328 kg)  05/16/15 126 lb 1.6 oz (57.199 kg)  05/11/15 131 lb (59.421 kg)  04/15/15 125 lb (56.7 kg)  03/18/15 124 lb (56.246 kg)  03/15/15 130 lb (58.968 kg)    BMI:  Body mass index is 21.72 kg/(m^2).  Estimated Nutritional Needs:   Kcal:  1710-1995kcals  Protein:  68-85g protein  Fluid:  >/= 1.8L fluid  EDUCATION NEEDS:   Education needs no appropriate at this time  Dwyane Luo, RD, LDN Pager [phone] Weekend/On-Call Pager [phone]

## 2015-08-19 ENCOUNTER — Ambulatory Visit: Payer: Self-pay | Admitting: *Deleted

## 2015-08-25 ENCOUNTER — Encounter: Payer: Self-pay | Admitting: *Deleted

## 2015-08-25 ENCOUNTER — Ambulatory Visit: Payer: Self-pay | Admitting: *Deleted

## 2015-08-25 NOTE — Progress Notes (Signed)
This encounter was created in error - please disregard.

## 2015-08-26 ENCOUNTER — Other Ambulatory Visit: Payer: Self-pay | Admitting: *Deleted

## 2015-08-26 ENCOUNTER — Ambulatory Visit: Payer: Self-pay | Admitting: *Deleted

## 2015-08-26 NOTE — Patient Outreach (Addendum)
[facility]) Care Management  [facility] Social Work  6/75/9163  Alyssa Ortega [birth year] [id number]  Subjective:   Patient is a 82 year old female, currently in rehab at [facility] following a fall. Objective:   Encounter Medications:  Outpatient Encounter Prescriptions as of 08/26/2015  Medication Sig  . acetaminophen (TYLENOL) 500 MG tablet Take 1 tablet (500 mg total) by mouth every 6 (six) hours as needed for mild pain, moderate pain, fever or headache.  . feeding supplement, ENSURE ENLIVE, (ENSURE ENLIVE) LIQD Take 237 mLs by mouth 2 (two) times daily between meals.  . folic acid (FOLVITE) 1 MG tablet Take 1 tablet (1 mg total) by mouth daily.  . metoprolol tartrate (LOPRESSOR) 25 MG tablet Take 1 tablet (25 mg total) by mouth 2 (two) times daily.  Marland Kitchen thiamine 100 MG tablet Take 1 tablet (100 mg total) by mouth daily.  . sodium chloride (OCEAN) 0.65 % SOLN nasal spray Place 1 spray into both nostrils as needed (moisture). (Patient not taking: Reported on 08/26/2015)   No facility-administered encounter medications on file as of 08/26/2015.    Functional Status:  In your present state of health, do you have any difficulty performing the following activities: 08/17/2015 06/29/2015  Hearing? N N  Vision? N Y  Difficulty concentrating or making decisions? Y N  Walking or climbing stairs? Y Y  Dressing or bathing? N N  Doing errands, shopping? Y Y  Conservation officer, nature and eating ? - N  Using the Toilet? - N  In the past six months, have you accidently leaked urine? - N  Do you have problems with loss of bowel control? - N  Managing your Medications? - N  Managing your Finances? - N  Housekeeping or managing your Housekeeping? - Y    Fall/Depression Screening:  PHQ 2/9 Scores 07/20/2015 06/29/2015 06/09/2015 03/30/2015 02/05/2015 01/28/2015 08/05/2014  PHQ - 2 Score 0 0 0 0 0 0 0    Assessment:  This social worker updated on patient's progress in rehab.  Per [name],  discharge planner at [facility], patient doing well in rehab.  Actively participating in [place] and PT.  Patient's discharge plan is to return home with home health services. No discharge date has been set yet.  This Education officer, museum met with patient in her room following her PT/OT treatment.  Patient states that she fell at home, alcohol was not involved, her legs are just weak.  Per patient, she received a call from the Department of [place] regarding her food stamp application status.  Per patient, she has now declined the need for food stamps. "I have enough money for food".  Patient concerned that she cannot get any long term help at home " I am entitled to some help"  Patient adamant that she wants to remain in her home.  This Education officer, museum explained that because patient does not have full Medicaid benefits, she is not eligible to receive personal care services through [facility] at no charge to her.  Personal care services would be an out of pocket expense.  Patient frustrated with this, however states that she has approached [employer] frined regarding the possibility of assisting her with ongoing in home care.  Per patient,s he will do this for free, although she has offered to pay her, however she is not consistent with the care,  There is also question that this friend may have taken money out of her account illegally.  Per patient,  she received a notice from the bank about possible fraudulent activities on her account, however patient does not believes that this friend would do this.  Every bank transaction Alyssa Ortega has made has been approved by patient.  Patient continues with plan for [name] to continue to assist her once she returns home.  Patient's son calls to check on patient, daughter Alyssa Ortega visit regularly.  Alyssa Ortega was visiting daily but patient told her she did not have to visit daily due to her work schedule Plan:  This social worker will continue to follow  patient while in rehab.  This Education officer, museum will follow up within 2 weeks.

## 2015-08-27 ENCOUNTER — Inpatient Hospital Stay: Payer: Commercial Managed Care - HMO | Admitting: Family Medicine

## 2015-09-02 ENCOUNTER — Ambulatory Visit: Payer: Self-pay | Admitting: *Deleted

## 2015-09-06 ENCOUNTER — Other Ambulatory Visit: Payer: Self-pay | Admitting: *Deleted

## 2015-09-06 ENCOUNTER — Ambulatory Visit: Payer: Self-pay | Admitting: *Deleted

## 2015-09-06 NOTE — Patient Outreach (Signed)
[facility]) Care Management  [id number]  Alyssa Ortega [id number]   Phone call to discharge planner at [facility] to discuss patient's discharge plan.  Voicemail message left for discharge planner Donnalee Curry requesting a return call.    Sheralyn Boatman [facility] Care Management [phone]

## 2015-09-07 ENCOUNTER — Other Ambulatory Visit: Payer: Self-pay | Admitting: *Deleted

## 2015-09-07 NOTE — Patient Outreach (Signed)
  [facility]) Care Management  [facility] Social Work  [id number]  MERLEE KELLMAN [id number]  Subjective:  Patient is a 82 year old female currently receiving rehab at [facility].  Objective:   Encounter Medications:  Outpatient Encounter Prescriptions as of 09/07/2015  Medication Sig  . acetaminophen (TYLENOL) 500 MG tablet Take 1 tablet (500 mg total) by mouth every 6 (six) hours as needed for mild pain, moderate pain, fever or headache.  . feeding supplement, ENSURE ENLIVE, (ENSURE ENLIVE) LIQD Take 237 mLs by mouth 2 (two) times daily between meals.  . folic acid (FOLVITE) 1 MG tablet Take 1 tablet (1 mg total) by mouth daily.  . metoprolol tartrate (LOPRESSOR) 25 MG tablet Take 1 tablet (25 mg total) by mouth 2 (two) times daily.  . sodium chloride (OCEAN) 0.65 % SOLN nasal spray Place 1 spray into both nostrils as needed (moisture). (Patient not taking: Reported on 08/26/2015)  . thiamine 100 MG tablet Take 1 tablet (100 mg total) by mouth daily.   No facility-administered encounter medications on file as of 09/07/2015.    Functional Status:  In your present state of health, do you have any difficulty performing the following activities: 08/17/2015 06/29/2015  Hearing? N N  Vision? N Y  Difficulty concentrating or making decisions? Y N  Walking or climbing stairs? Y Y  Dressing or bathing? N N  Doing errands, shopping? Y Y  Conservation officer, nature and eating ? - N  Using the Toilet? - N  In the past six months, have you accidently leaked urine? - N  Do you have problems with loss of bowel control? - N  Managing your Medications? - N  Managing your Finances? - N  Housekeeping or managing your Housekeeping? - Y    Fall/Depression Screening:  PHQ 2/9 Scores 07/20/2015 06/29/2015 06/09/2015 03/30/2015 02/05/2015 01/28/2015 08/05/2014  PHQ - 2 Score 0 0 0 0 0 0 0    Assessment:  Phone call from discharge planner, Donnalee Curry who provided this Education officer, museum with an  update regarding patient's care while in rehab at [facility].  Per [name], patient has had a decline.  She may possibly have a UTI.  Lab results pending to confirm.  Patient is out of therapy days, insurance will no longer cover patient however the family has file an appeal.  Appeal results pending.  Patient's discharge date will depend on appeal status, however the plan is for patient to return home with home health.  Plan:  This social worker will follow up with discharge planner on 09/09/15 regarding patient's discharge status.

## 2015-09-09 ENCOUNTER — Other Ambulatory Visit: Payer: Self-pay | Admitting: *Deleted

## 2015-09-09 NOTE — Patient Outreach (Signed)
[facility]) Care Management  [id number]  JELLISA MCTEE [id number]   Phone call from Donnalee Curry, discharge planner at [facility] stating that patient discharged today.  Patient's family not to move forward with the appeal.  Patient discharged with home health through [facility].  This social worker will inform RNCM for transition of care.   Sheralyn Boatman [facility] Care Management [phone]

## 2015-09-10 ENCOUNTER — Encounter: Payer: Self-pay | Admitting: *Deleted

## 2015-09-10 ENCOUNTER — Other Ambulatory Visit: Payer: Self-pay | Admitting: *Deleted

## 2015-09-10 NOTE — Patient Outreach (Signed)
[facility]) Care Management  [id number]  Alyssa Ortega [id number]   Transition of care call  RNCM  Placed call to patient as part of the transition of care program, HIPAA identifier information verified. Alyssa Ortega reports she is glad to be at her home, she reports she is still a little tired, and her stomach is still bothering he some, but much better than it was denies nausea or vomiting.  Patient discussed that they told her she had a urinary tract infection, but she states, I don't believe that because I didn't feel like.   Discussed with Alyssa Ortega her medication she received at discharge from the rehab facility, she states that she has the medication if she decides to take it. States she is only taking tylenol as needed. Patient states she has one pill for stomach acid that she doesn't need and one other white pill that she is not able to read me the name of .  Alyssa Ortega stated her daughter picked her up from the rehab and helped her get settled at home and knows about her medication, Attempted to call her daughter for assistance regarding what medication patient has at home, no answer left a HIPAA compliant message with my return call number.  Reinforced with patient the importance of taking medication as prescribed to prevent further problems  .When asked whether she had eaten today, she states she just doesn't feel like eating , she has drunk one ensure and been drinking sprite.  AlyssaOrtega states she has plenty of food in the refrigerator and cabinets. Alyssa Ortega report that she is managing well at home, using her cane.   Alyssa Ortega understands that she is to be followed by home health but states she had not heard from agency. Alyssa Ortega is aware of her scheduled office visit with PCP on 7/24, has date written down and will have transportation.    Plan Will continue to follow patient with weekly outreaches for transition of care, next outreach  planned within the next week. Placed call to [facility] and first visit is scheduled for 7/15 for nursing and therapy, updated Alyssa Ortega. Will collaborate with [facility] first of  the week regarding visit .   THN CM Care Plan Problem One        Most Recent Value   Care Plan Problem One  Knowledge deficit related to fall prevention measures as evidenced by recent hospital and rehab  admission                            Role Documenting the Problem One  Care Management [place] for Problem One  Active   THN Long Term Goal (31-90 days)  Patient will not experience hospital admission in the next 31 days   THN Long Term Goal Start Date  09/10/15   Interventions for Problem One Long Term Goal  Reviewed transition of care program, weekly outreachs, provided my contact infomation again, , reinforced following discharge instructions ,taking medicatiion and notifying MD sooner for new or unrelieved symptomss   THN CM Short Term Goal #1 (0-30 days)  Patient will verbalize using her cane at all times   [facility] CM Short Term Goal #1 Start Date  09/10/15   Interventions for Short Term Goal #1  Discussed with patient importance of always using her cane to help with balance and fall prevention    THN CM Short  Term Goal #2 (0-30 days)  Patient will report taking medication daily as prescribed in the next 30 days   THN CM Short Term Goal #2 Start Date  09/10/15   Interventions for Short Term Goal #2  Discussed with patient the importance of taking medication as prescribed, to help treat problems,      Joylene Draft, RN, [place] Management [phone]- Mobile [phone]- [place] Office

## 2015-09-14 ENCOUNTER — Encounter: Payer: Self-pay | Admitting: *Deleted

## 2015-09-14 ENCOUNTER — Other Ambulatory Visit: Payer: Self-pay | Admitting: *Deleted

## 2015-09-14 NOTE — Patient Outreach (Signed)
[facility]) Care Management  [id number]  ASIALYNN FILBRUN [id number]   Transition of care call  RNCM placed call to Mrs.Goode as part of the transition of care program. Mrs.Philbin reports that she is tired, she discussed that the home health care RN and therapy visited on today, and the therapist plans to visit her again on tomorrow for therapy session.  Mrs.Aguallo discussed that she is taking her two pills, that she has prescribed , she is  unable to list the name of the medications. Mrs.Caillier states that she hasn't eaten yet today, but she will .  Reviewed with Mrs.Burgner follow up appointment with PCP on 10/21/15, she states that she is unsure if her daughter will provide transportation , I offered to call her daughter patient states that she will do it, states her daughter works 3rd shift and she is sleeping now.  Discussed the importance of attending appointment.  RN placed call to  [facility] home health RN, Ebony Hail to collaborate regarding her visit today. Ebony Hail reports patient  medication available, she reinforced medication adherence, PCP follow up and will contact patient daughter regarding providing transportation to appointment.  Plan Will plan transition of care home visit within the next week as part of the weekly transition of care outreach. Will verify that patient has transportation to appointment on 09/20/15.  THN CM Care Plan Problem One        Most Recent Value   Care Plan Problem One  Knowledge deficit related to fall prevention measures as evidenced by recent hospital and rehab  admission                            Role Documenting the Problem One  Care Management [place] for Problem One  Active   THN Long Term Goal (31-90 days)  Patient will not experience hospital admission in the next 31 days   THN Long Term Goal Start Date  09/10/15   Interventions for Problem One Long Term Goal  RN has scheduled home visit as  part of transition of care program,    THN CM Short Term Goal #1 (0-30 days)  Patient will verbalize using her cane at all times the next 30 days   THN CM Short Term Goal #1 Start Date  09/10/15   Interventions for Short Term Goal #1  Reinforced with patient to use her cane or walker, in the home to prevent related to fall   THN CM Short Term Goal #2 (0-30 days)  Patient will report taking medication daily as prescribed in the next 30 days   THN CM Short Term Goal #2 Start Date  09/10/15   Interventions for Short Term Goal #2  Reinforced with patient importance of taking medication as prescribed    THN CM Short Term Goal #3 (0-30 days)  Patient will atttend PCP appointment in the next 7 days   THN CM Short Term Goal #3 Start Date  09/14/15   Interventions for Short Tern Goal #3  Discussed with patient importance of attending PCP post hospital visit for review of medications, verify that patient has transportation to visit, offered to call her daughter to make sure she is available to provide transportation      Joylene Draft, RN, [place] Management [phone]- Mobile [phone]- [place]

## 2015-09-14 NOTE — Patient Outreach (Signed)
[facility]) Care Management  [id number]  Alyssa Ortega [id number]   Follow up phone call to patient to assess for continued social work needs following her SNF stay.  Per patient, she was very tired today as the physical therapist from [facility] had just left her home.  Patient wanting to cancel this service "I don't feel like I need it, I only fell once".  This Education officer, museum encouraged patient to continue with the treatments to get stronger.  Patient, continues to be adamant that she wants to remain in her own home. Per patient, she has a neighbor that checks in on her, as well as her daughter and family friend. Patient's family friend transports patient to the doctor, the grocery store and to pay bills. Patient describes a positive family support.  A Food stamp application completed due to patient's limited income and submitted to the Department of Social Services approximately 1 month ago, however patient has declined need for this at this time stating that she has plenty of food to eat.  Patient verbalized no further social work needs. This Education officer, museum will close this case to social work at this time.  This social worker will inform RNCM and medical provider of case closure.  Sheralyn Boatman [facility] Care Management [phone]

## 2015-09-16 ENCOUNTER — Ambulatory Visit: Payer: Self-pay | Admitting: *Deleted

## 2015-09-17 ENCOUNTER — Encounter: Payer: Self-pay | Admitting: *Deleted

## 2015-09-17 ENCOUNTER — Other Ambulatory Visit: Payer: Self-pay | Admitting: *Deleted

## 2015-09-17 NOTE — Patient Outreach (Signed)
[facility]) Care Management   [id number]  Alyssa Ortega [id number]  Alyssa Ortega is an 82 y.o. female  Subjective:  "I am okay except for this pain in my hips when I move around, it settles down when I get in my chair" Patient discussed that she takes tylenol as needed but hasn't taken any today. Alyssa Ortega discussed that she doesn't have a appetite sometimes, but I have ensure, food to make sandwiches, peanut butter. My Ortega makes sure I have food. Alyssa Ortega discussed that she feels nauseated at times, no vomiting, patient states her most recent weight at home 122.     Objective:  BP 128/72 mmHg  Pulse 96  Resp 18  SpO2 97%  LMP  (LMP Unknown)  Patient resting in her recliner chair with cane near by. Review of Systems  Constitutional: Negative.   HENT: Negative.   Eyes: Negative.   Cardiovascular: Negative.   Gastrointestinal: Positive for nausea.       No appetite  Genitourinary: Negative.   Musculoskeletal: Positive for back pain and joint pain.  Skin: Negative.   Neurological: Negative.   Psychiatric/Behavioral: Negative.     Physical Exam  Constitutional: She is oriented to person, place, and time. She appears well-developed and well-nourished.  Cardiovascular: Normal rate, normal heart sounds and intact distal pulses.   Respiratory: Effort normal.  GI: Soft.  Musculoskeletal:  Using cane in the home  Neurological: She is alert and oriented to person, place, and time.  Skin: Skin is warm and dry.  Psychiatric: She has a normal mood and affect. Her behavior is normal. Judgment and thought content normal.    Encounter Medications:   Outpatient Encounter Prescriptions as of 09/17/2015  Medication Sig  . acetaminophen (TYLENOL) 500 MG tablet Take 1 tablet (500 mg total) by mouth every 6 (six) hours as needed for mild pain, moderate pain, fever or headache.  . feeding supplement, ENSURE ENLIVE, (ENSURE ENLIVE) LIQD Take 237  mLs by mouth 2 (two) times daily between meals.  . folic acid (FOLVITE) 1 MG tablet Take 1 tablet (1 mg total) by mouth daily.  . sodium chloride (OCEAN) 0.65 % SOLN nasal spray Place 1 spray into both nostrils as needed (moisture).  . thiamine 100 MG tablet Take 1 tablet (100 mg total) by mouth daily.  . metoprolol tartrate (LOPRESSOR) 25 MG tablet Take 1 tablet (25 mg total) by mouth 2 (two) times daily. (Patient not taking: Reported on 09/17/2015)   No facility-administered encounter medications on file as of 09/17/2015.    Functional Status:   In your present state of health, do you have any difficulty performing the following activities: 09/17/2015 08/17/2015  Hearing? Y N  Vision? N N  Difficulty concentrating or making decisions? N Y  Walking or climbing stairs? Y Y  Dressing or bathing? N N  Doing errands, shopping? [name]  Preparing Food and eating ? N -  Using the Toilet? N -  In the past six months, have you accidently leaked urine? N -  Do you have problems with loss of bowel control? N -  Managing your Medications? N -  Managing your Finances? N -  Housekeeping or managing your Housekeeping? Y -    Fall/Depression Screening:    PHQ 2/9 Scores 09/17/2015 07/20/2015 06/29/2015 06/09/2015 03/30/2015 02/05/2015 01/28/2015  PHQ - 2 Score 0 0 0 0 0 0 0   Fall Risk  09/17/2015 07/20/2015 06/29/2015 06/16/2015 04/19/2015  Falls in the past  year? Yes - Yes Yes -  Number falls in past yr: 2 or more - 2 or more 2 or more -  Injury with Fall? Yes - Yes Yes -  Risk Factor Category  High Fall Risk - High Fall Risk - -  Risk for fall due to : Impaired balance/gait;History of fall(s);Impaired mobility - History of fall(s);Impaired mobility History of fall(s);Impaired balance/gait;Impaired mobility History of fall(s);Impaired balance/gait;Impaired mobility  Risk for fall due to (comments): - - - - -  Follow up Falls evaluation completed;Falls prevention discussed Falls prevention discussed Falls  prevention discussed Falls prevention discussed Falls prevention discussed   Assessment:  Routine home visit. Older home, uneven floors and scatter rugs noted. Patient wearing her life alert button. Alyssa Ortega reports she goes to her bathroom on back porch during the day, and uses her bedside commode during the day, that Ortega assist her with emptying.    Fall - High fall risk screen, patient using her cane during visit.  Medication -  Alyssa Ortega report taking medication prescribed at discharge from SNF , that includes, folic acid and thiamine and tylenol as the only medication that she is taking. Medication is on her bed chairside table. Patient was recently discharged from hospital and all medications have been reviewed.   Nutrition - patient report recent weight is 122 at home, noted weight  6/12 was 127. Reinforced to use ensure to supplement meals, discussed protein rich foods with patient, patient discussed foods that she and what she ate on yesterday, tuna with crackers on yesterday. Discussed with patient eating small meals, nutrition  snacks.  Alyssa Ortega  denies constipation, no vomiting states nausea at times. Patient has had one ensure on this morning per her report. Alyssa Ortega is interested in receiving [employer] well dine meals.    Alyssa Ortega has appointment with PCP on 7/25, placed call to Alyssa Ortega, Alyssa Ortega, HIPAA verified to discuss patient transportation to appointment, Alyssa Ortega confirmed that she plans to attend office visit with patient.  Alyssa Ortega also states she makes sure that patient has food she can easily get to eat, and ensure. Alyssa Ortega states she assist with some housekeeping at patient's home.  Alyssa Ortega provided with Advanced directive packet ,explained  packet content .  I asked if she needed assistance with completing she states no, that P[place] will look at it and patient states she has someone that can notarize form.   Plan:  Will continue weekly  outreaches are part of the transition of care program, telephonic outreach scheduled within the next week.  Will provide review  EMMI  handout on preventing falls and nutrition healthy eating.  Will contact [facility] for post hospital meal program.  Will send visit note to PCP.   THN CM Care Plan Problem One        Most Recent Value   Care Plan Problem One  Knowledge deficit related to fall prevention measures as evidenced by recent hospital and rehab  admission                            Role Documenting the Problem One  Care Management [place] for Problem One  Active   THN Long Term Goal (31-90 days)  Patient will not experience hospital admission in the next 31 days   THN Long Term Goal Start Date  09/10/15   Interventions for Problem One Long Term Goal  HOme visited completed   [facility] CM  Short Term Goal #1 (0-30 days)  Patient will verbalize using her cane at all times the next 30 days   THN CM Short Term Goal #1 Start Date  09/10/15   Interventions for Short Term Goal #1  Encourage patient to continue to use her cane for stabllity to help with fall prevention, provided/reviewed EMMI handout on preventing fall along with reviewing [facility] handout on safety at home.     THN CM Short Term Goal #2 (0-30 days)  Patient will report taking medication daily as prescribed in the next 30 days   THN CM Short Term Goal #2 Start Date  09/10/15   Interventions for Short Term Goal #2  Reinforced with patient importance of taking medication as prescribed    THN CM Short Term Goal #3 (0-30 days)  Patient will atttend PCP appointment in the next 7 days   THN CM Short Term Goal #3 Start Date  09/14/15   Interventions for Short Tern Goal #3  Verified that patient Ortega will provide transportaiton to [place], RN, [place] Management [phone]- Mobile [phone]- [place]

## 2015-09-20 ENCOUNTER — Telehealth: Payer: Self-pay | Admitting: Family Medicine

## 2015-09-20 ENCOUNTER — Ambulatory Visit: Payer: Commercial Managed Care - HMO | Admitting: Family Medicine

## 2015-09-20 NOTE — Telephone Encounter (Signed)
Spoke with patient's daughter,patient states that she feels like she is having a nervous breakdown and that she needs help.  Patient wants to go back to the nursing home. Informed patient's daughter to go ahead and take her to the ER, she states that she doesn't have a car, so I advised her to call 911.  Explained to the daughter that patient can get a physical and mental evaluation while there to check and make sure that she does not have an underlying issue causing the feeling of a nervous breakdown, altered mental status. Patient's daughter agreed and will call 911.

## 2015-09-20 NOTE — Telephone Encounter (Signed)
Alyssa Ortega(pts son) called and would like for PCP to call his sister who is the pts POA. Alyssa Ortega [phone]. He stated that the pt felt like she may be having a nervous breakdown.

## 2015-09-21 ENCOUNTER — Other Ambulatory Visit: Payer: Self-pay | Admitting: *Deleted

## 2015-09-21 NOTE — Patient Outreach (Signed)
[place] [facility]) Care Management  [id number]  Alyssa Ortega [id number]    Transition of care   RN placed call to [name] , HIPAA verified . Patient voiced being upset yesterday and went to stay at her friends house during the day, she reports that she feels better today. [name] discussed that she does not want anyone talking to her about leaving her home, she wants to remain in home.  Patient discussed that she cancelled her appointment at PCP on yesterday also her home health nurse visit. [name] states she has rescheduled her appointment with PCP until 8/4 and her daughter or friend will provide transportation. Patient discussed that the home health nurse will visit on 7/26 at 0900.  [name] states she was boiling some eggs to make a salad, states she has not had ensure but she has plenty of ensure and food.  Encouraged patient regarding, how nutrition rich foods help to give her strength patient states she is aware. States she has had cappuccino today.  Patient reports taking her medication , "I am taking it all like they said".  [name] denies any new concerns at this time,   Plan Will plan weekly outreach call, within next week as part of the transition of care program. Patient will attend PCP office visit.    THN CM Care Plan Problem One   Flowsheet Row Most Recent Value  Care Plan Problem One  (P) Knowledge deficit related to fall prevention measures as evidenced by recent hospital and rehab  admission                           Role Documenting the Problem One  (P) Care Management [facility] for Problem One  (P) Active  THN Long Term Goal (31-90 days)  (P) Patient will not experience hospital admission in the next 31 days  THN Long Term Goal Start Date  (P) 09/10/15  Interventions for Problem One Long Term Goal  (P) Reinforced weekly outreach calls, and to notify MD , RN of any new concerns   THN CM Short Term Goal #1  (0-30 days)  (P) Patient will verbalize using her cane at all times the next 30 days  THN CM Short Term Goal #1 Start Date  (P) 09/10/15  Interventions for Short Term Goal #1  (P) Reinforced with  patient to continue to use her cane for stabllity to help with fall prevention, provided/reviewed EMMI handout on preventing fall along with reviewing [facility] handout on safety at home.    THN CM Short Term Goal #2 (0-30 days)  (P) Patient will report taking medication daily as prescribed in the next 30 days  THN CM Short Term Goal #2 Start Date  (P) 09/10/15  Interventions for Short Term Goal #2  (P) Encouraged patient to continue to take her medicatons as prescribed.   THN CM Short Term Goal #3 (0-30 days)  (P) Patient will atttend PCP appointment in the next 7 days  THN CM Short Term Goal #3 Start Date  (P) 09/14/15  Interventions for Short Tern Goal #3  (P) Patient has rescheduled PCP visit, discussed transportation to visit.   THN CM Short Term Goal #4 (0-30 days)  (P) Patient will report drinking at least 1 ensure a day to supplement meals in the next 30 days   THN CM Short Term Goal #4 Start Date  (P) 09/21/15  Interventions for Short Term Goal #4  (  P) Discussed with patient the importance of nutrition to help with strenght, discussed beneifits of nutrition drinks.      Joylene Draft, RN, [place] Management [phone]- Mobile [phone]- Toll Free Main Office

## 2015-09-29 ENCOUNTER — Telehealth: Payer: Self-pay | Admitting: Family Medicine

## 2015-09-29 ENCOUNTER — Other Ambulatory Visit: Payer: Self-pay | Admitting: *Deleted

## 2015-09-29 NOTE — Telephone Encounter (Signed)
Ebony Hail from [facility] called stated she would like verbal orders for nursing once a week for 6 weeks. Pt has been discharged from [facility]. Please call [facility] ASAP. Thanks.

## 2015-09-29 NOTE — Patient Outreach (Signed)
[facility]) Care Management  [id number]  SALVADOR ALOI [id number]   Transition of care call  RN placed call to patient, HIPAA verified. Mrs.Rieger reports she is doing pretty good just a little tired on today.  Patient reports home health therapy is scheduled for this week. Mrs.Ballweg states she has had ensure to drink on today, and plan to eat something later on , emphasized that she had plenty in the house to eat.   Mrs.Rod is able to recall that she has an appointment with PCP on this Friday and that her friend [name] will provide transportation. Patient plans to take her all her medication to visit.   Mrs.Naill discussed a episode when her nerves got all tore up, related to having a conversation with someone regarding moving out of her home to rehab, patient again states she desires to remain in her home and will talk to PCP at visit.  Mrs.Sparger denies having a recent fall, reinforced using her cane in the home.   Plan Will continue with transition of care program, weekly outreach by telephone in the next week.     THN CM Care Plan Problem One   Flowsheet Row Most Recent Value  Care Plan Problem One  Knowledge deficit related to fall prevention measures as evidenced by recent hospital and rehab  admission                           Role Documenting the Problem One  Care Management [place] for Problem One  Active  THN Long Term Goal (31-90 days)  Patient will not experience hospital admission in the next 31 days  THN Long Term Goal Start Date  09/10/15  Interventions for Problem One Long Term Goal  Encourage patient regarding participation in home health therapy sessions and exercises taught.   THN CM Short Term Goal #1 (0-30 days)  Patient will verbalize using her cane at all times the next 30 days  THN CM Short Term Goal #1 Start Date  09/10/15  Interventions for Short Term Goal #1  Discussed with patient in the  importance of using cane to help with balance and fall prevention   THN CM Short Term Goal #2 (0-30 days)  Patient will report taking medication daily as prescribed in the next 30 days  THN CM Short Term Goal #2 Start Date  09/10/15  Interventions for Short Term Goal #2  discussed importance of taking medication, patient will discuss with MD at visit,and take her medication   THN CM Short Term Goal #3 (0-30 days)  Patient will atttend PCP appointment in the next 7 days  THN CM Short Term Goal #3 Start Date  09/14/15  Interventions for Short Tern Goal #3  Has appointment on 8/4, able to teachback, verified that patient has transportation.    THN CM Short Term Goal #4 (0-30 days)  Patient will report drinking at least 1 ensure a day to supplement meals in the next 30 days   THN CM Short Term Goal #4 Start Date  09/21/15  Interventions for Short Term Goal #4  Encouraged regarding ensure and how nutrition in foods helps with increasing strength      Joylene Draft, RN, [place] Management [phone]- Mobile [phone]- [place] Office

## 2015-09-29 NOTE — Telephone Encounter (Signed)
Verbal ok from [place].  Called and gave order

## 2015-10-01 ENCOUNTER — Ambulatory Visit: Payer: Commercial Managed Care - HMO | Admitting: Family Medicine

## 2015-10-06 ENCOUNTER — Other Ambulatory Visit: Payer: Self-pay | Admitting: *Deleted

## 2015-10-06 NOTE — Patient Outreach (Signed)
[place]) Care Management  [id number]  ZANITA REDBIRD [id number]   Unsuccessful attempt to contact Mrs.Claiborne by telephone on today at 1217 and 1616, phone rang unable to leave a message.   Plan Will place on schedule to place transition of care call for the next day.   Joylene Draft, RN, [place] Management [phone]- Mobile [phone]- Toll Free Main Office

## 2015-10-07 ENCOUNTER — Other Ambulatory Visit: Payer: Self-pay | Admitting: *Deleted

## 2015-10-07 NOTE — Patient Outreach (Signed)
[facility]) Care Management  [id number]  ABYGAEL ZOZAYA [id number]  Transition of care call to Mrs.[name], HIPAA verified.  Mrs.Losier discussed that she has pain in her back or hip on today, and that she has been trying to keep her feet elevated on today, because she has some swelling in her ankles.  Patient reports that she has taken one tylenol on today for the discomfort but she hasn't taken her other medications yet but she has them on the table next to her.  Mrs.Degregorio discussed she has been able to wash clothes on today, states she does a little bit at a time, she continues to use her cane, she states she is able to walk to bathroom on her back porch but uses her potty chair during the night, denies having a fall. Patient discussed that home health therapy has visited her on this week but she didn't feel like walking outside.  When asked patient how her appetite was and what she had eaten today , she immediately discussed that she has plenty of food and ensure, unable to determine if she has eaten today she focused on what she had to eat later today.  Mrs.Youman has PCP visit on 8/15 states she had to change appointment from last week, offered to assist with arranging transportation to appointment she states her friend [name] would take her .    Plan Patient will continue to remain active with transition of care program, weekly telephone visit scheduled for next week. Patient will attend PCP office visit, and notify MD of any new concerns.  THN CM Care Plan Problem One   Flowsheet Row Most Recent Value  Care Plan Problem One  Knowledge deficit related to fall prevention measures as evidenced by recent hospital and rehab  admission                           Role Documenting the Problem One  Care Management [place] for Problem One  Active  THN Long Term Goal (31-90 days)  Patient will not experience hospital admission in the next  31 days  THN Long Term Goal Start Date  09/10/15  Interventions for Problem One Long Term Goal  Reinforced with patient weekly outreaches will continue , reinforced importance of taking medication, following up  PCP with new concerns.  THN CM Short Term Goal #1 (0-30 days)  Patient will verbalize using her cane at all times the next 30 days  THN CM Short Term Goal #1 Start Date  09/10/15  Interventions for Short Term Goal #1  Reinforced use of cane to prevent falls, reviewed fall preventions measures, rising slowly, standing still a few seconds before starting to walk, keeping a light on at night,   THN CM Short Term Goal #2 (0-30 days)  Patient will report taking medication daily as prescribed in the next 30 days  THN CM Short Term Goal #2 Start Date  09/10/15  Interventions for Short Term Goal #2  Encouraged patient to take her tylenol as needed and recommended by MD for pain.   THN CM Short Term Goal #3 (0-30 days)  Patient will atttend PCP appointment in the next 7 days  THN CM Short Term Goal #3 Start Date  09/14/15  Interventions for Short Tern Goal #3  Reinforced importance of attending PCP visit, patient has changed appointment until 8/5 and verifies her friend will provide transportation.   THN  CM Short Term Goal #4 (0-30 days)  Patient will report drinking at least 1 ensure a day to supplement meals in the next 30 days   THN CM Short Term Goal #4 Start Date  09/21/15  Interventions for Short Term Goal #4  Reinforced importance of nutrition and supplementing the food that she does eat with ensure.     Joylene Draft, RN, [place] Management [phone] Mobile [phone]- Toll Free Main Office

## 2015-10-12 ENCOUNTER — Ambulatory Visit (INDEPENDENT_AMBULATORY_CARE_PROVIDER_SITE_OTHER): Payer: Commercial Managed Care - HMO | Admitting: Family Medicine

## 2015-10-12 ENCOUNTER — Encounter: Payer: Self-pay | Admitting: Family Medicine

## 2015-10-12 DIAGNOSIS — I10 Essential (primary) hypertension: Secondary | ICD-10-CM | POA: Diagnosis not present

## 2015-10-12 DIAGNOSIS — R627 Adult failure to thrive: Secondary | ICD-10-CM

## 2015-10-12 DIAGNOSIS — J449 Chronic obstructive pulmonary disease, unspecified: Secondary | ICD-10-CM | POA: Diagnosis not present

## 2015-10-12 MED ORDER — ALBUTEROL SULFATE HFA 108 (90 BASE) MCG/ACT IN AERS: 2.0000 | INHALATION_SPRAY | Freq: Four times a day (QID) | RESPIRATORY_TRACT | 1 refills | Status: AC | PRN

## 2015-10-12 NOTE — Progress Notes (Signed)
BP 132/82 (BP Location: Left Arm, Patient Position: Sitting, Cuff Size: Normal)   Pulse 98   Temp 98.3 F (36.8 C)   Wt 121 lb (54.9 kg)   LMP  (LMP Unknown)   SpO2 96%   BMI 20.77 kg/m    Subjective:    Patient ID: Alyssa Ortega, female    DOB: [birth year], 82 y.o.   MRN: [id number]  HPI: Alyssa Ortega is a 82 y.o. female  Chief Complaint  Patient presents with  . Edema   [name] is here today for follow up. She states that she is doing well. She does not like her PT right now. She wants Gerald Stabs back. She doesn't like that they come early and make her feel bad. She feels very tired. Getting short of breath when she walks around her house. Not using any daily medicine due to non-compliance. She doesn't like the home health people either. Has friends who help take care of her. States that she is doing well. She does note that her son was recently diagnosed with cancer. Very anxious about that. Otherwise doing well with no other concerns or complaints at this time.   Relevant past medical, surgical, family and social history reviewed and updated as indicated. Interim medical history since our last visit reviewed. Allergies and medications reviewed and updated.  Review of Systems  Constitutional: Negative.   Respiratory: Positive for shortness of breath and wheezing. Negative for apnea, cough, choking, chest tightness and stridor.   Cardiovascular: Positive for leg swelling. Negative for chest pain and palpitations.  Psychiatric/Behavioral: Negative.     Per HPI unless specifically indicated above     Objective:    BP 132/82 (BP Location: Left Arm, Patient Position: Sitting, Cuff Size: Normal)   Pulse 98   Temp 98.3 F (36.8 C)   Wt 121 lb (54.9 kg)   LMP  (LMP Unknown)   SpO2 96%   BMI 20.77 kg/m   Wt Readings from Last 3 Encounters:  10/12/15 121 lb (54.9 kg)  08/17/15 126 lb 9.6 oz (57.4 kg)  08/09/15 127 lb (57.6 kg)    Physical Exam  Constitutional: She is  oriented to person, place, and time. She appears well-developed and well-nourished. No distress.  HENT:  Head: Normocephalic and atraumatic.  Right Ear: Hearing normal.  Left Ear: Hearing normal.  Nose: Nose normal.  Eyes: Conjunctivae and lids are normal. Right eye exhibits no discharge. Left eye exhibits no discharge. No scleral icterus.  Cardiovascular: Normal rate, regular rhythm, normal heart sounds and intact distal pulses.  Exam reveals no gallop and no friction rub.   No murmur heard. Pulmonary/Chest: Effort normal. No respiratory distress. She has wheezes. She has no rales. She exhibits no tenderness.  Musculoskeletal: Normal range of motion.  Neurological: She is alert and oriented to person, place, and time.  Skin: Skin is warm, dry and intact. No rash noted. She is not diaphoretic. No erythema. No pallor.  Psychiatric: She has a normal mood and affect. Her speech is normal and behavior is normal. Judgment and thought content normal. Cognition and memory are normal.  Nursing note and vitals reviewed.   Results for orders placed or performed during the hospital encounter of 08/17/15  Urinalysis complete, with microscopic [facility] only)  Result Value Ref Range   Color, Urine STRAW (A) YELLOW   APPearance CLEAR (A) CLEAR   Glucose, UA NEGATIVE NEGATIVE mg/dL   Bilirubin Urine NEGATIVE NEGATIVE   Ketones, ur TRACE (A) NEGATIVE  mg/dL   Specific Gravity, Urine 1.003 (L) 1.005 - 1.030   Hgb urine dipstick NEGATIVE NEGATIVE   pH 6.0 5.0 - 8.0   Protein, ur NEGATIVE NEGATIVE mg/dL   Nitrite NEGATIVE NEGATIVE   Leukocytes, UA NEGATIVE NEGATIVE   RBC / HPF 0-5 0 - 5 RBC/hpf   WBC, UA 0-5 0 - 5 WBC/hpf   Bacteria, UA NONE SEEN NONE SEEN   Squamous Epithelial / LPF NONE SEEN NONE SEEN  CBC with Differential  Result Value Ref Range   WBC 9.0 3.6 - 11.0 K/uL   RBC 4.34 3.80 - 5.20 MIL/uL   Hemoglobin 14.0 12.0 - 16.0 g/dL   HCT 39.8 35.0 - 47.0 %   MCV 91.7 80.0 - 100.0 fL   MCH  32.2 26.0 - 34.0 pg   MCHC 35.1 32.0 - 36.0 g/dL   RDW 15.3 (H) 11.5 - 14.5 %   Platelets 129 (L) 150 - 440 K/uL   Neutrophils Relative % 73 %   Neutro Abs 6.5 1.4 - 6.5 K/uL   Lymphocytes Relative 19 %   Lymphs Abs 1.7 1.0 - 3.6 K/uL   Monocytes Relative 8 %   Monocytes Absolute 0.7 0.2 - 0.9 K/uL   Eosinophils Relative 0 %   Eosinophils Absolute 0.0 0 - 0.7 K/uL   Basophils Relative 0 %   Basophils Absolute 0.0 0 - 0.1 K/uL  Basic metabolic panel  Result Value Ref Range   Sodium 122 (L) 135 - 145 mmol/L   Potassium 4.2 3.5 - 5.1 mmol/L   Chloride 86 (L) 101 - 111 mmol/L   CO2 24 22 - 32 mmol/L   Glucose, Bld 83 65 - 99 mg/dL   BUN <5 (L) 6 - 20 mg/dL   Creatinine, Ser 0.46 0.44 - 1.00 mg/dL   Calcium 8.5 (L) 8.9 - 10.3 mg/dL   GFR calc non Af Amer >60 >60 mL/min   GFR calc Af Amer >60 >60 mL/min   Anion gap 12 5 - 15  Troponin I  Result Value Ref Range   Troponin I <0.03 <0.031 ng/mL  Ethanol  Result Value Ref Range   Alcohol, Ethyl (B) 34 (H) <5 mg/dL  Basic metabolic panel  Result Value Ref Range   Sodium 125 (L) 135 - 145 mmol/L   Potassium 3.7 3.5 - 5.1 mmol/L   Chloride 90 (L) 101 - 111 mmol/L   CO2 25 22 - 32 mmol/L   Glucose, Bld 77 65 - 99 mg/dL   BUN <5 (L) 6 - 20 mg/dL   Creatinine, Ser 0.39 (L) 0.44 - 1.00 mg/dL   Calcium 8.4 (L) 8.9 - 10.3 mg/dL   GFR calc non Af Amer >60 >60 mL/min   GFR calc Af Amer >60 >60 mL/min   Anion gap 10 5 - 15  Basic metabolic panel  Result Value Ref Range   Sodium 131 (L) 135 - 145 mmol/L   Potassium 3.0 (L) 3.5 - 5.1 mmol/L   Chloride 96 (L) 101 - 111 mmol/L   CO2 27 22 - 32 mmol/L   Glucose, Bld 87 65 - 99 mg/dL   BUN 6 6 - 20 mg/dL   Creatinine, Ser 0.39 (L) 0.44 - 1.00 mg/dL   Calcium 8.6 (L) 8.9 - 10.3 mg/dL   GFR calc non Af Amer >60 >60 mL/min   GFR calc Af Amer >60 >60 mL/min   Anion gap 8 5 - 15  Magnesium  Result Value Ref Range  Magnesium 1.7 1.7 - 2.4 mg/dL      Assessment & Plan:   Problem List  Items Addressed This Visit      Cardiovascular and Mediastinum   Benign essential HTN    Patient will not take medication. BP stable. Avoid ETOH. Continue to monitor.         Respiratory   COPD (chronic obstructive pulmonary disease) (HCC)    Not doing great. Will not take daily medicine. Will refill albuterol to take PRN. Recheck 3 months. Call with concerns.       Relevant Medications   albuterol (PROVENTIL HFA;VENTOLIN HFA) 108 (90 Base) MCG/ACT inhaler     Other   Adult failure to thrive    Continues to lose weight. Discussed that she needs to eat. Using ensure. Continue current regimen and continue to monitor.        Other Visit Diagnoses   None.      Follow up plan: Return in about 3 months (around 01/12/2016).

## 2015-10-12 NOTE — Assessment & Plan Note (Signed)
Continues to lose weight. Discussed that she needs to eat. Using ensure. Continue current regimen and continue to monitor.

## 2015-10-12 NOTE — Assessment & Plan Note (Signed)
Patient will not take medication. BP stable. Avoid ETOH. Continue to monitor.

## 2015-10-12 NOTE — Assessment & Plan Note (Signed)
Not doing great. Will not take daily medicine. Will refill albuterol to take PRN. Recheck 3 months. Call with concerns.

## 2015-10-13 ENCOUNTER — Other Ambulatory Visit: Payer: Self-pay | Admitting: *Deleted

## 2015-10-13 DIAGNOSIS — J441 Chronic obstructive pulmonary disease with (acute) exacerbation: Secondary | ICD-10-CM

## 2015-10-13 DIAGNOSIS — I10 Essential (primary) hypertension: Secondary | ICD-10-CM

## 2015-10-13 NOTE — Patient Outreach (Signed)
[place] [facility]) Care Management  [id number]  RASHAUNDA ROEVER [id number]   Transition of care call  Telephone call placed to Mrs.[name] , HIPAA verified , patient reports that she is feeling tired and aching in back  today, states she has taken tylenol once today. Mrs.Paino discussed her visit to PCP on yesterday and the new medication started for her lungs,and inhaler, patient states she sent her she cannot afford the $55 co payment. Mrs.Mcdonell denies shortness of breath at present as she is resting in her chair.  Mrs.Camille states she is being followed still by home health physical therapy and nurse she states they are nice , but sometimes she doesn't feel like walking outside.   Patient states her weight is down and she has lost 6 pounds, patient states she has been eating ritz crackers on today,and diet coke she didn't want any peanut butter. Discussed with patient importance of eating , balanced meals supplementing ensure to help with preventing weight loss.  Mrs.Schuelke states she has plenty food and she has ensure at home , just doesn't have a appetite at times.  Patient has completed transition of care , but will benefit from continued community care nurse involvement , high risk for readmission.  Plan Will notify PCP office of patient unable to afford albuterol inhaler Will place [facility] consult regarding cost of medication concerns. Will continue to follow patient in community, will place follow up telephone call on next week and then schedule next home visit.     Joylene Draft, RN, [place] Management [phone]- Mobile [phone]- Toll Free Main Office

## 2015-10-13 NOTE — Patient Outreach (Signed)
[place] [facility]) Care Management  [id number]  AHYANNA HEMMERLING [id number]   Care Coordination  Telephone call placed to [facility] , PCP to inform of patient concern related to cost of Albuterol inhaler, $55 and patient states she is unable to afford . I was able to speak with Claiborne Billings, no samples available, will look for discount card and follow up with me .  Plan Will place discuss concern regarding cost of albuterol with [facility] pharmacist and place consult as  needed.   Joylene Draft, RN, [place] Management [phone]- Mobile [phone]- Toll Free Main Office

## 2015-10-14 ENCOUNTER — Other Ambulatory Visit: Payer: Self-pay | Admitting: *Deleted

## 2015-10-14 NOTE — Patient Outreach (Signed)
[place] [facility]) Care Management  [id number]  BATOOL REASONOVER [id number]  Care Coordination  Incoming call from [place] at [facility], PCP office, states she has been able to find coupon for $15 of proventil inhaler for Alyssa Ortega.  I placed call to inform Alyssa Ortega of coupon for $15 dollars that she may be able to use toward the prescription, Patient states she would not have additional money until September. Patient states she will not be home later today, she is going to friend home, patient denies shortness of breath.   Plan Will pick up coupon from office within the week [facility] pharmacist consulted regarding situation.   Joylene Draft, RN, [place] Management [phone]- Mobile [phone]- Toll Free Main Office

## 2015-10-20 ENCOUNTER — Ambulatory Visit: Payer: Commercial Managed Care - HMO | Admitting: *Deleted

## 2015-10-21 ENCOUNTER — Other Ambulatory Visit: Payer: Self-pay | Admitting: *Deleted

## 2015-10-21 NOTE — Patient Outreach (Signed)
[place] [facility]) Care Management  [id number]  RICHELLA CARAS [id number]   Unsuccessful follow up  call to Mrs.Sofia on today, unable to leave a message.    Plan Will plan follow up  call in a week and schedule next community care coordinator home visit for September.   Joylene Draft, RN, [place] Management [phone]- Mobile [phone]- Toll Free Main Office

## 2015-10-27 ENCOUNTER — Other Ambulatory Visit: Payer: Self-pay | Admitting: *Deleted

## 2015-10-27 NOTE — Patient Outreach (Addendum)
[place] [facility]) Care Management  [id number]  Alyssa Ortega [id number]  Unsuccessful attempt to contact ,for continued community care management assistance, attempted call at 1130 and 1430 on today,  No answer unable to leave a message.   Plan to attempt contact with patient in next 2 days.  Joylene Draft, RN, [place] Management [phone]- Mobile [phone]- Toll Free Main Office

## 2015-10-29 ENCOUNTER — Other Ambulatory Visit: Payer: Self-pay | Admitting: *Deleted
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
## 2015-11-02 ENCOUNTER — Other Ambulatory Visit: Payer: Self-pay | Admitting: *Deleted

## 2015-11-02 NOTE — Patient Outreach (Signed)
[facility]) Care Management  [id number]  BERKLI GARNTO [id number]   Telephone call to Mrs.Chaney , to arrange community care coordinator home visit.. No answer, unable to leave a message.    Plan Will attempt contact in the next week, if unable to make contact directly with patient, will attempt contact with Mrs.Reza's emergency contact on [facility] consent. Will discuss with Mrs.Dauria continued involvement with [facility] care management, if no longer interested in program, or unable to contact will close case per protocol.   Joylene Draft, RN, [place] Management [phone]- Mobile [phone]- Toll Free Main Office

## 2015-11-05 ENCOUNTER — Other Ambulatory Visit: Payer: Self-pay | Admitting: Pharmacist

## 2015-11-05 NOTE — Patient Outreach (Signed)
[facility]) Care Management  [id number]  Alyssa Ortega [id number]  82 year old female referred to [place] for medication assistance due to difficulty affording $55 co-payment for Albuterol inhaler.  Called patient to discuss but was unable to reach patient and unable to leave voicemail to return call. Per [facility] RN note patients primary care provider was able to find a $15 coupon for albuterol inhaler.   Plan: -Telephone call to assess need for medication assistance next week  -Will followup $15 coupon from pharmacy as coupons are usually not allowed with medicare part D insurance  Bennye Alm, PharmD [facility] PGY2 Pharmacy Resident [phone]

## 2015-11-08 ENCOUNTER — Other Ambulatory Visit: Payer: Self-pay | Admitting: *Deleted

## 2015-11-08 ENCOUNTER — Ambulatory Visit: Payer: Self-pay | Admitting: Pharmacist

## 2015-11-08 ENCOUNTER — Other Ambulatory Visit: Payer: Self-pay | Admitting: Pharmacist

## 2015-11-08 NOTE — Patient Outreach (Addendum)
[place] [facility]) Care Management  [id number]  POONAM PHILLIPPI [id number]  Unsuccessful attempt to contact Mrs.Capers by telephone , unable to leave a message.  [place] call to Mrs.Vincents'daughter Sharol Given, listed on [facility] consent, no answer able to leave a HIPAA compliant message with return contact information.  Plan  Will await return telephone call, to discuss best way to contact Mrs.Nachtman to continue with [facility] care management services.  If no return call will attempt to contact patient within the next 4 days.  Joylene Draft, RN, [place] Management [phone]- Mobile [phone]- Toll Free Main Office

## 2015-11-08 NOTE — Patient Outreach (Signed)
[place] [facility]) Care Management  [id number]  NEELI WHITLING [id number]   82 year old female referred to [place] for medication assistance due to difficulty affording $55 co-payment for Albuterol inhaler.  Per [facility] RN note patients primary care provider was able to find a $15 coupon for albuterol inhaler. On second attempt to reach patient, called to discuss medication assistance but was unable to reach patient and unable to leave voicemail to return call.   Plan:  -Will reach out to [facility]  RN to see how to best contact patient -Telephone call to assess need for medication assistance next week  -Will followup $15 coupon from pharmacy as coupons are usually not allowed with medicare part D insurance  Bennye Alm, PharmD [facility] PGY2 Pharmacy Resident [phone]

## 2015-11-10 ENCOUNTER — Ambulatory Visit: Payer: Self-pay | Admitting: Pharmacist

## 2015-11-11 ENCOUNTER — Other Ambulatory Visit: Payer: Self-pay | Admitting: *Deleted
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
## 2015-11-12 ENCOUNTER — Emergency Department: Payer: Medicare HMO

## 2015-11-12 ENCOUNTER — Encounter: Payer: Self-pay | Admitting: *Deleted

## 2015-11-12 ENCOUNTER — Emergency Department
Admission: EM | Admit: 2015-11-12 | Discharge: 2015-11-12 | Disposition: A | Discharge status: Home / Self Care | Payer: Medicare HMO | Attending: Emergency Medicine | Admitting: Emergency Medicine

## 2015-11-12 DIAGNOSIS — Z79899 Other long term (current) drug therapy: Secondary | ICD-10-CM | POA: Diagnosis not present

## 2015-11-12 DIAGNOSIS — Z87891 Personal history of nicotine dependence: Secondary | ICD-10-CM | POA: Insufficient documentation

## 2015-11-12 DIAGNOSIS — R531 Weakness: Secondary | ICD-10-CM

## 2015-11-12 DIAGNOSIS — I10 Essential (primary) hypertension: Secondary | ICD-10-CM | POA: Diagnosis not present

## 2015-11-12 DIAGNOSIS — J449 Chronic obstructive pulmonary disease, unspecified: Secondary | ICD-10-CM | POA: Insufficient documentation

## 2015-11-12 DIAGNOSIS — N390 Urinary tract infection, site not specified: Secondary | ICD-10-CM | POA: Insufficient documentation

## 2015-11-12 LAB — URINALYSIS COMPLETE WITH MICROSCOPIC (ARMC ONLY)
BILIRUBIN URINE: NEGATIVE
GLUCOSE, UA: NEGATIVE mg/dL
HGB URINE DIPSTICK: NEGATIVE
KETONES UR: NEGATIVE mg/dL
NITRITE: NEGATIVE
PH: 6 (ref 5.0–8.0)
Protein, ur: NEGATIVE mg/dL
SPECIFIC GRAVITY, URINE: 1.009 (ref 1.005–1.030)

## 2015-11-12 LAB — CBC WITH DIFFERENTIAL/PLATELET
BASOS ABS: 0 10*3/uL (ref 0–0.1)
BASOS PCT: 0 %
EOS ABS: 0 10*3/uL (ref 0–0.7)
EOS PCT: 0 %
HCT: 39.2 % (ref 35.0–47.0)
Hemoglobin: 13.6 g/dL (ref 12.0–16.0)
LYMPHS PCT: 18 %
Lymphs Abs: 1.3 10*3/uL (ref 1.0–3.6)
MCH: 32.7 pg (ref 26.0–34.0)
MCHC: 34.8 g/dL (ref 32.0–36.0)
MCV: 93.8 fL (ref 80.0–100.0)
MONO ABS: 0.6 10*3/uL (ref 0.2–0.9)
Monocytes Relative: 8 %
Neutro Abs: 5.4 10*3/uL (ref 1.4–6.5)
Neutrophils Relative %: 74 %
PLATELETS: 120 10*3/uL — AB (ref 150–440)
RBC: 4.17 MIL/uL (ref 3.80–5.20)
RDW: 15.2 % — AB (ref 11.5–14.5)
WBC: 7.4 10*3/uL (ref 3.6–11.0)

## 2015-11-12 LAB — BASIC METABOLIC PANEL
Anion gap: 10 (ref 5–15)
BUN: 9 mg/dL (ref 6–20)
CHLORIDE: 92 mmol/L — AB (ref 101–111)
CO2: 28 mmol/L (ref 22–32)
CREATININE: 0.53 mg/dL (ref 0.44–1.00)
Calcium: 9.1 mg/dL (ref 8.9–10.3)
GFR calc Af Amer: >60 mL/min (ref >60)
GFR calc non Af Amer: >60 mL/min (ref >60)
GLUCOSE: 114 mg/dL — AB (ref 65–99)
Potassium: 3.5 mmol/L (ref 3.5–5.1)
SODIUM: 130 mmol/L — AB (ref 135–145)

## 2015-11-12 LAB — TROPONIN I: Troponin I: <0.03 ng/mL (ref <0.03)

## 2015-11-12 MED ORDER — CEPHALEXIN 500 MG PO CAPS: 500.0000 mg | ORAL_CAPSULE | Freq: Two times a day (BID) | ORAL | 0 refills | Status: DC

## 2015-11-12 MED ORDER — DEXTROSE 5 % IV SOLN
1.0000 g | Freq: Once | INTRAVENOUS | Status: AC
  Administered 2015-11-12: 1 g via INTRAVENOUS
  Filled 2015-11-12: qty 10

## 2015-11-12 MED ORDER — SODIUM CHLORIDE 0.9 % IV BOLUS (SEPSIS)
1000.0000 mL | Freq: Once | INTRAVENOUS | Status: AC
  Administered 2015-11-12: 1000 mL via INTRAVENOUS

## 2015-11-12 NOTE — ED Notes (Signed)

## 2015-11-12 NOTE — ED Notes (Signed)
EDMD AT BEDSIDE

## 2015-11-12 NOTE — Discharge Instructions (Signed)
You were evaluated for generalized weakness and found to have a urinary tract infection. We discussed hospital admission overnight versus going home, and decided to go on home. You were given IV antibiotic of Rocephin here in the emergency department. Start your antibiotic Keflex tomorrow.   Return to the emergency room for any worsening condition including fever, confusion, weakness, abdominal pain, inability to urinate, or any other symptoms concerning to you. I'll very closely with your primary doctor, call on Monday for an appointment.

## 2015-11-12 NOTE — ED Notes (Signed)
Pt. Up to restroom with cane under this RN supervision. No complications. Pt. Resting back in bed comfortably at this time awaiting to go to XR.

## 2015-11-12 NOTE — ED Provider Notes (Signed)
[facility] Emergency Department Provider Note ____________________________________________   I have reviewed the triage vital signs and the triage nursing note.  HISTORY  Chief Complaint Weakness and Chest Pain   Historian Patient  HPI Alyssa Ortega is a 82 y.o. female who lives alone presented today because she was feeling weak this morning when she woke up. She states that she went over to the fire department because she knows the guys there and they told her she should go over to the ER just to get evaluated.  Denies abdominal pain. Denies fever or chills. Denies focal weakness.  No chest pain or coughing or trouble breathing.    Past Medical History:  Diagnosis Date  . Bursitis   . Dehydration   . Gastritis   . GI bleed 10/2007   secondary to duodenal ulcers  . Hip fracture ([place]) 3/09   left, s/p hemiarthroplasty  . History of epistaxis    had MRI by Dr. Carlis Abbott  . Hyperlipidemia   . Hypertension   . Insomnia   . Osteoporosis   . Rib fractures    traumatic    Patient Active Problem List   Diagnosis Date Noted  . Hyponatremia 08/17/2015  . Compression fracture of lumbar spine, non-traumatic 05/18/2015  . Adult failure to thrive 05/18/2015  . COPD (chronic obstructive pulmonary disease) ([place]) 04/15/2015  . Malnutrition ([place]) 03/18/2015  . Trochanteric bursitis of right hip 10/20/2014  . Benign essential HTN 08/05/2014  . Hypercholesterolemia without hypertriglyceridemia 08/05/2014  . Cellulitis and abscess of leg 07/01/2014  . Wedge fracture of lumbar vertebra ([place]) 09/30/2013  . Screening for breast cancer 12/22/2010  . Alcohol abuse, episodic 12/22/2010  . Weight loss, non-intentional 12/22/2010  . Tinnitus of left ear 12/22/2010  . Thrombocytopenia ([place]) 12/22/2010  . Osteoporosis with fracture   . Rib fractures   . History of epistaxis   . GI bleed 10/29/2007    Past Surgical History:  Procedure Laterality Date  . ABDOMINAL  HYSTERECTOMY    . BACK SURGERY    . HEMIARTHROPLASTY HIP  3/09, 10/11   left hip  . JOINT REPLACEMENT  2009   Left hip    Prior to Admission medications   Medication Sig Start Date End Date Taking? Authorizing Provider  acetaminophen (TYLENOL) 500 MG tablet Take 1 tablet (500 mg total) by mouth every 6 (six) hours as needed for mild pain, moderate pain, fever or headache. 08/18/15   Gladstone Lighter, MD  albuterol (PROVENTIL HFA;VENTOLIN HFA) 108 (90 Base) MCG/ACT inhaler Inhale 2 puffs into the lungs every 6 (six) hours as needed for wheezing or shortness of breath. 10/12/15   Megan P Johnson, DO  cephALEXin (KEFLEX) 500 MG capsule Take 1 capsule (500 mg total) by mouth 2 (two) times daily. 11/12/15   Lisa Roca, MD  feeding supplement, ENSURE ENLIVE, (ENSURE ENLIVE) LIQD Take 237 mLs by mouth 2 (two) times daily between meals. Patient not taking: Reported on 10/12/2015 07/03/14   Fritzi Mandes, MD    Allergies  Allergen Reactions  . Aspirin Hives and Other (See Comments)    Nosebleed per patient    Family History  Problem Relation Age of Onset  . Cancer Son 40    colon  . Arthritis Brother     Social History Social History  Substance Use Topics  . Smoking status: Former Smoker    Types: Cigarettes    Quit date: 12/21/1990  . Smokeless tobacco: Never Used  . Alcohol use Yes  Comment: occasional    Review of Systems  Constitutional: Negative for fever. Eyes: Negative for visual changes. ENT: Negative for sore throat. Cardiovascular: Negative for chest pain. Respiratory: Negative for shortness of breath. Gastrointestinal: Negative for abdominal pain, vomiting and diarrhea. Genitourinary: Negative for dysuria. Musculoskeletal: Negative for back pain. Skin: Negative for rash. Neurological: Negative for headache. 10 point Review of Systems otherwise negative ____________________________________________   PHYSICAL EXAM:  VITAL SIGNS: ED Triage Vitals [11/12/15  1422]  Enc Vitals Group     BP 101/79     Pulse Rate (!) 103     Resp 18     Temp 98.2 F (36.8 C)     Temp Source Oral     SpO2 94 %     Weight 122 lb (55.3 kg)     Height 5\' 2"  (1.575 m)     Head Circumference      Peak Flow      Pain Score 10     Pain Loc      Pain Edu?      Excl. in [place]?      Constitutional: Alert and oriented. Well appearing and in no distress. HEENT   Head: Normocephalic and atraumatic.      Eyes: Conjunctivae are normal. PERRL. Normal extraocular movements.      Ears:         Nose: No congestion/rhinnorhea.   Mouth/Throat: Mucous membranes are moist. No teeth.   Neck: No stridor. Cardiovascular/Chest: Normal rate, regular rhythm.  No murmurs, rubs, or gallops. Respiratory: Normal respiratory effort without tachypnea nor retractions. Breath sounds are clear and equal bilaterally. No wheezes/rales/rhonchi. Gastrointestinal: Soft. No distention, no guarding, no rebound. Nontender.   Genitourinary/rectal:Deferred Musculoskeletal: Nontender with normal range of motion in all extremities. No joint effusions.  No lower extremity tenderness.  No edema. Neurologic:  Normal speech and language. No gross or focal neurologic deficits are appreciated. Skin:  Skin is warm, dry and intact. No rash noted. Psychiatric: Mood and affect are normal. Speech and behavior are normal. Patient exhibits appropriate insight and judgment.   ____________________________________________  LABS (pertinent positives/negatives)  Labs Reviewed  BASIC METABOLIC PANEL - Abnormal; Notable for the following:       Result Value   Sodium 130 (*)    Chloride 92 (*)    Glucose, Bld 114 (*)    All other components within normal limits  CBC WITH DIFFERENTIAL/PLATELET - Abnormal; Notable for the following:    RDW 15.2 (*)    Platelets 120 (*)    All other components within normal limits  URINALYSIS COMPLETEWITH MICROSCOPIC ([facility] ONLY) - Abnormal; Notable for the following:     Color, Urine YELLOW (*)    APPearance HAZY (*)    Leukocytes, UA 3+ (*)    Bacteria, UA RARE (*)    Squamous Epithelial / LPF 0-5 (*)    All other components within normal limits  URINE CULTURE  CULTURE, BLOOD (ROUTINE X 2)  CULTURE, BLOOD (ROUTINE X 2)  TROPONIN I    ____________________________________________    EKG I, Lisa Roca, MD, the attending physician have personally viewed and interpreted all ECGs.  106 bpm. Sinus tachycardia. X deviation. Nonspecific T-wave ____________________________________________  RADIOLOGY All Xrays were viewed by me. Imaging interpreted by Radiologist.  Chest two-view: IMPRESSION: COPD and basilar scarring without superimposed finding. __________________________________________  PROCEDURES  Procedure(s) performed: None  Critical Care performed: None  ____________________________________________   ED COURSE / ASSESSMENT AND PLAN  Pertinent labs & imaging  results that were available during my care of the patient were reviewed by me and considered in my medical decision making (see chart for details).   Ms. Helm states that she just didn't feel like she had a lot of energy this morning and thought she would go stop by the fire department, but states that they told her to come over here. No reported fevers. In terms of a general workup, was started by triage based on some complaint of chest discomfort or shortness of breath, but for me she denies both of these.   She does have a urinary tract infection.  A couple of her blood pressures were documented in the 0000000 systolic, but whenever I'm in the room with the patient and check her blood pressure she is hypertensive up to the 140s, and so not sure if those were actually erroneous. She reports no fever and was not febrile here. She has had a heart rate between 90 and 110, mostly in the 90s. She states that she hasn't eaten very well in 24 hours. In any case, I gave her a dose of IV  Rocephin here. I discussed with her possibility of observation overnight with this questionable low blood pressure and heart rate, although again I really don't think that a low blood pressure is likely accurate. I have a very low suspicion that she has sepsis. Certainly if she had sepsis I would not recommend going home. She really wants to go home, and after blood cultures are drawn and a liter of fluid, she'll be discharged home with Keflex.  We discussed strict return precautions.    CONSULTATIONS:   None   Patient / Family / Caregiver informed of clinical course, medical decision-making process, and agree with plan.   I discussed return precautions, follow-up instructions, and discharge instructions with patient and/or family.   ___________________________________________   FINAL CLINICAL IMPRESSION(S) / ED DIAGNOSES   Final diagnoses:  Generalized weakness  UTI (lower urinary tract infection)              Note: This dictation was prepared with Dragon dictation. Any transcriptional errors that result from this process are unintentional    Lisa Roca, MD 11/12/15 2036

## 2015-11-12 NOTE — ED Triage Notes (Signed)
Pt arrives via EMS from home with complaints of generalized pain since last night, at present when asked where her pain is pt points to left chest area, pt awake and alert upon arrival

## 2015-11-15 ENCOUNTER — Other Ambulatory Visit: Payer: Self-pay | Admitting: *Deleted

## 2015-11-15 ENCOUNTER — Ambulatory Visit: Payer: Self-pay | Admitting: Pharmacist

## 2015-11-15 ENCOUNTER — Telehealth: Payer: Self-pay | Admitting: Family Medicine

## 2015-11-15 LAB — URINE CULTURE

## 2015-11-15 NOTE — Telephone Encounter (Signed)
Pt has lost the rx for her antibiotic and would like to know if she can have it sent to [facility].

## 2015-11-15 NOTE — Telephone Encounter (Signed)
THN notified.  

## 2015-11-15 NOTE — Patient Outreach (Addendum)
[facility]) Care Management   3/81/0175  Alyssa Ortega [birth year] [id number]  Alyssa Ortega is an 82 y.o. female  Subjective:  I am just worn out. Patient discussed her recent visit to emergency room, states she had a urinary tract infection . Patient states the prescription is lost.   Patient discussed having some shortness of breath when walking to kitchen and back, and has to relax a minute before breathing  easier.  Patient  reports she continues to use her cane, and denies having a fall.  Alyssa Ortega discussed her weight is 122.6, patient reports she drank 2 ensure on yesterday, and has plenty of ensure and plenty of food to eat, and when she gets low on ensure her friend or Ortega will help her to get more.   Alyssa Ortega discussed with being aggravated with being at home by herself all the time,but states I am not leaving my home, my mom gave it to me.   Objective:  BP 138/80 (BP Location: Right Arm)   Pulse 88   Wt 122 lb 9.6 oz (55.6 kg)   LMP  (LMP Unknown)   SpO2 96%   BMI 22.42 kg/m  Review of Systems  Constitutional: Negative.   HENT: Negative.   Eyes: Negative.   Respiratory:       Patient states occasional dry cough  Cardiovascular: Negative.   Gastrointestinal: Negative.   Genitourinary: Negative.  Negative for dysuria, frequency and urgency.  Musculoskeletal: Positive for back pain.  Skin: Negative.   Neurological: Negative.   Endo/Heme/Allergies: Bruises/bleeds easily.  Psychiatric/Behavioral: Negative.     Physical Exam  Constitutional: She is oriented to person, place, and time. She appears well-developed and well-nourished.  Cardiovascular: Normal rate, normal heart sounds and intact distal pulses.   Respiratory: Effort normal. She has no wheezes.  GI: Soft.  Neurological: She is alert and oriented to person, place, and time.  Skin: Skin is warm and dry.     Dry skin    Psychiatric: She has a normal mood and affect. Her  behavior is normal. Judgment and thought content normal.    Encounter Medications:  BP 138/80 (BP Location: Right Arm)   Pulse 88   Wt 122 lb 9.6 oz (55.6 kg)   LMP  (LMP Unknown)   SpO2 96%   BMI 22.42 kg/m   Outpatient Encounter Prescriptions as of 11/15/2015  Medication Sig  . acetaminophen (TYLENOL) 500 MG tablet Take 1 tablet (500 mg total) by mouth every 6 (six) hours as needed for mild pain, moderate pain, fever or headache.  . albuterol (PROVENTIL HFA;VENTOLIN HFA) 108 (90 Base) MCG/ACT inhaler Inhale 2 puffs into the lungs every 6 (six) hours as needed for wheezing or shortness of breath.  . cephALEXin (KEFLEX) 500 MG capsule Take 1 capsule (500 mg total) by mouth 2 (two) times daily.  . feeding supplement, ENSURE ENLIVE, (ENSURE ENLIVE) LIQD Take 237 mLs by mouth 2 (two) times daily between meals. (Patient not taking: Reported on 10/12/2015)   No facility-administered encounter medications on file as of 11/15/2015.     Functional Status:   In your present state of health, do you have any difficulty performing the following activities: 09/17/2015 08/17/2015  Hearing? Y N  Vision? N N  Difficulty concentrating or making decisions? N Y  Walking or climbing stairs? Y Y  Dressing or bathing? N N  Doing errands, shopping? [name]  Preparing Food and eating ? N -  Using the Toilet? N -  In the past six months, have you accidently leaked urine? N -  Do you have problems with loss of bowel control? N -  Managing your Medications? N -  Managing your Finances? N -  Housekeeping or managing your Housekeeping? Y -  Some recent data might be hidden    Fall/Depression Screening:    PHQ 2/9 Scores 09/17/2015 07/20/2015 06/29/2015 06/09/2015 03/30/2015 02/05/2015 01/28/2015  PHQ - 2 Score 0 0 0 0 0 0 0    Assessment:  Routine home visit   Urinary tract infection- patient has not had prescription filled due reported paper prescription lost,unable to locate during home visit. Reviewed signs and  symptoms of infection, patient denies having.Reinforced drinking adequate fluids.   Fall Risk - No recent fall,   Nutrition - patient has ensure available admits to drinking at least one a day,reinforced importance of nutrition and healthy meals, and supplement with ensue. Patient states she uses just a little bit of salt on her food.    COPD  Hx. - Patient does not have albuterol inhaler due to cost, but states she is agreeable to having her Ortega to assist with getting prescription filled.  Discussed with Alyssa Ortega if she is interested in being involved with senior center activities during the day, she stated she what like to try it and discussed she would need someone to walk with her out to the transportation, as someone always does that when she leaves the house.   Plan:  Placed call to PCP to arrange office visit as recommended after her recent emergency visit, also notified that patient unable to locate prescription for keflex, I have placed call [facility], they do not have an electronic prescription. I placed call to Alyssa Ortega regarding being able to pick up patient's prescription, no answer, will attempt later today or in am as Alyssa Ortega reports her Ortega will plan to visit her home in am.  Provided and reviewed EMMI handouts on COPD when to call, and Hypertension .  Place LCSW consult for community resources for senior activity for socialization.     1600 Received telephone call from [place] at [place], PCP office, stating PCP requesting patient be seen in office prior to providing prescription, and she will evaluate at that time if prescription needed. I have placed a call to Alyssa Ortega to explain this to her and stress the importance of attending office visit. Patient states her Ortega should visit her home in the am and she will let her know about the change in the plan regarding the prescription.  RN will attempt to contact Ortega at patient home  in am, I have been unsuccessful at contacting her.   [facility] CM Care Plan Problem Two   Flowsheet Row Most Recent Value  Care Plan Problem Two  Patient High fall risk  for Readmission related to fall   Role Documenting the Problem Two  Care Management Coordinator  Care Plan for Problem Two  Active  Interventions for Problem Two Long Term Goal   Reinforced  importance of following up with MD as recommended.   THN Long Term Goal (31-90) days  Patient will report no readmission related to fall in the next 31 days   THN Long Term Goal Start Date  10/13/15  [facility]N CM Short Term Goal #1 (0-30 days)  Patient will report no falls in the next 30 days  THN CM Short Term Goal #1 Start Date  10/13/15  [facility] CM Short Term Goal #1 Met  Date   11/15/15  THN CM Short Term Goal #2 (0-30 days)  Patient will continue to drink ensure daily to supplement diet in the next 30 days   THN CM Short Term Goal #2 Start Date  10/13/15  Interventions for Short Term Goal #2  Reinforced importance of nutrition and supplementing diet with ensure   THN CM Short Term Goal #3 (0-30 days)  Patient will attend PCP visit in the next 7 days   THN CM Short Term Goal #3 Start Date  11/16/15  Interventions for Short Term Goal #3  Placed call to PCP office to schedule office visit. and reinforced with patient to attend      Joylene Draft, RN, [place] Management [phone]- Mobile [phone]- [place]

## 2015-11-16 ENCOUNTER — Other Ambulatory Visit: Payer: Self-pay | Admitting: *Deleted

## 2015-11-16 NOTE — Patient Outreach (Addendum)
[place] [facility]) Care Management  [id number]  FLOYD PARILLA [id number]   Received incoming call from [name], friend of Mrs.Haslem,stating patient was at her home now she picked her up last night because Mrs.Rex doesn't like to stay by herself.Mrs.Martin states patient requested that she call me because she was unable to remember when I was contacting her again  Unable to speak with patient, [name] went on to discuss that she takes patient for her appointments and she states patient did not want her to take prescription to pharmacy.  Requesting  to speak with Mrs.Constancio, to get verbal consent to speak with [name] on her behalf, phone immediately hung up.  Unsuccessful attempt to contact Mrs.Bohnet's daughter by telephone able to leave a HIPAA compliant message with my return call number.    Plan Will plan outreach call to follow up  as planned, and then schedule next home visit.    Joylene Draft, RN, [place] Management [phone]- Mobile [phone]- Toll Free Main Office

## 2015-11-17 ENCOUNTER — Other Ambulatory Visit: Payer: Self-pay | Admitting: *Deleted

## 2015-11-17 DIAGNOSIS — I10 Essential (primary) hypertension: Secondary | ICD-10-CM

## 2015-11-17 DIAGNOSIS — J441 Chronic obstructive pulmonary disease with (acute) exacerbation: Secondary | ICD-10-CM

## 2015-11-17 LAB — CULTURE, BLOOD (ROUTINE X 2)
CULTURE: NO GROWTH
Culture: NO GROWTH

## 2015-11-17 NOTE — Patient Outreach (Signed)
[place] [facility]) Care Management  [id number]  Alyssa Ortega [id number]   Follow up call placed to Alyssa Ortega , HIPAA verified. Reminded Alyssa Ortega of her office visit with PCP on 9/21 at 36 patient states her friend will be able to provide transportation,reinforced importance of attending visit for PCP to evaluate for needs.   Plan Will schedule home visit in month of October   Joylene Draft, [place], [place] Management [phone]- Mobile [phone]- [place]

## 2015-11-18 ENCOUNTER — Encounter: Payer: Self-pay | Admitting: Family Medicine

## 2015-11-18 ENCOUNTER — Ambulatory Visit (INDEPENDENT_AMBULATORY_CARE_PROVIDER_SITE_OTHER): Payer: Commercial Managed Care - HMO | Admitting: Family Medicine

## 2015-11-18 VITALS — BP 136/84 | HR 92 | Temp 98.2°F | Wt 116.0 lb

## 2015-11-18 DIAGNOSIS — R627 Adult failure to thrive: Secondary | ICD-10-CM | POA: Diagnosis not present

## 2015-11-18 DIAGNOSIS — Z23 Encounter for immunization: Secondary | ICD-10-CM

## 2015-11-18 DIAGNOSIS — N3001 Acute cystitis with hematuria: Secondary | ICD-10-CM

## 2015-11-18 MED ORDER — FEXOFENADINE HCL 180 MG PO TABS: 180.0000 mg | ORAL_TABLET | Freq: Every day | ORAL | 1 refills | Status: AC

## 2015-11-18 MED ORDER — NITROFURANTOIN MONOHYD MACRO 100 MG PO CAPS: 100.0000 mg | ORAL_CAPSULE | Freq: Two times a day (BID) | ORAL | 0 refills | Status: DC

## 2015-11-18 NOTE — Patient Instructions (Signed)

## 2015-11-18 NOTE — Progress Notes (Signed)
BP 136/84 (BP Location: Right Arm, Patient Position: Sitting, Cuff Size: Small)   Pulse 92   Temp 98.2 F (36.8 C)   Wt 116 lb (52.6 kg)   LMP  (LMP Unknown)   SpO2 97%   BMI 21.22 kg/m    Subjective:    Patient ID: Alyssa Ortega, female    DOB: 1930/05/24, 82 y.o.   MRN: [id number]  HPI: Alyssa Ortega is a 82 y.o. female  Chief Complaint  Patient presents with  . Hospitalization Follow-up   ER FOLLOW UP- Resistant to the medications she got at the ER. Will treat with nitrofurantoin  Time since discharge: 6 days Hospital/facility: [facility] Diagnosis: UTI Procedures/tests: UA, fluids, EKG, CXR Consultants: None New medications: Keflex Discharge instructions:  Follow up here Status: stable  WEIGHT LOSS Duration: days Amount of weight loss: 6 lbs Fevers: no Decreased appetite: yes Night sweats: no Dysphagia/odynophagia: no Chest pain: no Shortness of breath: no Cough: no Nausea: no Vomiting: no Abdominal pain: no Blood in stool: no Easy bruising/bleeding: no Jaundice: no Polydipsia/polyuria: no Depression: no Previous colonoscopy: no  Relevant past medical, surgical, family and social history reviewed and updated as indicated. Interim medical history since our last visit reviewed. Allergies and medications reviewed and updated.  Review of Systems  Constitutional: Negative.   Respiratory: Negative.   Cardiovascular: Negative.   Genitourinary: Negative.   Psychiatric/Behavioral: Negative.     Per HPI unless specifically indicated above     Objective:    BP 136/84 (BP Location: Right Arm, Patient Position: Sitting, Cuff Size: Small)   Pulse 92   Temp 98.2 F (36.8 C)   Wt 116 lb (52.6 kg)   LMP  (LMP Unknown)   SpO2 97%   BMI 21.22 kg/m   Wt Readings from Last 3 Encounters:  11/18/15 116 lb (52.6 kg)  11/15/15 122 lb 9.6 oz (55.6 kg)  11/12/15 122 lb (55.3 kg)    Physical Exam  Constitutional: She is oriented to person, place, and time. She  appears well-developed. No distress.  HENT:  Head: Normocephalic and atraumatic.  Right Ear: Hearing normal.  Left Ear: Hearing normal.  Nose: Nose normal.  Eyes: Conjunctivae and lids are normal. Right eye exhibits no discharge. Left eye exhibits no discharge. No scleral icterus.  Cardiovascular: Normal rate, regular rhythm, normal heart sounds and intact distal pulses.  Exam reveals no gallop and no friction rub.   No murmur heard. Pulmonary/Chest: Effort normal and breath sounds normal. No respiratory distress. She has no wheezes. She has no rales. She exhibits no tenderness.  Abdominal: Soft. Bowel sounds are normal. She exhibits no distension and no mass. There is no tenderness. There is no rebound and no guarding.  Musculoskeletal: Normal range of motion.  Neurological: She is alert and oriented to person, place, and time.  Skin: Skin is warm, dry and intact. No rash noted. No erythema. No pallor.  Psychiatric: She has a normal mood and affect. Her speech is normal and behavior is normal. Judgment and thought content normal. Cognition and memory are normal.  Nursing note and vitals reviewed.   Results for orders placed or performed during the hospital encounter of 11/12/15  Urine culture  Result Value Ref Range   Specimen Description URINE, RANDOM    Special Requests NONE    Culture (A)     20,000 COLONIES/mL METHICILLIN RESISTANT STAPHYLOCOCCUS AUREUS   Report Status 11/15/2015 FINAL    Organism ID, Bacteria METHICILLIN RESISTANT STAPHYLOCOCCUS AUREUS (A)  Susceptibility   Methicillin resistant staphylococcus aureus - MIC*    CIPROFLOXACIN >=8 RESISTANT Resistant     GENTAMICIN <=0.5 SENSITIVE Sensitive     NITROFURANTOIN <=16 SENSITIVE Sensitive     OXACILLIN >=4 RESISTANT Resistant     TETRACYCLINE <=1 SENSITIVE Sensitive     VANCOMYCIN <=0.5 SENSITIVE Sensitive     TRIMETH/SULFA <=10 SENSITIVE Sensitive     CLINDAMYCIN <=0.25 SENSITIVE Sensitive     RIFAMPIN <=0.5  SENSITIVE Sensitive     Inducible Clindamycin NEGATIVE Sensitive     * 20,000 COLONIES/mL METHICILLIN RESISTANT STAPHYLOCOCCUS AUREUS  Culture, blood (routine x 2)  Result Value Ref Range   Specimen Description BLOOD RIGHT ARM    Special Requests BOTTLES DRAWN AEROBIC AND ANAEROBIC 10CC    Culture NO GROWTH 5 DAYS    Report Status 11/17/2015 FINAL   Culture, blood (routine x 2)  Result Value Ref Range   Specimen Description BLOOD LEFT ARM    Special Requests BOTTLES DRAWN AEROBIC AND ANAEROBIC 10CC    Culture NO GROWTH 5 DAYS    Report Status 11/17/2015 FINAL   Basic metabolic panel  Result Value Ref Range   Sodium 130 (L) 135 - 145 mmol/L   Potassium 3.5 3.5 - 5.1 mmol/L   Chloride 92 (L) 101 - 111 mmol/L   CO2 28 22 - 32 mmol/L   Glucose, Bld 114 (H) 65 - 99 mg/dL   BUN 9 6 - 20 mg/dL   Creatinine, Ser 0.53 0.44 - 1.00 mg/dL   Calcium 9.1 8.9 - 10.3 mg/dL   GFR calc non Af Amer >60 >60 mL/min   GFR calc Af Amer >60 >60 mL/min   Anion gap 10 5 - 15  Troponin I  Result Value Ref Range   Troponin I <0.03 <0.03 ng/mL  CBC with Differential  Result Value Ref Range   WBC 7.4 3.6 - 11.0 K/uL   RBC 4.17 3.80 - 5.20 MIL/uL   Hemoglobin 13.6 12.0 - 16.0 g/dL   HCT 39.2 35.0 - 47.0 %   MCV 93.8 80.0 - 100.0 fL   MCH 32.7 26.0 - 34.0 pg   MCHC 34.8 32.0 - 36.0 g/dL   RDW 15.2 (H) 11.5 - 14.5 %   Platelets 120 (L) 150 - 440 K/uL   Neutrophils Relative % 74 %   Neutro Abs 5.4 1.4 - 6.5 K/uL   Lymphocytes Relative 18 %   Lymphs Abs 1.3 1.0 - 3.6 K/uL   Monocytes Relative 8 %   Monocytes Absolute 0.6 0.2 - 0.9 K/uL   Eosinophils Relative 0 %   Eosinophils Absolute 0.0 0 - 0.7 K/uL   Basophils Relative 0 %   Basophils Absolute 0.0 0 - 0.1 K/uL  Urinalysis complete, with microscopic  Result Value Ref Range   Color, Urine YELLOW (A) YELLOW   APPearance HAZY (A) CLEAR   Glucose, UA NEGATIVE NEGATIVE mg/dL   Bilirubin Urine NEGATIVE NEGATIVE   Ketones, ur NEGATIVE NEGATIVE  mg/dL   Specific Gravity, Urine 1.009 1.005 - 1.030   Hgb urine dipstick NEGATIVE NEGATIVE   pH 6.0 5.0 - 8.0   Protein, ur NEGATIVE NEGATIVE mg/dL   Nitrite NEGATIVE NEGATIVE   Leukocytes, UA 3+ (A) NEGATIVE   RBC / HPF 0-5 0 - 5 RBC/hpf   WBC, UA 6-30 0 - 5 WBC/hpf   Bacteria, UA RARE (A) NONE SEEN   Squamous Epithelial / LPF 0-5 (A) NONE SEEN   Mucous PRESENT  Assessment & Plan:   Problem List Items Addressed This Visit      Other   Adult failure to thrive - Primary    Feeling well, but with significant concern. Has lost 6 lbs in 6 days. Has not been eating. Has not been cooking. States that she does not want meals on wheels, but friend who accompanies her and cares for her is concerned. Does not want to go into any type of facility. Unclear how she is doing at home. Numerous ER visits- often for falling after drinking beer. Now only drinking her ensure and not using it to supplement. Discussed a palliative consult to discuss what she wants and how we can help her. Social worker may be a big benefit. Has had [facility] in previously, but she does not like them. Agreed to palliative consult, but needs to have it scheduled when her daughter is there. Daughter is POA, but works 3rd shift. Referral generated today. Recheck on her in 2 weeks to make sure she is not losing more weight.        Other Visit Diagnoses    Acute cystitis with hematuria       Resistant to rocephin and keflex. Will treat with nitrofurantoin. Rx given today. Stop keflex.    Relevant Orders   UA/M w/rflx Culture, Routine   Immunization due       Flu shot given today.   Relevant Orders   Flu vaccine HIGH DOSE PF (Fluzone High dose) (Completed)       Follow up plan: Return in about 2 weeks (around 12/02/2015) for weight check.

## 2015-11-18 NOTE — Assessment & Plan Note (Signed)
Feeling well, but with significant concern. Has lost 6 lbs in 6 days. Has not been eating. Has not been cooking. States that she does not want meals on wheels, but friend who accompanies her and cares for her is concerned. Does not want to go into any type of facility. Unclear how she is doing at home. Numerous ER visits- often for falling after drinking beer. Now only drinking her ensure and not using it to supplement. Discussed a palliative consult to discuss what she wants and how we can help her. Social worker may be a big benefit. Has had [facility] in previously, but she does not like them. Agreed to palliative consult, but needs to have it scheduled when her daughter is there. Daughter is POA, but works 3rd shift. Referral generated today. Recheck on her in 2 weeks to make sure she is not losing more weight.

## 2015-11-19 ENCOUNTER — Telehealth: Payer: Self-pay | Admitting: Family Medicine

## 2015-11-19 ENCOUNTER — Other Ambulatory Visit: Payer: Self-pay | Admitting: *Deleted

## 2015-11-19 NOTE — Telephone Encounter (Signed)
Pt's caregiver called stated she wants to wait until Monday to do her pee test. No further information provided. Please call pt's caregiver [name] back ASAP. Thanks.

## 2015-11-19 NOTE — Patient Outreach (Signed)
Alyssa Ortega) Care Management  [id number]  TIFFANYANN CANIPE [id number]   Phone call to patient at the request of RNCM to discuss community resources to increase patient's socialization.  There was no answer when attempt made to contact patient.  This social worker will attempt to contact patient within 2 business days to discuss  the congregate meal program as well as [facility].    [place], [place] Management [phone]

## 2015-11-19 NOTE — Addendum Note (Signed)
Addended by: Valerie Roys on: 11/19/2015 04:37 PM   Modules accepted: Orders

## 2015-11-19 NOTE — Telephone Encounter (Signed)
Patient will bring urine in on Monday.

## 2015-11-22 ENCOUNTER — Other Ambulatory Visit: Payer: Self-pay | Admitting: *Deleted

## 2015-11-22 NOTE — Patient Outreach (Signed)
[place] [facility]) Care Management  [id number]  SHALEAH DEGIORGIO [id number]    Second phone call to patient at the request of RNCM to discuss community resources to increase patient's socialization.  There was no answer when attempt made to contact patient.  This social worker will attempt to contact patient within 2 business days to discuss  the congregate meal program as well as [facility].    [place], [place] Management [phone]

## 2015-11-23 ENCOUNTER — Other Ambulatory Visit: Payer: Self-pay | Admitting: *Deleted

## 2015-11-23 NOTE — Patient Outreach (Addendum)
[facility]) Care Management  [id number]  SYRIA AMEDEO [id number]    Third phone call to patient at the request of RNCM to discuss community resources to increase patient's socialization. There was no answer when attempt made to contact patient. This social worker will attempt to contact patient within 3 business days to discuss the congregate meal program as well as [facility].    [place], [place] Management [phone]

## 2015-11-30 ENCOUNTER — Other Ambulatory Visit: Payer: Self-pay | Admitting: *Deleted

## 2015-11-30 ENCOUNTER — Encounter: Payer: Self-pay | Admitting: *Deleted

## 2015-11-30 NOTE — Patient Outreach (Signed)
[facility]) Care Management  [phone]  REDONDA WOLTER [id number]   Fourth attempt to contact patien to discuss the congregate meal program as well as [facility]. There was no answer.  This Education officer, museum will send patient a letter requesting a return call.   Sheralyn Boatman [facility] Care Management [phone]

## 2015-12-02 ENCOUNTER — Ambulatory Visit: Payer: Commercial Managed Care - HMO | Admitting: Family Medicine

## 2015-12-04 ENCOUNTER — Inpatient Hospital Stay
Admission: EM | Admit: 2015-12-04 | Discharge: 2015-12-29 | DRG: 640 | Disposition: E | Payer: Commercial Managed Care - HMO | Attending: Internal Medicine | Admitting: Internal Medicine

## 2015-12-04 ENCOUNTER — Emergency Department: Payer: Commercial Managed Care - HMO

## 2015-12-04 DIAGNOSIS — R112 Nausea with vomiting, unspecified: Secondary | ICD-10-CM

## 2015-12-04 DIAGNOSIS — Z515 Encounter for palliative care: Secondary | ICD-10-CM

## 2015-12-04 DIAGNOSIS — E440 Moderate protein-calorie malnutrition: Secondary | ICD-10-CM | POA: Insufficient documentation

## 2015-12-04 DIAGNOSIS — E871 Hypo-osmolality and hyponatremia: Principal | ICD-10-CM | POA: Diagnosis present

## 2015-12-04 DIAGNOSIS — R140 Abdominal distension (gaseous): Secondary | ICD-10-CM

## 2015-12-04 DIAGNOSIS — Z886 Allergy status to analgesic agent status: Secondary | ICD-10-CM

## 2015-12-04 DIAGNOSIS — R627 Adult failure to thrive: Secondary | ICD-10-CM | POA: Diagnosis present

## 2015-12-04 DIAGNOSIS — Z96641 Presence of right artificial hip joint: Secondary | ICD-10-CM | POA: Diagnosis present

## 2015-12-04 DIAGNOSIS — Z6821 Body mass index (BMI) 21.0-21.9, adult: Secondary | ICD-10-CM

## 2015-12-04 DIAGNOSIS — F1010 Alcohol abuse, uncomplicated: Secondary | ICD-10-CM | POA: Diagnosis present

## 2015-12-04 DIAGNOSIS — Z79899 Other long term (current) drug therapy: Secondary | ICD-10-CM

## 2015-12-04 DIAGNOSIS — Z87891 Personal history of nicotine dependence: Secondary | ICD-10-CM

## 2015-12-04 DIAGNOSIS — R079 Chest pain, unspecified: Secondary | ICD-10-CM | POA: Diagnosis not present

## 2015-12-04 DIAGNOSIS — R0603 Acute respiratory distress: Secondary | ICD-10-CM | POA: Diagnosis present

## 2015-12-04 DIAGNOSIS — R0602 Shortness of breath: Secondary | ICD-10-CM

## 2015-12-04 DIAGNOSIS — R531 Weakness: Secondary | ICD-10-CM

## 2015-12-04 DIAGNOSIS — E43 Unspecified severe protein-calorie malnutrition: Secondary | ICD-10-CM | POA: Diagnosis present

## 2015-12-04 DIAGNOSIS — K2970 Gastritis, unspecified, without bleeding: Secondary | ICD-10-CM | POA: Diagnosis present

## 2015-12-04 DIAGNOSIS — R0600 Dyspnea, unspecified: Secondary | ICD-10-CM

## 2015-12-04 DIAGNOSIS — R109 Unspecified abdominal pain: Secondary | ICD-10-CM

## 2015-12-04 DIAGNOSIS — Z66 Do not resuscitate: Secondary | ICD-10-CM | POA: Diagnosis present

## 2015-12-04 DIAGNOSIS — R52 Pain, unspecified: Secondary | ICD-10-CM

## 2015-12-04 DIAGNOSIS — I248 Other forms of acute ischemic heart disease: Secondary | ICD-10-CM | POA: Diagnosis present

## 2015-12-04 DIAGNOSIS — M810 Age-related osteoporosis without current pathological fracture: Secondary | ICD-10-CM | POA: Diagnosis present

## 2015-12-04 DIAGNOSIS — J449 Chronic obstructive pulmonary disease, unspecified: Secondary | ICD-10-CM | POA: Diagnosis present

## 2015-12-04 LAB — CBC
HCT: 38.8 % (ref 35.0–47.0)
HEMOGLOBIN: 13.6 g/dL (ref 12.0–16.0)
MCH: 32.2 pg (ref 26.0–34.0)
MCHC: 35 g/dL (ref 32.0–36.0)
MCV: 91.9 fL (ref 80.0–100.0)
PLATELETS: 137 10*3/uL — AB (ref 150–440)
RBC: 4.23 MIL/uL (ref 3.80–5.20)
RDW: 14.4 % (ref 11.5–14.5)
WBC: 9.6 10*3/uL (ref 3.6–11.0)

## 2015-12-04 LAB — TROPONIN I: TROPONIN I: 0.03 ng/mL — AB (ref <0.03)

## 2015-12-04 LAB — BASIC METABOLIC PANEL
ANION GAP: 10 (ref 5–15)
BUN: 9 mg/dL (ref 6–20)
CALCIUM: 8.9 mg/dL (ref 8.9–10.3)
CO2: 25 mmol/L (ref 22–32)
CREATININE: 0.57 mg/dL (ref 0.44–1.00)
Chloride: 91 mmol/L — ABNORMAL LOW (ref 101–111)
GFR calc Af Amer: >60 mL/min (ref >60)
GLUCOSE: 87 mg/dL (ref 65–99)
Potassium: 3.7 mmol/L (ref 3.5–5.1)
Sodium: 126 mmol/L — ABNORMAL LOW (ref 135–145)

## 2015-12-04 NOTE — ED Notes (Signed)
Patient transported to X-ray 

## 2015-12-04 NOTE — ED Triage Notes (Signed)
Pt from home via EMS, reports generalized chest pain X1week, also reports nausea.

## 2015-12-05 LAB — MRSA PCR SCREENING: MRSA by PCR: NEGATIVE

## 2015-12-05 LAB — CKMB (ARMC ONLY): CK, MB: 3 ng/mL (ref 0.5–5.0)

## 2015-12-05 LAB — TSH: TSH: 3.56 u[IU]/mL (ref 0.350–4.500)

## 2015-12-05 LAB — ETHANOL: Alcohol, Ethyl (B): <5 mg/dL (ref <5)

## 2015-12-05 MED ORDER — LORAZEPAM 1 MG PO TABS: 1.0000 mg | ORAL_TABLET | Freq: Four times a day (QID) | ORAL | Status: DC | PRN

## 2015-12-05 MED ORDER — SODIUM CHLORIDE 0.9 % IV SOLN
INTRAVENOUS | Status: DC
  Administered 2015-12-05 – 2015-12-06 (×4): via INTRAVENOUS

## 2015-12-05 MED ORDER — DOCUSATE SODIUM 100 MG PO CAPS
100.0000 mg | ORAL_CAPSULE | Freq: Two times a day (BID) | ORAL | Status: DC
  Administered 2015-12-05 – 2015-12-06 (×3): 100 mg via ORAL
  Filled 2015-12-05 (×3): qty 1

## 2015-12-05 MED ORDER — LORAZEPAM 2 MG/ML IJ SOLN
0.0000 mg | Freq: Four times a day (QID) | INTRAMUSCULAR | Status: DC
  Administered 2015-12-05: 1 mg via INTRAVENOUS
  Filled 2015-12-05: qty 1

## 2015-12-05 MED ORDER — IPRATROPIUM-ALBUTEROL 0.5-2.5 (3) MG/3ML IN SOLN: 3.0000 mL | RESPIRATORY_TRACT | Status: DC | PRN

## 2015-12-05 MED ORDER — ADULT MULTIVITAMIN W/MINERALS CH
1.0000 | ORAL_TABLET | Freq: Every day | ORAL | Status: DC
  Administered 2015-12-05 – 2015-12-06 (×2): 1 via ORAL
  Filled 2015-12-05 (×2): qty 1

## 2015-12-05 MED ORDER — ACETAMINOPHEN 650 MG RE SUPP: 650.0000 mg | Freq: Four times a day (QID) | RECTAL | Status: DC | PRN

## 2015-12-05 MED ORDER — NITROFURANTOIN MONOHYD MACRO 100 MG PO CAPS
100.0000 mg | ORAL_CAPSULE | Freq: Two times a day (BID) | ORAL | Status: DC
  Filled 2015-12-05 (×2): qty 1

## 2015-12-05 MED ORDER — FOLIC ACID 1 MG PO TABS
1.0000 mg | ORAL_TABLET | Freq: Every day | ORAL | Status: DC
  Administered 2015-12-05 – 2015-12-06 (×2): 1 mg via ORAL
  Filled 2015-12-05 (×2): qty 1

## 2015-12-05 MED ORDER — FAMOTIDINE IN NACL 20-0.9 MG/50ML-% IV SOLN
20.0000 mg | Freq: Two times a day (BID) | INTRAVENOUS | Status: DC
  Administered 2015-12-05 – 2015-12-06 (×4): 20 mg via INTRAVENOUS
  Filled 2015-12-05 (×6): qty 50

## 2015-12-05 MED ORDER — ONDANSETRON HCL 4 MG PO TABS: 4.0000 mg | ORAL_TABLET | Freq: Four times a day (QID) | ORAL | Status: DC | PRN

## 2015-12-05 MED ORDER — ACETAMINOPHEN 325 MG PO TABS: 650.0000 mg | ORAL_TABLET | Freq: Four times a day (QID) | ORAL | Status: DC | PRN

## 2015-12-05 MED ORDER — NITROGLYCERIN 2 % TD OINT
0.5000 [in_us] | TOPICAL_OINTMENT | Freq: Once | TRANSDERMAL | Status: AC
  Administered 2015-12-05: 0.5 [in_us] via TOPICAL
  Filled 2015-12-05: qty 1

## 2015-12-05 MED ORDER — LORATADINE 10 MG PO TABS
10.0000 mg | ORAL_TABLET | Freq: Every day | ORAL | Status: DC
  Administered 2015-12-05 – 2015-12-06 (×2): 10 mg via ORAL
  Filled 2015-12-05 (×2): qty 1

## 2015-12-05 MED ORDER — LORAZEPAM 2 MG/ML IJ SOLN
1.0000 mg | Freq: Four times a day (QID) | INTRAMUSCULAR | Status: DC | PRN
  Filled 2015-12-05: qty 1

## 2015-12-05 MED ORDER — ONDANSETRON HCL 4 MG/2ML IJ SOLN
4.0000 mg | Freq: Once | INTRAMUSCULAR | Status: AC
  Administered 2015-12-05: 4 mg via INTRAVENOUS
  Filled 2015-12-05: qty 2

## 2015-12-05 MED ORDER — VITAMIN B-1 100 MG PO TABS
100.0000 mg | ORAL_TABLET | Freq: Every day | ORAL | Status: DC
  Administered 2015-12-05 – 2015-12-06 (×2): 100 mg via ORAL
  Filled 2015-12-05 (×2): qty 1

## 2015-12-05 MED ORDER — VITAMIN B-1 100 MG PO TABS: 100.0000 mg | ORAL_TABLET | Freq: Every day | ORAL | Status: DC

## 2015-12-05 MED ORDER — THIAMINE HCL 100 MG/ML IJ SOLN: 100.0000 mg | Freq: Every day | INTRAMUSCULAR | Status: DC

## 2015-12-05 MED ORDER — LORAZEPAM 2 MG/ML IJ SOLN
0.0000 mg | Freq: Two times a day (BID) | INTRAMUSCULAR | Status: DC
Start: 2015-12-07 — End: 2015-12-07
  Administered 2015-12-07: 03:00:00 2 mg via INTRAVENOUS

## 2015-12-05 MED ORDER — ONDANSETRON HCL 4 MG/2ML IJ SOLN
4.0000 mg | Freq: Four times a day (QID) | INTRAMUSCULAR | Status: DC | PRN
  Administered 2015-12-05 – 2015-12-08 (×4): 4 mg via INTRAVENOUS
  Filled 2015-12-05 (×5): qty 2

## 2015-12-05 MED ORDER — ENSURE ENLIVE PO LIQD
237.0000 mL | Freq: Two times a day (BID) | ORAL | Status: DC
  Administered 2015-12-05 – 2015-12-06 (×2): 237 mL via ORAL

## 2015-12-05 MED ORDER — PANTOPRAZOLE SODIUM 40 MG PO TBEC
40.0000 mg | DELAYED_RELEASE_TABLET | Freq: Two times a day (BID) | ORAL | Status: DC
  Administered 2015-12-05 – 2015-12-06 (×2): 40 mg via ORAL
  Filled 2015-12-05 (×2): qty 1

## 2015-12-05 MED ORDER — PROMETHAZINE HCL 25 MG/ML IJ SOLN
12.5000 mg | Freq: Four times a day (QID) | INTRAMUSCULAR | Status: DC | PRN
  Administered 2015-12-05 – 2015-12-06 (×4): 12.5 mg via INTRAVENOUS
  Filled 2015-12-05 (×4): qty 1

## 2015-12-05 MED ORDER — LORAZEPAM 2 MG/ML IJ SOLN
0.0000 mg | Freq: Four times a day (QID) | INTRAMUSCULAR | Status: DC
  Filled 2015-12-05: qty 1

## 2015-12-05 MED ORDER — ENSURE ENLIVE PO LIQD
237.0000 mL | Freq: Two times a day (BID) | ORAL | Status: DC
Start: 2015-12-05 — End: 2015-12-06

## 2015-12-05 MED ORDER — ORAL CARE MOUTH RINSE
15.0000 mL | Freq: Two times a day (BID) | OROMUCOSAL | Status: DC
  Administered 2015-12-05 – 2015-12-06 (×2): 15 mL via OROMUCOSAL

## 2015-12-05 MED ORDER — ENOXAPARIN SODIUM 40 MG/0.4ML ~~LOC~~ SOLN
40.0000 mg | SUBCUTANEOUS | Status: DC
  Administered 2015-12-05 – 2015-12-06 (×2): 40 mg via SUBCUTANEOUS
  Filled 2015-12-05 (×3): qty 0.4

## 2015-12-05 MED ORDER — SODIUM CHLORIDE 0.9 % IV BOLUS (SEPSIS)
1000.0000 mL | Freq: Once | INTRAVENOUS | Status: AC
  Administered 2015-12-05: 1000 mL via INTRAVENOUS

## 2015-12-05 NOTE — Evaluation (Signed)
Physical Therapy Evaluation Patient Details Name: Alyssa Ortega MRN: [id number] DOB: 23-Jul-1930 Today's Date: 12/05/2015   History of Present Illness  Pt admitted for hyponatremia. Pt with complaints of chest pain. Pt with history of alcohol abuse drinks 2 beers daily, HTN, and gastritis.   Clinical Impression  Pt is a pleasant 82 year old female who was admitted for hyponatremia. Pt performs bed mobility with min assist, transfers with mod assist, and ambulation with min assist and RW. Pt appears slightly confused to situation, although oriented to person/place. All mobility performed on RA with sats decreasing to 85%, returned 2L of O2 once seated in recliner. Pt demonstrates deficits with strength/endurance/mobility. Would benefit from skilled PT to address above deficits and promote optimal return to PLOF; recommend transition to STR upon discharge from acute hospitalization.       Follow Up Recommendations SNF    Equipment Recommendations       Recommendations for Other Services       Precautions / Restrictions Precautions Precautions: Fall Restrictions Weight Bearing Restrictions: No      Mobility  Bed Mobility Overal bed mobility: Needs Assistance Bed Mobility: Supine to Sit     Supine to sit: Min assist     General bed mobility comments: assist for trunk stability and scooting out towards EOB. Once seated, able to sit with supervison  Transfers Overall transfer level: Needs assistance   Transfers: Sit to/from Stand Sit to Stand: Mod assist         General transfer comment: assist for standing. Pt with slight post leaning noted. Kyphotic posture noted. All mobility performed on room air  Ambulation/Gait Ambulation/Gait assistance: Min assist Ambulation Distance (Feet): 3 Feet Assistive device: Rolling walker (2 wheeled) Gait Pattern/deviations: Step-to pattern     General Gait Details: ambulated to recliner with slow step to gait pattern. Pt on room  air with sats decreasing to 85% and slight SOB symptoms. 2L of O2 reapplied with sats improving to 91% within 1 minute.  Stairs            Wheelchair Mobility    Modified Rankin (Stroke Patients Only)       Balance Overall balance assessment: Needs assistance Sitting-balance support: Feet supported;Bilateral upper extremity supported Sitting balance-Leahy Scale: Good     Standing balance support: Bilateral upper extremity supported Standing balance-Leahy Scale: Fair Standing balance comment: post lean noted                             Pertinent Vitals/Pain Pain Assessment: Faces Faces Pain Scale: Hurts little more Pain Location: chest-ribs from coughing Pain Descriptors / Indicators: Discomfort;Dull Pain Intervention(s): Limited activity within patient's tolerance    Home Living Family/patient expects to be discharged to:: Private residence Living Arrangements: Alone Available Help at Discharge: Neighbor Type of Home: House Home Access: Stairs to enter Entrance Stairs-Rails: None Entrance Stairs-Number of Steps: 1 Home Layout: One level Home Equipment: Environmental consultant - 2 wheels;Cane - single point;Bedside commode;Shower seat      Prior Function Level of Independence: Needs assistance   Gait / Transfers Assistance Needed: Pt ambulates limited community distances with prn use of spc           Hand Dominance        Extremity/Trunk Assessment   Upper Extremity Assessment: Generalized weakness (B UE grossly 4/5)           Lower Extremity Assessment: Generalized weakness (grossly 3+/5)  Communication   Communication: No difficulties  Cognition Arousal/Alertness: Awake/alert Behavior During Therapy: WFL for tasks assessed/performed Overall Cognitive Status: Within Functional Limits for tasks assessed                      General Comments      Exercises Other Exercises Other Exercises: Seated ther-ex performed including B  LE ankle pumps, LAQ, and hip abd/add. All ther-ex performed x 10 reps on B LE with supervision   Assessment/Plan    PT Assessment Patient needs continued PT services  PT Problem List Decreased strength;Decreased mobility;Decreased balance;Decreased knowledge of use of DME;Decreased safety awareness;Cardiopulmonary status limiting activity;Pain          PT Treatment Interventions Gait training;Therapeutic exercise;Balance training    PT Goals (Current goals can be found in the Care Plan section)  Acute Rehab PT Goals Patient Stated Goal: to get stronger PT Goal Formulation: With patient Time For Goal Achievement: 12/19/15 Potential to Achieve Goals: Good    Frequency Min 2X/week   Barriers to discharge        Co-evaluation               End of Session Equipment Utilized During Treatment: Gait belt;Oxygen Activity Tolerance: Patient tolerated treatment well Patient left: in chair;with chair alarm set Nurse Communication: Mobility status    Functional Assessment Tool Used: clinical judgement Functional Limitation: Mobility: Walking and moving around Mobility: Walking and Moving Around Current Status [id number]): At least 20 percent but less than 40 percent impaired, limited or restricted Mobility: Walking and Moving Around Goal Status [phone]): At least 1 percent but less than 20 percent impaired, limited or restricted    Time: 1339-1405 PT Time Calculation (min) (ACUTE ONLY): 26 min   Charges:   PT Evaluation $PT Eval Moderate Complexity: 1 Procedure PT Treatments $Therapeutic Exercise: 8-22 mins   PT G Codes:   PT G-Codes **NOT FOR INPATIENT CLASS** Functional Assessment Tool Used: clinical judgement Functional Limitation: Mobility: Walking and moving around Mobility: Walking and Moving Around Current Status [id number]): At least 20 percent but less than 40 percent impaired, limited or restricted Mobility: Walking and Moving Around Goal Status [phone]): At least 1  percent but less than 20 percent impaired, limited or restricted    Yader Criger 12/05/2015, 4:20 PM  Greggory Stallion, PT, DPT [phone]

## 2015-12-05 NOTE — H&P (Signed)
Alyssa Ortega is an 82 y.o. female.   Chief Complaint: Chest pain HPI: The patient with past medical history of alcohol abuse presents to the emergency department via EMS after complaining of chest pain. It is unclear who called EMS but the patient does not complain of chest pain at the time of my interview. She is very somnolent and cannot contribute much to her history. She states that she hurts all over. Laboratory evaluation the emergency department revealed significant hyponatremia, and minimally elevated troponin which prompted the emergency department staff to call for admission.  Past Medical History:  Diagnosis Date  . Bursitis   . Dehydration   . Gastritis   . GI bleed 10/2007   secondary to duodenal ulcers  . Hip fracture ([place]) 3/09   left, s/p hemiarthroplasty  . History of epistaxis    had MRI by Dr. Carlis Abbott  . Hyperlipidemia   . Hypertension   . Insomnia   . Osteoporosis   . Rib fractures    traumatic    Past Surgical History:  Procedure Laterality Date  . ABDOMINAL HYSTERECTOMY    . BACK SURGERY    . HEMIARTHROPLASTY HIP  3/09, 10/11   left hip  . JOINT REPLACEMENT  2009   Left hip    Family History  Problem Relation Age of Onset  . Cancer Son 40    colon  . Arthritis Brother    Social History:  reports that she quit smoking about 24 years ago. Her smoking use included Cigarettes. She has never used smokeless tobacco. She reports that she drinks alcohol. She reports that she does not use drugs.  Allergies:  Allergies  Allergen Reactions  . Aspirin Hives and Other (See Comments)    Nosebleed per patient    Medications Prior to Admission  Medication Sig Dispense Refill  . acetaminophen (TYLENOL) 500 MG tablet Take 1 tablet (500 mg total) by mouth every 6 (six) hours as needed for mild pain, moderate pain, fever or headache. 30 tablet 0  . albuterol (PROVENTIL HFA;VENTOLIN HFA) 108 (90 Base) MCG/ACT inhaler Inhale 2 puffs into the lungs every 6 (six)  hours as needed for wheezing or shortness of breath. 3 Inhaler 1  . feeding supplement, ENSURE ENLIVE, (ENSURE ENLIVE) LIQD Take 237 mLs by mouth 2 (two) times daily between meals. 237 mL 12  . fexofenadine (ALLEGRA ALLERGY) 180 MG tablet Take 1 tablet (180 mg total) by mouth daily. 90 tablet 1  . nitrofurantoin, macrocrystal-monohydrate, (MACROBID) 100 MG capsule Take 1 capsule (100 mg total) by mouth 2 (two) times daily. 14 capsule 0    Results for orders placed or performed during the hospital encounter of 12/11/2015 (from the past 48 hour(s))  Basic metabolic panel     Status: Abnormal   Collection Time: 12/26/2015 10:18 PM  Result Value Ref Range   Sodium 126 (L) 135 - 145 mmol/L   Potassium 3.7 3.5 - 5.1 mmol/L   Chloride 91 (L) 101 - 111 mmol/L   CO2 25 22 - 32 mmol/L   Glucose, Bld 87 65 - 99 mg/dL   BUN 9 6 - 20 mg/dL   Creatinine, Ser 0.57 0.44 - 1.00 mg/dL   Calcium 8.9 8.9 - 10.3 mg/dL   GFR calc non Af Amer >60 >60 mL/min   GFR calc Af Amer >60 >60 mL/min    Comment: (NOTE) The eGFR has been calculated using the CKD EPI equation. This calculation has not been validated in all clinical situations.  eGFR's persistently <60 mL/min signify possible Chronic Kidney Disease.    Anion gap 10 5 - 15  CBC     Status: Abnormal   Collection Time: 12/19/2015 10:18 PM  Result Value Ref Range   WBC 9.6 3.6 - 11.0 K/uL   RBC 4.23 3.80 - 5.20 MIL/uL   Hemoglobin 13.6 12.0 - 16.0 g/dL   HCT 38.8 35.0 - 47.0 %   MCV 91.9 80.0 - 100.0 fL   MCH 32.2 26.0 - 34.0 pg   MCHC 35.0 32.0 - 36.0 g/dL   RDW 14.4 11.5 - 14.5 %   Platelets 137 (L) 150 - 440 K/uL  Troponin I     Status: Abnormal   Collection Time: 12/17/2015 10:18 PM  Result Value Ref Range   Troponin I 0.03 (HH) <0.03 ng/mL    Comment: CRITICAL RESULT CALLED TO, READ BACK BY AND VERIFIED WITH GRACE CINDRIC AT 2254 11/28/2015.PMH  TSH     Status: None   Collection Time: 12/08/2015 10:18 PM  Result Value Ref Range   TSH 3.560 0.350 -  4.500 uIU/mL    Comment: Performed by a 3rd Generation assay with a functional sensitivity of <=0.01 uIU/mL.  Ethanol     Status: None   Collection Time: 12/05/15 12:29 AM  Result Value Ref Range   Alcohol, Ethyl (B) <5 <5 mg/dL    Comment:        LOWEST DETECTABLE LIMIT FOR SERUM ALCOHOL IS 5 mg/dL FOR MEDICAL PURPOSES ONLY   MRSA PCR Screening     Status: None   Collection Time: 12/05/15  4:13 AM  Result Value Ref Range   MRSA by PCR NEGATIVE NEGATIVE    Comment:        The GeneXpert MRSA Assay (FDA approved for NASAL specimens only), is one component of a comprehensive MRSA colonization surveillance program. It is not intended to diagnose MRSA infection nor to guide or monitor treatment for MRSA infections.    Dg Chest 2 View  Result Date: 12/27/2015 CLINICAL DATA:  Acute onset of generalized chest pain and weakness. Initial encounter. EXAM: CHEST  2 VIEW COMPARISON:  Chest radiograph from 11/12/2015 FINDINGS: The lungs are well-aerated. Peribronchial thickening is noted. Left basilar scarring is seen. There is no evidence of pleural effusion or pneumothorax. The heart is borderline normal in size. No acute osseous abnormalities are seen. Multiple chronic compression deformities are noted along the thoracic and upper lumbar spine, with associated changes of vertebroplasty at multiple levels. IMPRESSION: Peribronchial thickening noted.  Left basilar scarring seen. Electronically Signed   By: Garald Balding M.D.   On: 12/03/2015 22:49    Review of Systems  Unable to perform ROS: Mental acuity    Blood pressure (!) 141/78, pulse (!) 102, temperature 97.8 F (36.6 C), temperature source Oral, resp. rate 20, height _0  (1.575 m), weight 52.6 kg (116 lb), SpO2 94 %. Physical Exam  Vitals reviewed. Constitutional: She is oriented to person, place, and time. She appears well-developed and well-nourished. No distress.  HENT:  Head: Normocephalic and atraumatic.  Mouth/Throat:  Oropharynx is clear and moist.  Eyes: Conjunctivae and EOM are normal. Pupils are equal, round, and reactive to light. No scleral icterus.  Neck: Normal range of motion. Neck supple. No JVD present. No tracheal deviation present. No thyromegaly present.  Cardiovascular: Normal rate, regular rhythm and normal heart sounds.  Exam reveals no gallop and no friction rub.   No murmur heard. Respiratory: Effort normal and breath sounds  normal.  GI: Soft. Bowel sounds are normal. She exhibits no distension. There is no tenderness.  Genitourinary:  Genitourinary Comments: Deferred  Musculoskeletal: Normal range of motion. She exhibits no edema.  Lymphadenopathy:    She has no cervical adenopathy.  Neurological: She is alert and oriented to person, place, and time. No cranial nerve deficit. She exhibits normal muscle tone.  Skin: Skin is warm and dry. No rash noted. No erythema.  Psychiatric: She has a normal mood and affect. Her behavior is normal. Judgment and thought content normal.     Assessment/Plan This is an 82 year old female admitted for weakness and hyponatremia. 1. Weakness: Secondary to deconditioning as well as hyponatremia. Prior to discharge assess her physical therapy and occupational therapy needs. 2. Hyponatremia: Likely secondary to call abuse. Hydrate with normal saline 3. Alcohol abuse: CIWA scale and Ativan as needed. Continue folic acid and thiamine supplementation 4. Elevated troponin: Possibly secondary to muscle inflammation. Check CK-MB and creatinine kinase. EKG without evidence of myocardial ischemia. 5. DVT prophylaxis: Lovenox 6. GI prophylaxis: None The patient is a full code. Time spent on admission orders and patient care approximately 45 minutes  Harrie Foreman, MD 12/05/2015, 8:00 AM

## 2015-12-05 NOTE — ED Notes (Addendum)
Oxygen applied to pt after oxygen saturation dropped into the 80s while pt was sleeping. Pt placed on 2L [place].

## 2015-12-05 NOTE — Care Management Obs Status (Signed)
[facility] NOTIFICATION   Patient Details  Name: Alyssa Ortega MRN: [id number] Date of Birth: October 02, 1930   Medicare Observation Status Notification Given:  Yes CM did not feel patient could understand the explanation of the notice.  Left the notice in the patient's room with contact information on the notice so when family visits they can contact.  No one knows when family may visit today   Katrina Stack, RN 12/05/2015, 3:14 PM

## 2015-12-05 NOTE — Progress Notes (Signed)
[facility] NAME: Alyssa Ortega    MR#:  [id number]  DATE OF BIRTH:  1930-12-27  SUBJECTIVE:  Came in with chest pain on and off for several weeks, poor appetite and vmiting this am with epigastric pain No cardiac history Pt does not take any meds but tylenol Drinks 2 beers on daily basis  REVIEW OF SYSTEMS:   Review of Systems  Constitutional: Positive for malaise/fatigue. Negative for chills, fever and weight loss.  HENT: Negative for ear discharge, ear pain and nosebleeds.   Eyes: Negative for blurred vision, pain and discharge.  Respiratory: Positive for cough. Negative for sputum production, shortness of breath, wheezing and stridor.   Cardiovascular: Negative for chest pain, palpitations, orthopnea and PND.  Gastrointestinal: Positive for abdominal pain and vomiting. Negative for diarrhea and nausea.  Genitourinary: Negative for frequency and urgency.  Musculoskeletal: Negative for back pain and joint pain.  Neurological: Positive for weakness. Negative for sensory change, speech change and focal weakness.  Psychiatric/Behavioral: Negative for depression and hallucinations. The patient is not nervous/anxious.   All other systems reviewed and are negative.  Tolerating Diet: Tolerating PT:   DRUG ALLERGIES:   Allergies  Allergen Reactions  . Aspirin Hives and Other (See Comments)    Nosebleed per patient    VITALS:  Blood pressure (!) 141/78, pulse (!) 102, temperature 97.8 F (36.6 C), temperature source Oral, resp. rate 20, height 5\' 2"  (1.575 m), weight 52.6 kg (116 lb), SpO2 94 %.  PHYSICAL EXAMINATION:   Physical Exam  GENERAL:  82 y.o.-year-old patient lying in the bed with no acute distress. Thin, weak EYES: Pupils equal, round, reactive to light and accommodation. No scleral icterus. Extraocular muscles intact.  HEENT: Head atraumatic, normocephalic. Oropharynx and nasopharynx clear.  NECK:  Supple,  no jugular venous distention. No thyroid enlargement, no tenderness.  LUNGS: Normal breath sounds bilaterally, no wheezing, rales, rhonchi. No use of accessory muscles of respiration.  CARDIOVASCULAR: S1, S2 normal. No murmurs, rubs, or gallops.  ABDOMEN: Soft, nontender, nondistended. Bowel sounds present. No organomegaly or mass.  EXTREMITIES: No cyanosis, clubbing or edema b/l.    NEUROLOGIC: Cranial nerves II through XII are intact. No focal Motor or sensory deficits b/l.   PSYCHIATRIC:  patient is alert and oriented x 3.  SKIN: No obvious rash, lesion, or ulcer.   LABORATORY PANEL:  CBC  Recent Labs Lab 12/18/2015 2218  WBC 9.6  HGB 13.6  HCT 38.8  PLT 137*    Chemistries   Recent Labs Lab 12/06/2015 2218  NA 126*  K 3.7  CL 91*  CO2 25  GLUCOSE 87  BUN 9  CREATININE 0.57  CALCIUM 8.9   Cardiac Enzymes  Recent Labs Lab 12/25/2015 2218  TROPONINI 0.03*   RADIOLOGY:  Dg Chest 2 View  Result Date: 12/18/2015 CLINICAL DATA:  Acute onset of generalized chest pain and weakness. Initial encounter. EXAM: CHEST  2 VIEW COMPARISON:  Chest radiograph from 11/12/2015 FINDINGS: The lungs are well-aerated. Peribronchial thickening is noted. Left basilar scarring is seen. There is no evidence of pleural effusion or pneumothorax. The heart is borderline normal in size. No acute osseous abnormalities are seen. Multiple chronic compression deformities are noted along the thoracic and upper lumbar spine, with associated changes of vertebroplasty at multiple levels. IMPRESSION: Peribronchial thickening noted.  Left basilar scarring seen. Electronically Signed   By: Garald Balding M.D.   On: 12/11/2015 22:49   ASSESSMENT AND  PLAN:  82 year old female admitted for weakness and hyponatremia.  1. Weakness: Secondary to deconditioning as well as hyponatremia. Prior to discharge assess her physical therapy and occupational therapy needs. Pt has had poor appetite and drinks 2 beers (big cans)  daily -IVF and watch sodium -advised to stop drinking beer  2. Vomiting with epigastric pain suggest gastritis -IV pepcid bid and po ppi -gi w/u as out pt  3. Alcohol abuse: CIWA scale and Ativan as needed. Continue folic acid and thiamine supplementation  4. Elevated troponin: Possibly secondary to muscle inflammation. Check CK-MB and creatinine kinase.  EKG without evidence of myocardial ischemia. -appears more GI cause likely gastritis -cont trop trending. -hold off cardiology consult for now  5. DVT prophylaxis: Lovenox  PT to see pt CSW/CM for d/c planning  Case discussed with Care Management/Social Worker. Management plans discussed with the patient, family and they are in agreement.  CODE STATUS: full  DVT Prophylaxis: lovenox   TOTAL TIME TAKING CARE OF THIS PATIENT:30 minutes.  >50% time spent on counselling and coordination of care  POSSIBLE D/C IN 1-2 DAYS, DEPENDING ON CLINICAL CONDITION.  Note: This dictation was prepared with Dragon dictation along with smaller phrase technology. Any transcriptional errors that result from this process are unintentional.  Ryne Mctigue M.D on 12/05/2015 at 11:53 AM  Between 7am to 6pm - Pager - [phone]  After 6pm go to [email or web address] - password [email or web address] [facility]  Office  [phone]  CC: Primary care physician; Park Liter, DO

## 2015-12-05 NOTE — ED Provider Notes (Signed)
[facility] Emergency Department Provider Note   ____________________________________________   First MD Initiated Contact with Patient 12/05/15 0005     (approximate)  I have reviewed the triage vital signs and the nursing notes.   HISTORY  Chief Complaint Chest Pain    HPI Alyssa Ortega is a 82 y.o. female who presents to the ED from home via EMS with the chief complaint of chest pain, nausea, cough and generalized weakness. Patient with a history of COPD, hyponatremia, failure to thrive who reports chest pain for the past week, worsening tonight. Describes central chest tightness associated with nausea. Reports dry cough without fevers, chills or diaphoresis. Denies abdominal pain, vomiting, dysuria, diarrhea. Denies recent travel or trauma. Nothing makes her symptoms better or worse.   Past Medical History:  Diagnosis Date  . Bursitis   . Dehydration   . Gastritis   . GI bleed 10/2007   secondary to duodenal ulcers  . Hip fracture ([place]) 3/09   left, s/p hemiarthroplasty  . History of epistaxis    had MRI by Dr. Carlis Abbott  . Hyperlipidemia   . Hypertension   . Insomnia   . Osteoporosis   . Rib fractures    traumatic    Patient Active Problem List   Diagnosis Date Noted  . Hyponatremia 08/17/2015  . Compression fracture of lumbar spine, non-traumatic ([place]) 05/18/2015  . Adult failure to thrive 05/18/2015  . COPD (chronic obstructive pulmonary disease) ([place]) 04/15/2015  . Malnutrition ([place]) 03/18/2015  . Trochanteric bursitis of right hip 10/20/2014  . Benign essential HTN 08/05/2014  . Hypercholesterolemia without hypertriglyceridemia 08/05/2014  . Cellulitis and abscess of leg 07/01/2014  . Wedge fracture of lumbar vertebra ([place]) 09/30/2013  . Screening for breast cancer 12/22/2010  . Alcohol abuse, episodic 12/22/2010  . Weight loss, non-intentional 12/22/2010  . Tinnitus of left ear 12/22/2010  . Thrombocytopenia ([place]) 12/22/2010    . Osteoporosis with fracture   . Rib fractures   . History of epistaxis   . GI bleed 10/29/2007    Past Surgical History:  Procedure Laterality Date  . ABDOMINAL HYSTERECTOMY    . BACK SURGERY    . HEMIARTHROPLASTY HIP  3/09, 10/11   left hip  . JOINT REPLACEMENT  2009   Left hip    Prior to Admission medications   Medication Sig Start Date End Date Taking? Authorizing Provider  acetaminophen (TYLENOL) 500 MG tablet Take 1 tablet (500 mg total) by mouth every 6 (six) hours as needed for mild pain, moderate pain, fever or headache. 08/18/15  Yes Gladstone Lighter, MD  albuterol (PROVENTIL HFA;VENTOLIN HFA) 108 (90 Base) MCG/ACT inhaler Inhale 2 puffs into the lungs every 6 (six) hours as needed for wheezing or shortness of breath. 10/12/15  Yes Megan P Johnson, DO  feeding supplement, ENSURE ENLIVE, (ENSURE ENLIVE) LIQD Take 237 mLs by mouth 2 (two) times daily between meals. 07/03/14  Yes Fritzi Mandes, MD  fexofenadine [facility] ALLERGY) 180 MG tablet Take 1 tablet (180 mg total) by mouth daily. 11/18/15  Yes Megan P Johnson, DO  nitrofurantoin, macrocrystal-monohydrate, (MACROBID) 100 MG capsule Take 1 capsule (100 mg total) by mouth 2 (two) times daily. 11/18/15  Yes Megan P Johnson, DO    Allergies Aspirin  Family History  Problem Relation Age of Onset  . Cancer Son 40    colon  . Arthritis Brother     Social History Social History  Substance Use Topics  . Smoking status: Former Smoker  Types: Cigarettes    Quit date: 12/21/1990  . Smokeless tobacco: Never Used  . Alcohol use Yes     Comment: occasional    Review of Systems  Constitutional: Positive for generalized malaise. No fever/chills. Eyes: No visual changes. ENT: No sore throat. Cardiovascular: Positive for chest pain. Respiratory: Positive for nonproductive cough. Denies shortness of breath. Gastrointestinal: No abdominal pain.  Positive for nausea, no vomiting.  No diarrhea.  No  constipation. Genitourinary: Negative for dysuria. Musculoskeletal: Negative for back pain. Skin: Negative for rash. Neurological: Negative for headaches, focal weakness or numbness.  10-point ROS otherwise negative.  ____________________________________________   PHYSICAL EXAM:  VITAL SIGNS: ED Triage Vitals  Enc Vitals Group     BP 11/29/2015 2217 (!) 137/92     Pulse --      Resp 12/26/2015 2217 18     Temp 12/08/2015 2221 97.8 F (36.6 C)     Temp Source 12/06/2015 2221 Oral     SpO2 12/13/2015 2221 97 %     Weight 12/01/2015 2216 116 lb (52.6 kg)     Height 12/02/2015 2216 5\' 2"  (1.575 m)     Head Circumference --      Peak Flow --      Pain Score 12/01/2015 2216 10     Pain Loc --      Pain Edu? --      Excl. in [place]? --     Constitutional: Alert and oriented. Frail appearing and in mild acute distress. Eyes: Conjunctivae are normal. PERRL. EOMI. Head: Atraumatic. Nose: No congestion/rhinnorhea. Mouth/Throat: Mucous membranes are moist.  Oropharynx non-erythematous. Neck: No stridor.   Cardiovascular: Tachycardic rate, regular rhythm. Grossly normal heart sounds.  Good peripheral circulation. Respiratory: Normal respiratory effort.  No retractions. Lungs with scattered rhonchi. Gastrointestinal: Soft and nontender. No distention. No abdominal bruits. No CVA tenderness. Musculoskeletal: No lower extremity tenderness nor edema.  No joint effusions. Neurologic:  Normal speech and language. No gross focal neurologic deficits are appreciated.  Skin:  Skin is warm, dry and intact. No rash noted. Psychiatric: Mood and affect are normal. Speech and behavior are normal.  ____________________________________________   LABS (all labs ordered are listed, but only abnormal results are displayed)  Labs Reviewed  BASIC METABOLIC PANEL - Abnormal; Notable for the following:       Result Value   Sodium 126 (*)    Chloride 91 (*)    All other components within normal limits  CBC -  Abnormal; Notable for the following:    Platelets 137 (*)    All other components within normal limits  TROPONIN I - Abnormal; Notable for the following:    Troponin I 0.03 (*)    All other components within normal limits   ____________________________________________  EKG  ED ECG REPORT I, SUNG,JADE J, the attending physician, personally viewed and interpreted this ECG.   Date: 12/05/2015  EKG Time: 2217  Rate: 111  Rhythm: sinus tachycardia  Axis: LAD  Intervals:left anterior fascicular block  ST&T Change: T-wave inversion inferior lateral leads  ____________________________________________  RADIOLOGY  Chest 2 view (view by me, interpreted per Dr. Radene Knee): Peribronchial thickening noted. Left basilar scarring seen. ____________________________________________   PROCEDURES  Procedure(s) performed: None  Procedures  Critical Care performed: No  ____________________________________________   INITIAL IMPRESSION / ASSESSMENT AND PLAN / ED COURSE  Pertinent labs & imaging results that were available during my care of the patient were reviewed by me and considered in my medical decision making (  see chart for details).  82 year old female with a history of COPD, hyponatremia, failure to thrive who presents with generalized weakness, cough, nausea and chest pain. Initial laboratory results remarkable for mildly elevated troponin and hyponatremia. Will infuse IV fluids, apply Nitropaste and discuss with hospitalist to evaluate patient in the emergency department for admission.  Clinical Course     ____________________________________________   FINAL CLINICAL IMPRESSION(S) / ED DIAGNOSES  Final diagnoses:  Chest pain, unspecified type  Hyponatremia  Weakness      NEW MEDICATIONS STARTED DURING THIS VISIT:  New Prescriptions   No medications on file     Note:  This document was prepared using Dragon voice recognition software and may include  unintentional dictation errors.    Paulette Blanch, MD 12/05/15 0700

## 2015-12-06 ENCOUNTER — Observation Stay: Payer: Commercial Managed Care - HMO

## 2015-12-06 ENCOUNTER — Ambulatory Visit: Payer: Commercial Managed Care - HMO | Admitting: Family Medicine

## 2015-12-06 DIAGNOSIS — E440 Moderate protein-calorie malnutrition: Secondary | ICD-10-CM | POA: Insufficient documentation

## 2015-12-06 DIAGNOSIS — R112 Nausea with vomiting, unspecified: Secondary | ICD-10-CM

## 2015-12-06 DIAGNOSIS — R079 Chest pain, unspecified: Secondary | ICD-10-CM

## 2015-12-06 LAB — TROPONIN I
TROPONIN I: 0.03 ng/mL — AB (ref <0.03)
Troponin I: <0.03 ng/mL (ref <0.03)

## 2015-12-06 LAB — BASIC METABOLIC PANEL
Anion gap: 9 (ref 5–15)
BUN: 6 mg/dL (ref 6–20)
CHLORIDE: 98 mmol/L — AB (ref 101–111)
CO2: 26 mmol/L (ref 22–32)
Calcium: 8.5 mg/dL — ABNORMAL LOW (ref 8.9–10.3)
Creatinine, Ser: 0.52 mg/dL (ref 0.44–1.00)
Glucose, Bld: 60 mg/dL — ABNORMAL LOW (ref 65–99)
Potassium: 3.8 mmol/L (ref 3.5–5.1)
SODIUM: 133 mmol/L — AB (ref 135–145)

## 2015-12-06 LAB — HEMOGLOBIN A1C
Hgb A1c MFr Bld: 4.8 % (ref 4.8–5.6)
Mean Plasma Glucose: 91 mg/dL

## 2015-12-06 MED ORDER — MORPHINE SULFATE (PF) 2 MG/ML IV SOLN
2.0000 mg | INTRAVENOUS | Status: DC | PRN
  Administered 2015-12-06 (×3): 2 mg via INTRAVENOUS
  Filled 2015-12-06 (×3): qty 1

## 2015-12-06 NOTE — Plan of Care (Signed)
Problem: Nutrition: Goal: Adequate nutrition will be maintained Outcome: Not Progressing Pt with N/V. Unable to keep anything on stomach. IV phenergan and zofran given

## 2015-12-06 NOTE — Progress Notes (Signed)
While Ch was making his rounds he visited pt in [facility] [address]. Provided the ministry of prayer and spiritual support to family. Ch is available for follow up as needed.    12/06/15 1200  Clinical Encounter Type  Visited With Patient;Patient and family together  Visit Type Initial;Spiritual support  Referral From Nurse  Spiritual Encounters  Spiritual Needs Prayer;Emotional

## 2015-12-06 NOTE — Progress Notes (Signed)
PT recommend SNF placement for patient at discharge. There is a Palliative Care consult pending. Spoke to Palliative Care RN about patient. CSW will continue to follow patient to determine if she's appropriate for SNF placement and to assist with discharge needs.  Ernest Pine, MSW, LCSW, [place] Clinical Social Worker [phone]

## 2015-12-06 NOTE — Consult Note (Signed)
Alyssa Lame, MD [address] [place], [place] [address] Phone: [phone] Fax : [phone]  Consultation  Referring Provider:     No ref. provider found Primary Care Physician:  Park Liter, DO Primary Gastroenterologist:  None         Reason for Consultation:     Nausea and vomiting  Date of Admission:  12/22/2015 Date of Consultation:  12/06/2015         HPI:   Alyssa Ortega is a 82 y.o. female who was admitted with chest pain but denied chest pain on admission. The patient has had a poor appetite and reports that she has been vomiting with epigastric pain. The patient is not a very good historian and very hard to understand.  From the chart it appears the patient has had a history of daily alcohol use. The patient does report some  Chest pain in her left side she also claims a 60 pound weight loss in the last 2 years that has been without trying.  Past Medical History:  Diagnosis Date  . Bursitis   . Dehydration   . Gastritis   . GI bleed 10/2007   secondary to duodenal ulcers  . Hip fracture ([place]) 3/09   left, s/p hemiarthroplasty  . History of epistaxis    had MRI by Dr. Carlis Abbott  . Hyperlipidemia   . Hypertension   . Insomnia   . Osteoporosis   . Rib fractures    traumatic    Past Surgical History:  Procedure Laterality Date  . ABDOMINAL HYSTERECTOMY    . BACK SURGERY    . HEMIARTHROPLASTY HIP  3/09, 10/11   left hip  . JOINT REPLACEMENT  2009   Left hip    Prior to Admission medications   Medication Sig Start Date End Date Taking? Authorizing Provider  acetaminophen (TYLENOL) 500 MG tablet Take 1 tablet (500 mg total) by mouth every 6 (six) hours as needed for mild pain, moderate pain, fever or headache. 08/18/15  Yes Gladstone Lighter, MD  feeding supplement, ENSURE ENLIVE, (ENSURE ENLIVE) LIQD Take 237 mLs by mouth 2 (two) times daily between meals. 07/03/14  Yes Fritzi Mandes, MD  fexofenadine [facility] ALL[facility]) 180 MG tablet Take 1 tablet (180  mg total) by mouth daily. 11/18/15  Yes Megan P Johnson, DO  albuterol (PROVENTIL HFA;VENTOLIN HFA) 108 (90 Base) MCG/ACT inhaler Inhale 2 puffs into the lungs every 6 (six) hours as needed for wheezing or shortness of breath. 10/12/15   Megan Annia Friendly, DO    Family History  Problem Relation Age of Onset  . Cancer Son 40    colon  . Arthritis Brother      Social History  Substance Use Topics  . Smoking status: Former Smoker    Types: Cigarettes    Quit date: 12/21/1990  . Smokeless tobacco: Never Used  . Alcohol use Yes     Comment: occasional    Allergies as of 12/14/2015 - Review Complete 12/02/2015  Allergen Reaction Noted  . Aspirin Hives and Other (See Comments) 07/01/2014    Review of Systems:    All systems reviewed and negative except where noted in HPI.   Physical Exam:  Vital signs in last 24 hours: Temp:  [97.8 F (36.6 C)-98 F (36.7 C)] 97.8 F (36.6 C) (10/09 2041) Pulse Rate:  [109-110] 110 (10/09 2041) Resp:  [16-20] 16 (10/09 2041) BP: (110-151)/(54-74) 151/70 (10/09 2041) SpO2:  [90 %-100 %] 90 % (10/09 2041)  Last BM Date: 12/26/2015 General:   Pleasant, cooperative in NAD Head:  Normocephalic and atraumatic. Eyes:   No icterus.   Conjunctiva pink. PERRLA. Ears:  Normal auditory acuity. Neck:  Supple; no masses or thyroidomegaly Lungs: Respirations even and unlabored. Lungs clear to auscultation bilaterally.   No wheezes, crackles, or rhonchi.  Heart:  Regular rate and rhythm;  Without murmur, clicks, rubs or gallops Abdomen:  Soft, nondistended, nontender. Normal bowel sounds. No appreciable masses or hepatomegaly.  No rebound or guarding.  Rectal:  Not performed. Msk:  Symmetrical without gross deformities.    Extremities:  Without edema, cyanosis or clubbing. Neurologic:  Alert and oriented x3;  grossly normal neurologically. Skin:  Intact without significant lesions or rashes. Cervical Nodes:  No significant cervical adenopathy. Psych:  Alert  and cooperative. Normal affect.  LAB RESULTS:  Recent Labs  12/19/2015 2218  WBC 9.6  HGB 13.6  HCT 38.8  PLT 137*   BMET  Recent Labs  12/09/2015 2218 12/06/15 0502  NA 126* 133*  K 3.7 3.8  CL 91* 98*  CO2 25 26  GLUCOSE 87 60*  BUN 9 6  CREATININE 0.57 0.52  CALCIUM 8.9 8.5*   LFT No results for input(s): PROT, ALBUMIN, AST, ALT, ALKPHOS, BILITOT, BILIDIR, IBILI in the last 72 hours. PT/INR No results for input(s): LABPROT, INR in the last 72 hours.  STUDIES: Ct Abdomen Pelvis Wo Contrast  Result Date: 12/06/2015 CLINICAL DATA:  Chest pain, nausea, vomiting for 1 week EXAM: CT ABDOMEN AND PELVIS WITHOUT CONTRAST TECHNIQUE: Multidetector CT imaging of the abdomen and pelvis was performed following the standard protocol without IV contrast. COMPARISON:  None. FINDINGS: Lower chest: Small right pleural effusion. No left pleural effusion. Bibasilar atelectasis. Hepatobiliary: New no focal hepatic mass. Slightly nodular contour of the liver as can be seen with hepatic cellular disease such as cirrhosis. Normal gallbladder. Pancreas: Unremarkable. No pancreatic ductal dilatation or surrounding inflammatory changes. Spleen: Normal in size without focal abnormality. Adrenals/Urinary Tract: Adrenal glands are unremarkable. Kidneys are normal, without renal calculi, focal lesion, or hydronephrosis. Bladder is unremarkable. Stomach/Bowel: Stomach is within normal limits. No evidence of bowel wall thickening, distention, or inflammatory changes. Intraluminal fat density mass in the ascending colon most consistent with a lipoma measuring 2.1 cm. Lipomatosis hypertrophy of the ileocecal valve. Vascular/Lymphatic: Abdominal aortic atherosclerosis. No lymphadenopathy. Reproductive: Status post hysterectomy. No adnexal masses. Other: No fluid collection or hematoma. Musculoskeletal: Bilateral total hip arthroplasties resulting in beam hardening artifact partially obscuring the lower pelvis. No acute  osseous abnormality. Chronic L2, L3, L4 and L5 vertebral body compression fractures with methylmethacrylate within the vertebral bodies. Epidural extension of methylmethacrylate along the right paracentral aspect of the L2 vertebral body narrowing the right lateral recess. IMPRESSION: 1. No acute abdominal or pelvic pathology.  No bowel obstruction. 2. Intraluminal fat density mass in the ascending colon most consistent with a lipoma measuring 2.1 cm. 3. Small right pleural effusion. Electronically Signed   By: Kathreen Devoid   On: 12/06/2015 12:26   Dg Chest 2 View  Result Date: 12/24/2015 CLINICAL DATA:  Acute onset of generalized chest pain and weakness. Initial encounter. EXAM: CHEST  2 VIEW COMPARISON:  Chest radiograph from 11/12/2015 FINDINGS: The lungs are well-aerated. Peribronchial thickening is noted. Left basilar scarring is seen. There is no evidence of pleural effusion or pneumothorax. The heart is borderline normal in size. No acute osseous abnormalities are seen. Multiple chronic compression deformities are noted along the thoracic and upper  lumbar spine, with associated changes of vertebroplasty at multiple levels. IMPRESSION: Peribronchial thickening noted.  Left basilar scarring seen. Electronically Signed   By: Garald Balding M.D.   On: 12/28/2015 22:49   Dg Abd Portable 1v  Result Date: 12/06/2015 CLINICAL DATA:  Pt admitted for hyponatremia. Pt with complaints of abdominal pain and has been vomiting; Pt with history of alcohol abuse drinks 2 beers daily, HTN, and gastritis EXAM: PORTABLE ABDOMEN - 1 VIEW COMPARISON:  CT, 08/17/2015 FINDINGS: Normal bowel gas pattern.  No evidence of obstruction. No convincing renal or ureteral stones. There are atherosclerotic calcifications noted along a tortuous abdominal aorta. Bones are demineralized. There are bilateral hip arthroplasties and vertebroplasty has been performed at lower lumbar levels. IMPRESSION: 1. No acute findings.  No evidence  bowel obstruction. Electronically Signed   By: Lajean Manes M.D.   On: 12/06/2015 08:21      Impression / Plan:   Alyssa Ortega is a 82 y.o. y/o female with an admission for chest pain and elevated troponin.  The patient troponin was only mildly elevated and not associated with any changes in the EKG. The patient has intractable nausea and vomiting and vomited multiple times while being examined.  The patient will be set up for an EGD to rule out any sort of gastric outlet obstruction or cause for her intractable nausea and vomiting.  The patient has been explained the plan and agrees with it.  Thank you for involving me in the care of this patient.      LOS: 0 days   Alyssa Lame, MD  12/06/2015, 8:52 PM   Note: This dictation was prepared with Dragon dictation along with smaller phrase technology. Any transcriptional errors that result from this process are unintentional.

## 2015-12-06 NOTE — Progress Notes (Signed)
Patient continues to have nausea/vomiting (clear, thick mucus) and have upper/mid abdominal pain that worsens after any PO intake. Minimal PO intake today. PRN nausea and pain medications given throughout the day with noted temporary relief. Dr. Manuella Ghazi aware. Awaiting GI and Palliative consults for further plan of care.

## 2015-12-06 NOTE — Progress Notes (Addendum)
[facility] [place] NAME: Alyssa Ortega    MR#:  [id number]  DATE OF BIRTH:  02-04-1931  SUBJECTIVE:  Patient continues to have nausea and vomiting, still in a lot of pain, her friend is at bedside.  She claims about 60 pound weight loss in the last 2 years unintentional REVIEW OF SYSTEMS:   Review of Systems  Constitutional: Positive for malaise/fatigue. Negative for chills, fever and weight loss.  HENT: Negative for ear discharge, ear pain and nosebleeds.   Eyes: Negative for blurred vision, pain and discharge.  Respiratory: Positive for cough. Negative for sputum production, shortness of breath, wheezing and stridor.   Cardiovascular: Negative for chest pain, palpitations, orthopnea and PND.  Gastrointestinal: Positive for abdominal pain and vomiting. Negative for diarrhea and nausea.  Genitourinary: Negative for frequency and urgency.  Musculoskeletal: Negative for back pain and joint pain.  Neurological: Positive for weakness. Negative for sensory change, speech change and focal weakness.  Psychiatric/Behavioral: Negative for depression and hallucinations. The patient is not nervous/anxious.   All other systems reviewed and are negative.  Tolerating Diet: NO, Very nausea. Tolerating PT: pending DRUG ALLERGIES:   Allergies  Allergen Reactions  . Aspirin Hives and Other (See Comments)    Nosebleed per patient    VITALS:  Blood pressure (!) 110/54, pulse (!) 109, temperature 98 F (36.7 C), temperature source Oral, resp. rate 20, height 5\' 2"  (1.575 m), weight 52.6 kg (116 lb), SpO2 92 %.  PHYSICAL EXAMINATION:   Physical Exam  GENERAL:  82 y.o.-year-old patient lying in the bed with no acute distress. Thin, weak, frail EYES: Pupils equal, round, reactive to light and accommodation. No scleral icterus. Extraocular muscles intact.  HEENT: Head atraumatic, normocephalic. Oropharynx and nasopharynx clear.  NECK:  Supple, no  jugular venous distention. No thyroid enlargement, no tenderness.  LUNGS: Normal breath sounds bilaterally, no wheezing, rales, rhonchi. No use of accessory muscles of respiration.  CARDIOVASCULAR: S1, S2 normal. No murmurs, rubs, or gallops.  ABDOMEN: Soft, Generalized tenderness, distended +. Bowel sounds present. No organomegaly or mass.  EXTREMITIES: No cyanosis, clubbing or edema b/l.    NEUROLOGIC: Cranial nerves II through XII are intact. No focal Motor or sensory deficits b/l.   PSYCHIATRIC:  patient is alert and oriented x 3.  SKIN: No obvious rash, lesion, or ulcer.  LABORATORY PANEL:  CBC  Recent Labs Lab 12/14/2015 2218  WBC 9.6  HGB 13.6  HCT 38.8  PLT 137*    Chemistries   Recent Labs Lab 12/06/15 0502  NA 133*  K 3.8  CL 98*  CO2 26  GLUCOSE 60*  BUN 6  CREATININE 0.52  CALCIUM 8.5*   Cardiac Enzymes  Recent Labs Lab 12/06/15 1141  TROPONINI <0.03   RADIOLOGY:  Ct Abdomen Pelvis Wo Contrast  Result Date: 12/06/2015 CLINICAL DATA:  Chest pain, nausea, vomiting for 1 week EXAM: CT ABDOMEN AND PELVIS WITHOUT CONTRAST TECHNIQUE: Multidetector CT imaging of the abdomen and pelvis was performed following the standard protocol without IV contrast. COMPARISON:  None. FINDINGS: Lower chest: Small right pleural effusion. No left pleural effusion. Bibasilar atelectasis. Hepatobiliary: New no focal hepatic mass. Slightly nodular contour of the liver as can be seen with hepatic cellular disease such as cirrhosis. Normal gallbladder. Pancreas: Unremarkable. No pancreatic ductal dilatation or surrounding inflammatory changes. Spleen: Normal in size without focal abnormality. Adrenals/Urinary Tract: Adrenal glands are unremarkable. Kidneys are normal, without renal calculi, focal lesion, or hydronephrosis.  Bladder is unremarkable. Stomach/Bowel: Stomach is within normal limits. No evidence of bowel wall thickening, distention, or inflammatory changes. Intraluminal fat density  mass in the ascending colon most consistent with a lipoma measuring 2.1 cm. Lipomatosis hypertrophy of the ileocecal valve. Vascular/Lymphatic: Abdominal aortic atherosclerosis. No lymphadenopathy. Reproductive: Status post hysterectomy. No adnexal masses. Other: No fluid collection or hematoma. Musculoskeletal: Bilateral total hip arthroplasties resulting in beam hardening artifact partially obscuring the lower pelvis. No acute osseous abnormality. Chronic L2, L3, L4 and L5 vertebral body compression fractures with methylmethacrylate within the vertebral bodies. Epidural extension of methylmethacrylate along the right paracentral aspect of the L2 vertebral body narrowing the right lateral recess. IMPRESSION: 1. No acute abdominal or pelvic pathology.  No bowel obstruction. 2. Intraluminal fat density mass in the ascending colon most consistent with a lipoma measuring 2.1 cm. 3. Small right pleural effusion. Electronically Signed   By: Kathreen Devoid   On: 12/06/2015 12:26   Dg Chest 2 View  Result Date: 12/02/2015 CLINICAL DATA:  Acute onset of generalized chest pain and weakness. Initial encounter. EXAM: CHEST  2 VIEW COMPARISON:  Chest radiograph from 11/12/2015 FINDINGS: The lungs are well-aerated. Peribronchial thickening is noted. Left basilar scarring is seen. There is no evidence of pleural effusion or pneumothorax. The heart is borderline normal in size. No acute osseous abnormalities are seen. Multiple chronic compression deformities are noted along the thoracic and upper lumbar spine, with associated changes of vertebroplasty at multiple levels. IMPRESSION: Peribronchial thickening noted.  Left basilar scarring seen. Electronically Signed   By: Garald Balding M.D.   On: 12/05/2015 22:49   Dg Abd Portable 1v  Result Date: 12/06/2015 CLINICAL DATA:  Pt admitted for hyponatremia. Pt with complaints of abdominal pain and has been vomiting; Pt with history of alcohol abuse drinks 2 beers daily, HTN, and  gastritis EXAM: PORTABLE ABDOMEN - 1 VIEW COMPARISON:  CT, 08/17/2015 FINDINGS: Normal bowel gas pattern.  No evidence of obstruction. No convincing renal or ureteral stones. There are atherosclerotic calcifications noted along a tortuous abdominal aorta. Bones are demineralized. There are bilateral hip arthroplasties and vertebroplasty has been performed at lower lumbar levels. IMPRESSION: 1. No acute findings.  No evidence bowel obstruction. Electronically Signed   By: Lajean Manes M.D.   On: 12/06/2015 08:21   ASSESSMENT AND PLAN:  82 year old female admitted for weakness and hyponatremia.  1. Weakness: Secondary to deconditioning as well as hyponatremia. Prior to discharge assess her physical therapy and occupational therapy needs. Pt has had poor appetite and drinks 2 beers (big cans) daily - continue IVF and watch sodium.  Sodium 133 today -advised to stop drinking beer  2. Vomiting with epigastric pain suggest gastritis -IV pepcid bid  - We will check CT scan without contrast as she is not able to tolerate oral contrast to look for any acute findings - negative for any acute patho but limited study without contrast  3. Alcohol abuse: CIWA scale and Ativan as needed. Continue folic acid and thiamine supplementation  4. Elevated troponin: Possibly secondary to demand ischemia muscle inflammation. Check CK-MB and creatinine kinase.  EKG without evidence of myocardial ischemia. -appears more GI cause likely gastritis -cont trop trending. -hold off cardiology consult for now   5.  Failure to thrive - Chronic severe protein calorie malnutrition - As per patient's daughter, she weighed 179 pounds about 2 years ago.  About 3 months ago she was 130 pounds and now she weighs 116 pounds - UnIntentional weight loss -  Poor appetite - She expressed wanting to stay home and not going to any Facility - We will consult palliative care - discussed with patient's daughter, who is agreeable with  hospice if patient qualifies - As per her primary care physician's note patient has had numerous ER visits often for fall - Her daughter is her power of attorney and she is in agreement with above plan     DVT prophylaxis: Lovenox  PT to see pt CSW/CM for d/c planning  Case discussed with Care Management/Social Worker. Management plans discussed with the patient, nursing family (discussed with Daughter Sharol Given on the phone at [phone]) and they are in agreement.  CODE STATUS: DO NOT RESUSCITATE  DVT Prophylaxis: lovenox   TOTAL TIME TAKING CARE OF THIS PATIENT:30 minutes.  >50% time spent on counselling and coordination of care  POSSIBLE D/C IN 1-2 DAYS, DEPENDING ON CLINICAL CONDITION. palliative care evaluation.  May Qualify for hospice, consider discharge home with hospice  Note: This dictation was prepared with Dragon dictation along with smaller phrase technology. Any transcriptional errors that result from this process are unintentional.  [facility], Mecca Barga M.D on 12/06/2015 at 1:53 PM  Between 7am to 6pm - Pager - [phone]  After 6pm go to [email or web address] - password [email or web address] [facility]  Office  [phone]  CC: Primary care physician; Park Liter, DO

## 2015-12-06 NOTE — Progress Notes (Signed)
Dr. Manuella Ghazi notified patient complaining of abdominal/epigastric pain 8/10. Verbal order read back and verified for Morphine 2mg  Q4H PRN.

## 2015-12-06 NOTE — Progress Notes (Addendum)
Patient continues to have nausea and vomiting (thick, clear mucus). Zofran PRN, Protonix, Pepcid all given as ordered.   Abdominal xray this morning shows "No acute findings.  No evidence bowel obstruction"  Will continue to closely monitor.

## 2015-12-06 NOTE — Progress Notes (Signed)
Initial Nutrition Assessment  DOCUMENTATION CODES:   Non-severe (moderate) malnutrition in context of chronic illness  INTERVENTION:  -Agree with ensure enlive being added for additional kcals and protein.  Pt did report that she likes ensure.   -Await diet progression, intake and poc.   NUTRITION DIAGNOSIS:   Malnutrition related to poor appetite, nausea, vomiting as evidenced by percent weight loss, energy intake < 75% for > or equal to 1 month, mild depletion of body fat, moderate depletion of body fat, mild depletion of muscle mass, moderate depletions of muscle mass.    GOAL:   Patient will meet greater than or equal to 90% of their needs    MONITOR:   PO intake, Supplement acceptance, Diet advancement, Weight trends  REASON FOR ASSESSMENT:   Consult Poor PO  ASSESSMENT:      82 y.o female admitted with chest pain with mild elevated troponin, poor appetite, vomiting, and hyponatremia. Pt with history of etoh use (2 cans beer daily), GI bleed, hip fracture, HLD, HTN, osteoporosis. Noted per nursing documentation no evidenced of bowel obstruction on abdominal xray. Noted Palliative care to evaluate pt.    Pt continues with nausea, vomiting (medication given).  Dtr at bedside and reports intake has been poor for months, only able to take few bites at times.  Does drink 2-3 ensures per day (mostly 2) but not much else (maybe few bites of chicken).     Medications reviewed: colace, folic acid, MVI, thiamine, protonix, NS at 28ml/hr Labs reviewed: Na 133, glucose 60  Noted 13% wt loss in the last 7 months  Nutrition-Focused physical exam completed. Findings are no  fat depletion, mild/moderate to severe muscle depletion, and no edema.     Diet Order:  Diet clear liquid Room service appropriate? Yes; Fluid consistency: Thin  Skin:  Reviewed, no issues  Last BM:  10/7  Height:   Ht Readings from Last 1 Encounters:  12/14/2015 5\' 2"  (1.575 m)    Weight:   Wt  Readings from Last 1 Encounters:  12/15/2015 116 lb (52.6 kg)    Ideal Body Weight:     BMI:  Body mass index is 21.22 kg/m.  Estimated Nutritional Needs:   Kcal:  1500-1800 kcals/d  Protein:  64-80 g/d  Fluid:  >/= 1558ml/d  EDUCATION NEEDS:   No education needs identified at this time  Adwoa Axe B. Zenia Resides, [place], [place] (pager) Weekend/On-Call pager [phone])

## 2015-12-07 ENCOUNTER — Observation Stay: Payer: Commercial Managed Care - HMO

## 2015-12-07 ENCOUNTER — Encounter: Admission: EM | Disposition: E | Payer: Self-pay | Source: Home / Self Care | Attending: Internal Medicine

## 2015-12-07 DIAGNOSIS — R0603 Acute respiratory distress: Secondary | ICD-10-CM | POA: Diagnosis present

## 2015-12-07 DIAGNOSIS — R627 Adult failure to thrive: Secondary | ICD-10-CM | POA: Diagnosis present

## 2015-12-07 DIAGNOSIS — R52 Pain, unspecified: Secondary | ICD-10-CM | POA: Diagnosis not present

## 2015-12-07 DIAGNOSIS — Z96641 Presence of right artificial hip joint: Secondary | ICD-10-CM | POA: Diagnosis present

## 2015-12-07 DIAGNOSIS — K2970 Gastritis, unspecified, without bleeding: Secondary | ICD-10-CM | POA: Diagnosis present

## 2015-12-07 DIAGNOSIS — Z515 Encounter for palliative care: Secondary | ICD-10-CM | POA: Diagnosis not present

## 2015-12-07 DIAGNOSIS — M810 Age-related osteoporosis without current pathological fracture: Secondary | ICD-10-CM | POA: Diagnosis present

## 2015-12-07 DIAGNOSIS — I248 Other forms of acute ischemic heart disease: Secondary | ICD-10-CM | POA: Diagnosis present

## 2015-12-07 DIAGNOSIS — Z87891 Personal history of nicotine dependence: Secondary | ICD-10-CM | POA: Diagnosis not present

## 2015-12-07 DIAGNOSIS — Z886 Allergy status to analgesic agent status: Secondary | ICD-10-CM | POA: Diagnosis not present

## 2015-12-07 DIAGNOSIS — R0600 Dyspnea, unspecified: Secondary | ICD-10-CM

## 2015-12-07 DIAGNOSIS — R079 Chest pain, unspecified: Secondary | ICD-10-CM | POA: Diagnosis present

## 2015-12-07 DIAGNOSIS — Z79899 Other long term (current) drug therapy: Secondary | ICD-10-CM | POA: Diagnosis not present

## 2015-12-07 DIAGNOSIS — E43 Unspecified severe protein-calorie malnutrition: Secondary | ICD-10-CM | POA: Diagnosis present

## 2015-12-07 DIAGNOSIS — F1010 Alcohol abuse, uncomplicated: Secondary | ICD-10-CM | POA: Diagnosis present

## 2015-12-07 DIAGNOSIS — E871 Hypo-osmolality and hyponatremia: Secondary | ICD-10-CM | POA: Diagnosis present

## 2015-12-07 DIAGNOSIS — J449 Chronic obstructive pulmonary disease, unspecified: Secondary | ICD-10-CM | POA: Diagnosis present

## 2015-12-07 DIAGNOSIS — Z66 Do not resuscitate: Secondary | ICD-10-CM | POA: Diagnosis present

## 2015-12-07 DIAGNOSIS — E440 Moderate protein-calorie malnutrition: Secondary | ICD-10-CM | POA: Diagnosis present

## 2015-12-07 DIAGNOSIS — Z6821 Body mass index (BMI) 21.0-21.9, adult: Secondary | ICD-10-CM | POA: Diagnosis not present

## 2015-12-07 LAB — TROPONIN I: Troponin I: 0.03 ng/mL (ref <0.03)

## 2015-12-07 SURGERY — ESOPHAGOGASTRODUODENOSCOPY (EGD) WITH PROPOFOL
Anesthesia: General

## 2015-12-07 MED ORDER — GLYCOPYRROLATE 0.2 MG/ML IJ SOLN
0.2000 mg | INTRAMUSCULAR | Status: DC | PRN
  Administered 2015-12-07 – 2015-12-08 (×2): 0.2 mg via INTRAVENOUS
  Filled 2015-12-07 (×2): qty 1

## 2015-12-07 MED ORDER — FUROSEMIDE 10 MG/ML IJ SOLN
20.0000 mg | Freq: Once | INTRAMUSCULAR | Status: AC
  Administered 2015-12-07: 20 mg via INTRAVENOUS
  Filled 2015-12-07: qty 2

## 2015-12-07 MED ORDER — MORPHINE SULFATE (PF) 2 MG/ML IV SOLN
1.0000 mg | INTRAVENOUS | Status: DC | PRN
  Administered 2015-12-07 – 2015-12-10 (×10): 1 mg via INTRAVENOUS
  Filled 2015-12-07 (×10): qty 1

## 2015-12-07 MED ORDER — MORPHINE SULFATE (PF) 2 MG/ML IV SOLN
1.0000 mg | Freq: Once | INTRAVENOUS | Status: AC
  Administered 2015-12-07: 07:00:00 1 mg via INTRAVENOUS
  Filled 2015-12-07: qty 1

## 2015-12-07 MED ORDER — LORAZEPAM 2 MG/ML IJ SOLN
1.0000 mg | INTRAMUSCULAR | Status: DC | PRN
  Administered 2015-12-08 – 2015-12-09 (×2): 1 mg via INTRAVENOUS
  Filled 2015-12-07 (×2): qty 1

## 2015-12-07 NOTE — Care Management Note (Signed)
Case Management Note  Patient Details  Name: Alyssa Ortega MRN: [id number] Date of Birth: March 02, 1930  Subjective/Objective:         Dr Manuella Ghazi reports that [name] has declined and he recommends Hospice facility if daughter agrees. CSW updated.           Action/Plan:   Expected Discharge Date:                  Expected Discharge Plan:     In-House Referral:     Discharge planning Services     Post Acute Care Choice:    Choice offered to:     DME Arranged:    DME Agency:     HH Arranged:    HH Agency:     Status of Service:     If discussed at [employer] of Stay Meetings, dates discussed:    Additional Comments:  Alter Moss A, RN 12/04/2015, 8:39 AM

## 2015-12-07 NOTE — Progress Notes (Signed)
Pt was lying at foot of bed, 2 nurses went in to reposition. After repositioning pt began frothing from the mouth. Pt's O2 dropped into the 40's, non-rebreather applied. MD called and put in orders for stat CXR, lasix and discontinue fluids. Pt's O2 back to 99%. Awaiting stat EKG and stat troponin.

## 2015-12-07 NOTE — Progress Notes (Signed)
Ch was making his rounds and visited with pt in room 124. Provided prayer support as well emotional support.    12/25/2015 1220  Clinical Encounter Type  Visited With Patient and family together  Visit Type Initial  Referral From Nurse  Spiritual Encounters  Spiritual Needs Prayer;Emotional

## 2015-12-07 NOTE — Consult Note (Signed)
Consultation Note Date: 12/23/2015   Patient Name: Alyssa Ortega  DOB: Mar 13, 1930  MRN: [id number]  Age / Sex: 82 y.o., female  PCP: Alyssa Roys, DO Referring Physician: Max Sane, MD  Reason for Consultation: Establishing goals of care and Hospice Evaluation  HPI/Patient Profile: 82 y.o. female  with past medical history of ETOH abuse, hypertension, hyperlipidemia, osteoporosis, hip fractures, rib fractures, and gastrointestinal bleed admitted on 11/28/2015 with chest pain. Significant hyponatremia and elevated troponin on admission. Vomiting with epigastric pain suggesting gastritis. Failure to thrive with severe protein calorie malnutrition. Per daughter, she has lost over 60 lbs unintentionally in the last two years and has a poor appetite. Significant decline during hospitalization requiring non-rebreather mask. Family has been at bedside and in agreement with withdrawal of care and comfort measures only.    Clinical Assessment and Goals of Care: Alyssa Lessen, NP and I met with patient and family at bedside. Alyssa Ortega (daughter and POA) gives a brief explanation of how her mother has been declining the past two years, has lost 60+ lbs, and has no appetite. She has been independently living at home with family available to check on her. Alyssa Ortega tells me of the worsening decline this hospitalization and that the family agrees that they want her to be comfortable and not suffer. Electing comfort measures only.   Educated on end-of-life symptoms and expectations. They know to ask nursing staff for medications as needed if they see she is uncomfortable. They understand that anything could happen at any time. Discussed my recommendations for monitoring her over night and tomorrow morning and better controlling her symptoms of pain and dyspnea. Discussed that she can wean off of NRB mask to nasal cannula 2L as needed for  comfort. Encouraged frequent oral care. The family has seen and appreciate chaplain services. Provided emotional support.   NEXT OF KIN-daughter Alyssa Ortega POA   SUMMARY OF RECOMMENDATIONS    DNR/DNI  Comfort measures only--discontinued medications and nursing interventions that are not comfort.  Symptom Management--see below  Remove nonrebreather mask. May use nasal cannula up to 2L as needed for comfort.   Educated family on end-of-life symptoms and expectations.   Monitor patient status for the next 24hrs. She may quickly decline when NRB removed. If patient stable, will consider residential hospice placement in AM. Will further discuss with family.   PMT to f/u up tomorrow, 12/08/15  Code Status/Advance Care Planning:  DNR   Symptom Management:   Morphine 5m IV q1h prn pain/dyspnea  Ativan 164mIV q4h prn anxiety/agitation  Robinul 0.23m15mV q4h prn secretions  Zofran 4mg623m q6h prn nausea/vomiting  Palliative Prophylaxis:   Aspiration, Delirium Protocol, Frequent Pain Assessment and Oral Care  Additional Recommendations (Limitations, Scope, Preferences):  Full Comfort Care  Psycho-social/Spiritual:   Desire for further Chaplaincy support:yes  Additional Recommendations: Caregiving  Support/Resources and Education on Hospice  Prognosis:   Hours - Days  Discharge Planning: To Be Determined hospital death vs. Residential hospice     Primary Diagnoses: Present on  Admission: . Hyponatremia . Respiratory distress   I have reviewed the medical record, interviewed the patient and family, and examined the patient. The following aspects are pertinent.  Past Medical History:  Diagnosis Date  . Bursitis   . Dehydration   . Gastritis   . GI bleed 10/2007   secondary to duodenal ulcers  . Hip fracture ([place]) 3/09   left, s/Alyssa hemiarthroplasty  . History of epistaxis    had MRI by Dr. Carlis Ortega  . Hyperlipidemia   . Hypertension   . Insomnia   . Osteoporosis     . Rib fractures    traumatic   Social History   Social History  . Marital status: Widowed    Spouse name: N/A  . Number of children: N/A  . Years of education: N/A   Social History Main Topics  . Smoking status: Former Smoker    Types: Cigarettes    Quit date: 12/21/1990  . Smokeless tobacco: Never Used  . Alcohol use Yes     Comment: occasional  . Drug use: No  . Sexual activity: No   Other Topics Concern  . None   Social History Narrative   Living Situation: lives alone, widowed, then divorced from second, all children by first husband   Family History  Problem Relation Age of Onset  . Cancer Son 40    colon  . Arthritis Brother    Scheduled Meds:  Continuous Infusions:  PRN Meds:.[DISCONTINUED] acetaminophen **OR** acetaminophen, glycopyrrolate, LORazepam, morphine injection, ondansetron **OR** ondansetron (ZOFRAN) IV Medications Prior to Admission:  Prior to Admission medications   Medication Sig Start Date End Date Taking? Authorizing Provider  acetaminophen (TYLENOL) 500 MG tablet Take 1 tablet (500 mg total) by mouth every 6 (six) hours as needed for mild pain, moderate pain, fever or headache. 08/18/15  Yes Gladstone Lighter, MD  feeding supplement, ENSURE ENLIVE, (ENSURE ENLIVE) LIQD Take 237 mLs by mouth 2 (two) times daily between meals. 07/03/14  Yes Fritzi Mandes, MD  fexofenadine [facility] ALLERGY) 180 MG tablet Take 1 tablet (180 mg total) by mouth daily. 11/18/15  Yes Christien Frankl Alyssa Johnson, DO  albuterol (PROVENTIL HFA;VENTOLIN HFA) 108 (90 Base) MCG/ACT inhaler Inhale 2 puffs into the lungs every 6 (six) hours as needed for wheezing or shortness of breath. 10/12/15   Dwayna Kentner Alyssa Johnson, DO   Allergies  Allergen Reactions  . Aspirin Hives and Other (See Comments)    Nosebleed per patient   Review of Systems  Unable to perform ROS: Mental status change    Physical Exam  Constitutional: She appears cachectic. She is easily aroused. She appears ill.   Cardiovascular: Regular rhythm and normal heart sounds.   Pulmonary/Chest: Accessory muscle usage present. Tachypnea noted. She has rhonchi.  Abdominal: Soft. She exhibits distension. Bowel sounds are decreased.  Musculoskeletal: She exhibits edema (BUE).  Neurological: She is alert and easily aroused. She is disoriented.  Skin:  BLE cool. No mottling.  Psychiatric: Cognition and memory are impaired. She is inattentive.  Nursing note and vitals reviewed.   Vital Signs: BP 131/67 (BP Location: Left Arm)   Pulse (!) 124   Temp 98.4 F (36.9 C)   Resp (!) 22   Ht _0  (1.575 m)   Wt 52.6 kg (116 lb)   LMP  (LMP Unknown)   SpO2 100%   BMI 21.22 kg/m  Pain Assessment: 0-10   Pain Score: Asleep   SpO2: SpO2: 100 % O2 Device:SpO2: 100 % O2 Flow Rate: .  O2 Flow Rate (L/min): 3 L/min  IO: Intake/output summary:   Intake/Output Summary (Last 24 hours) at 12/24/2015 1525 Last data filed at 12/01/2015 1300  Gross per 24 hour  Intake                0 ml  Output              900 ml  Net             -900 ml    LBM: Last BM Date: 12/03/2015 Baseline Weight: Weight: 52.6 kg (116 lb) Most recent weight: Weight: 52.6 kg (116 lb)     Palliative Assessment/Data: PPS 10%   Flowsheet Rows   Flowsheet Row Most Recent Value  Intake Tab  Referral Department  Hospitalist  Unit at Time of Referral  Oncology Unit  Palliative Care Primary Diagnosis  Other (Comment) [failure to thrive-malnutrition/ETOH abuse]  Date Notified  12/06/15  Palliative Care Type  New Palliative care  Reason for referral  Clarify Goals of Care, End of Life Care Assistance, Psychosocial or Spiritual support, Non-pain Symptom  Date of Admission  12/06/15  Date first seen by Palliative Care  12/16/2015  # of days Palliative referral response time  1 Day(s)  # of days IP prior to Palliative referral  0  Clinical Assessment  Palliative Performance Scale Score  10%  Psychosocial & Spiritual Assessment  Palliative Care  Outcomes  Patient/Family meeting held?  Yes  Who was at the meeting?  daughter, son, and friends  Palliative Care Outcomes  Provided end of life care assistance, Counseled regarding hospice, Changed to focus on comfort, Improved pain interventions, Improved non-pain symptom therapy      Time In: 1130 Time Out: 1240 Time Total: 53mn Greater than 50%  of this time was spent counseling and coordinating care related to the above assessment and plan.  Signed by:  MIhor Dow FNP-C Palliative Medicine Team  Phone: [phone]: [phone]  Please contact Palliative Medicine Team phone at [phone] questions and concerns.  For individual provider: See AShea Evans

## 2015-12-07 NOTE — Progress Notes (Signed)
Pt seen and examined.  Seem to be clinically declining. Will call daughter

## 2015-12-07 NOTE — Progress Notes (Signed)
Met with patient's daughter and the rest of the family at bedside.  They are all in agreement for comfort measure and withdrawing care.  We will wait for hospice evaluation and if hospice home bed available and likely transfer her to hospice home today.

## 2015-12-07 NOTE — Progress Notes (Signed)
Tried calling her daughter twice but no luck but based on my discusion with her yesterday, will pursue comfort care and get Hospice involved.  Discussed with care management to request hospice liaison consultation.  She may be eligible for hospice home placement.

## 2015-12-07 NOTE — Progress Notes (Signed)
Alyssa Ortega    MR#:  [id number]  DATE OF BIRTH:  May 03, 1930  SUBJECTIVE:  Significant clinical decline since yesterday, all family at bedside. In agreement for withdrawal of care and hospice if appropriate REVIEW OF SYSTEMS:   Review of Systems  Unable to perform ROS: Critical illness    DRUG ALLERGIES:   Allergies  Allergen Reactions  . Aspirin Hives and Other (See Comments)    Nosebleed per Alyssa Ortega    VITALS:  Blood pressure 131/67, pulse (!) 124, temperature 98.4 F (36.9 C), resp. rate (!) 22, height 5\' 2"  (1.575 m), weight 52.6 kg (116 lb), SpO2 100 %. PHYSICAL EXAMINATION:  Physical Exam  GENERAL:  82 y.o.-year-old Alyssa Ortega lying in the bed - Gasping for air, seem to be actively dying. Thin, weak, frail EYES: Pupils equal, round, minimally reactive to light and accommodation. No scleral icterus. Extraocular muscles intact.  HEENT: Head atraumatic, normocephalic. Oropharynx and nasopharynx clear.  NECK:  Supple, no jugular venous distention. No thyroid enlargement, no tenderness.  LUNGS: Periods of Apnea noted, Gasping for air CARDIOVASCULAR: S1, S2 normal. No murmurs, rubs, or gallops.  ABDOMEN: Soft, Generalized tenderness, distended +. Bowel sounds present. No organomegaly or mass.  EXTREMITIES: No cyanosis, clubbing or edema b/l.    NEUROLOGIC: Cranial nerves II through XII are intact. No focal Motor or sensory deficits b/l.   PSYCHIATRIC:  Alyssa Ortega is unconscious SKIN: No obvious rash, lesion, or ulcer.  LABORATORY PANEL:  CBC  Recent Labs Lab 12/05/2015 2218  WBC 9.6  HGB 13.6  HCT 38.8  PLT 137*    Chemistries   Recent Labs Lab 12/06/15 0502  NA 133*  K 3.8  CL 98*  CO2 26  GLUCOSE 60*  BUN 6  CREATININE 0.52  CALCIUM 8.5*   Cardiac Enzymes  Recent Labs Lab 12/20/2015 0454  TROPONINI 0.03*   RADIOLOGY:  Ct Abdomen Pelvis Wo Contrast  Result Date:  12/06/2015 CLINICAL DATA:  Chest pain, nausea, vomiting for 1 week EXAM: CT ABDOMEN AND PELVIS WITHOUT CONTRAST TECHNIQUE: Multidetector CT imaging of the abdomen and pelvis was performed following the standard protocol without IV contrast. COMPARISON:  None. FINDINGS: Lower chest: Small right pleural effusion. No left pleural effusion. Bibasilar atelectasis. Hepatobiliary: New no focal hepatic mass. Slightly nodular contour of the liver as can be seen with hepatic cellular disease such as cirrhosis. Normal gallbladder. Pancreas: Unremarkable. No pancreatic ductal dilatation or surrounding inflammatory changes. Spleen: Normal in size without focal abnormality. Adrenals/Urinary Tract: Adrenal glands are unremarkable. Kidneys are normal, without renal calculi, focal lesion, or hydronephrosis. Bladder is unremarkable. Stomach/Bowel: Stomach is within normal limits. No evidence of bowel wall thickening, distention, or inflammatory changes. Intraluminal fat density mass in the ascending colon most consistent with a lipoma measuring 2.1 cm. Lipomatosis hypertrophy of the ileocecal valve. Vascular/Lymphatic: Abdominal aortic atherosclerosis. No lymphadenopathy. Reproductive: Status post hysterectomy. No adnexal masses. Other: No fluid collection or hematoma. Musculoskeletal: Bilateral total hip arthroplasties resulting in beam hardening artifact partially obscuring the lower pelvis. No acute osseous abnormality. Chronic L2, L3, L4 and L5 vertebral body compression fractures with methylmethacrylate within the vertebral bodies. Epidural extension of methylmethacrylate along the right paracentral aspect of the L2 vertebral body narrowing the right lateral recess. IMPRESSION: 1. No acute abdominal or pelvic pathology.  No bowel obstruction. 2. Intraluminal fat density mass in the ascending colon most consistent with a lipoma measuring 2.1 cm. 3. Small right pleural  effusion. Electronically Signed   By: Kathreen Devoid   On:  12/06/2015 12:26   Dg Chest Port 1 View  Result Date: 11/29/2015 CLINICAL DATA:  Sudden onset shortness of breath tonight. EXAM: PORTABLE CHEST 1 VIEW COMPARISON:  Chest radiographs 12/17/2015, included lung bases from CT abdomen/pelvis 12/06/2015 FINDINGS: Right pleural effusion, increased from prior chest radiograph. Mild vascular congestion. Unchanged mediastinal contours and heart size. Streaky left basilar scarring. No pneumothorax. Alyssa Ortega is rotated. IMPRESSION: Developing right pleural effusion and vascular congestion. Left basilar scarring. Electronically Signed   By: Jeb Levering M.D.   On: 12/11/2015 03:49   Dg Abd Portable 1v  Result Date: 12/06/2015 CLINICAL DATA:  Pt admitted for hyponatremia. Pt with complaints of abdominal pain and has been vomiting; Pt with history of alcohol abuse drinks 2 beers daily, HTN, and gastritis EXAM: PORTABLE ABDOMEN - 1 VIEW COMPARISON:  CT, 08/17/2015 FINDINGS: Normal bowel gas pattern.  No evidence of obstruction. No convincing renal or ureteral stones. There are atherosclerotic calcifications noted along a tortuous abdominal aorta. Bones are demineralized. There are bilateral hip arthroplasties and vertebroplasty has been performed at lower lumbar levels. IMPRESSION: 1. No acute findings.  No evidence bowel obstruction. Electronically Signed   By: Lajean Manes M.D.   On: 12/06/2015 08:21   ASSESSMENT AND PLAN:  82 year old female admitted for weakness and hyponatremia.  1. Weakness: Secondary to deconditioning as well as hyponatremia.  - Comfort Care now  2. Vomiting with epigastric pain suggest gastritis - Comfort Care now  3. Alcohol abuse: - Comfort Care now  4. Elevated troponin: secondary to demand ischemia muscle inflammation.  - Comfort Care now  5.  Failure to thrive - Chronic severe protein calorie malnutrition - As per Alyssa Ortega's daughter, Alyssa Ortega weighed 179 pounds about 2 years ago.  About 3 months ago Alyssa Ortega was 130 pounds and now  Alyssa Ortega weighs 116 pounds - UnIntentional weight loss - Poor appetite - As per her primary care physician's note Alyssa Ortega has had numerous ER visits often for fall - Her daughter is her power of attorney and Alyssa Ortega is in agreement with - [place], Alyssa Ortega is actively dying.  He will would not survive transportation to hospice home at this point.      Alyssa Ortega is actively dying here and would not survive transportation to hospice home/ going back home  Case discussed with Care Management/Social Worker. Management plans discussed with the Alyssa Ortega, nursing family (discussed with Daughter Sharol Given on the phone at [phone] and at bedside) and they are in agreement.  CODE STATUS: DO NOT RESUSCITATE, Comfort Care now.  Nurse may pronounce   TOTAL TIME TAKING CARE OF THIS Alyssa Ortega:30 minutes.  >50% time spent on counselling and coordination of care   Note: This dictation was prepared with Dragon dictation along with smaller phrase technology. Any transcriptional errors that result from this process are unintentional.  [facility] Inger Wiest M.D on 11/30/2015 at 1:42 PM  Between 7am to 6pm - Pager - [phone]  After 6pm go to [email or web address] - password [email or web address] [facility]  Office  [phone]  CC: Primary care physician; Park Liter, DO

## 2015-12-07 NOTE — Progress Notes (Addendum)
Patient has now transitioned to comfort care. We have moved patient to room 106, family at bedside agreeable to move.   Non-rebreather mask removed per comfort care order. Nasal cannula applied for respiratory comfort, will try to wean through the afternoon.

## 2015-12-07 NOTE — Progress Notes (Signed)
Chaplain received order for end of life from nurse and visited the patient, who at the time of this visit was surrounded by her daughter and friends. Daughter was overwhelmed by the health state of the mother and had been told that the mother didn't have much time to live, and would be transferred to a hospice as soon as a bed is found. Chaplain provided emotion support for family and friends and also offered prayers at the daughters request, which they said was appreciated. Chaplain promise to visit them again later if necessary.    12/02/2015 1200  Clinical Encounter Type  Visited With Patient;Patient and family together  Visit Type Initial;Follow-up;Spiritual support  Referral From Nurse  Spiritual Encounters  Spiritual Needs Prayer;Emotional

## 2015-12-07 NOTE — Progress Notes (Addendum)
Dr. Manuella Ghazi notified of early morning events: -3:30am (O2 sats dropping, frothing from the mouth, confused/agitated, now on non-rebreather, IVF's stopped, lasix given).   Patient VS currently stable, remains on non-rebreather at 100% but sounds congested. Patient color is pale and left eye non-reactive, directed to the left side. Patient able to weakly squeeze my hand when asked. I asked Dr. Manuella Ghazi to come see patient as soon as possible this morning.   Will continue to closely monitor.

## 2015-12-08 MED ORDER — MORPHINE SULFATE (CONCENTRATE) 10 MG/0.5ML PO SOLN
5.0000 mg | Freq: Four times a day (QID) | ORAL | Status: DC
  Administered 2015-12-08 – 2015-12-09 (×4): 5 mg via ORAL
  Filled 2015-12-08 (×4): qty 1

## 2015-12-08 NOTE — Progress Notes (Signed)
New hospice home referral received from [place]. Hospital care team and family aware of no bed availability. Patient information faxed to [place] referral. Will follow up with family and care team in the morning. Thank you. Flo Shanks RN, BSN, [place] and [facility] [place], [facility] [phone] c

## 2015-12-08 NOTE — Progress Notes (Signed)
[facility] [place] NAME: Alyssa Ortega    MR#:  [id number]  DATE OF BIRTH:  August 04, 1930  SUBJECTIVE:  More awake today REVIEW OF SYSTEMS:   Review of Systems  Unable to perform ROS: Critical illness   DRUG ALLERGIES:   Allergies  Allergen Reactions  . Aspirin Hives and Other (See Comments)    Nosebleed per patient    VITALS:  Blood pressure (!) 87/53, pulse (!) 117, temperature 98.2 F (36.8 C), temperature source Oral, resp. rate (!) 22, height 5\' 2"  (1.575 m), weight 52.6 kg (116 lb), SpO2 100 %. PHYSICAL EXAMINATION:  Physical Exam  GENERAL:  82 y.o.-year-old patient lying in the bed - Gasping for air, seem to be actively dying. Thin, weak, frail EYES: Pupils equal, round, minimally reactive to light and accommodation. No scleral icterus. Extraocular muscles intact.  HEENT: Head atraumatic, normocephalic. Oropharynx and nasopharynx clear.  NECK:  Supple, no jugular venous distention. No thyroid enlargement, no tenderness.  LUNGS: Periods of Apnea noted, Gasping for air CARDIOVASCULAR: S1, S2 normal. No murmurs, rubs, or gallops.  ABDOMEN: Soft, Generalized tenderness, distended +. Bowel sounds present. No organomegaly or mass.  EXTREMITIES: No cyanosis, clubbing or edema b/l.    NEUROLOGIC: Cranial nerves II through XII are intact. No focal Motor or sensory deficits b/l.   PSYCHIATRIC:  patient is more awake today SKIN: No obvious rash, lesion, or ulcer.  LABORATORY PANEL:  CBC  Recent Labs Lab 11/30/2015 2218  WBC 9.6  HGB 13.6  HCT 38.8  PLT 137*    Chemistries   Recent Labs Lab 12/06/15 0502  NA 133*  K 3.8  CL 98*  CO2 26  GLUCOSE 60*  BUN 6  CREATININE 0.52  CALCIUM 8.5*   Cardiac Enzymes  Recent Labs Lab 12/01/2015 0454  TROPONINI 0.03*   RADIOLOGY:  Dg Chest Port 1 View  Result Date: 12/04/2015 CLINICAL DATA:  Sudden onset shortness of breath tonight. EXAM: PORTABLE CHEST 1 VIEW  COMPARISON:  Chest radiographs 12/01/2015, included lung bases from CT abdomen/pelvis 12/06/2015 FINDINGS: Right pleural effusion, increased from prior chest radiograph. Mild vascular congestion. Unchanged mediastinal contours and heart size. Streaky left basilar scarring. No pneumothorax. Patient is rotated. IMPRESSION: Developing right pleural effusion and vascular congestion. Left basilar scarring. Electronically Signed   By: Jeb Levering M.D.   On: 12/04/2015 03:49   ASSESSMENT AND PLAN:  82 year old female admitted for weakness and hyponatremia.  1. Weakness: Secondary to deconditioning as well as hyponatremia.  - Comfort Care now  2. Vomiting with epigastric pain suggest gastritis - Comfort Care now  3. Alcohol abuse: - Comfort Care now  4. Elevated troponin: secondary to demand ischemia muscle inflammation.  - Comfort Care now  5.  Failure to thrive - Chronic severe protein calorie malnutrition - As per patient's daughter, she weighed 179 pounds about 2 years ago.  About 3 months ago she was 130 pounds and now she weighs 116 pounds - UnIntentional weight loss - Poor appetite - As per her primary care physician's note patient has had numerous ER visits often for fall - Her daughter is her power of attorney and she is in agreement with - [place], she is actively dying.  He will would not survive transportation to hospice home at this point.      She seems more awake today, may need transfer to [place] [facility] if bed available.  Case discussed with Care Management/Social Worker. Management plans  discussed with the patient, nursing family (discussed with Daughter Sharol Given on the phone at [phone] and at bedside) and they are in agreement.  CODE STATUS: DO NOT RESUSCITATE, Comfort Care now.  Nurse may pronounce   TOTAL TIME TAKING CARE OF THIS PATIENT:30 minutes.  >50% time spent on counselling and coordination of care   Note: This dictation was prepared with  Dragon dictation along with smaller phrase technology. Any transcriptional errors that result from this process are unintentional.  [facility], Elyn Krogh M.D on 12/08/2015 at 1:09 PM  Between 7am to 6pm - Pager - [phone]  After 6pm go to [email or web address] - password [email or web address] [facility] [facility]  Office  [phone]  CC: Primary care physician; Park Liter, DO

## 2015-12-08 NOTE — Progress Notes (Signed)
While rounding [place] made initial visit with Pt. Pt was in good spirits and family was bedside. [place] provided the ministry of presence and prayer. Pt may be transitioned to Hospice.  [facility] is available for follow up as needed.    12/08/15 1500  Clinical Encounter Type  Visited With Patient;Family;Patient and family together;Health care provider  Visit Type Initial;Spiritual support  Referral From Nurse  Spiritual Encounters  Spiritual Needs Prayer;Emotional

## 2015-12-08 NOTE — Care Management Important Message (Signed)
Important Message  Patient Details  Name: Alyssa Ortega MRN: [id number] Date of Birth: Jan 27, 1931   Medicare Important Message Given:  Yes    Shelbie Ammons, RN 12/08/2015, 9:24 AM

## 2015-12-08 NOTE — Progress Notes (Signed)
Daily Progress Note   Patient Name: Alyssa Ortega       Date: 12/08/2015 DOB: [birth year]  Age: 82 y.o. MRN#: [id number] Attending Physician: Max Sane, MD Primary Care Physician: Park Liter, DO Admit Date: 12/01/2015  Reason for Consultation/Follow-up: Establishing goals of care and Hospice Evaluation  Subjective: Patient awake and alert this morning. Grimaces when legs are assessed. Nasal cannula removed. Friend at bedside. Discussed with her that she is stable enough to be transferred to residential hospice facility.   Length of Stay: 1  Current Medications: Scheduled Meds:  . morphine CONCENTRATE  5 mg Oral Q6H   PRN Meds: [DISCONTINUED] acetaminophen **OR** acetaminophen, glycopyrrolate, LORazepam, morphine injection, ondansetron **OR** ondansetron (ZOFRAN) IV  Physical Exam  Constitutional: She appears cachectic. She is cooperative. She appears ill.  Cardiovascular: Regular rhythm and normal heart sounds.   Pulmonary/Chest: Tachypnea noted. She has rhonchi.  Abdominal: Soft. Bowel sounds are normal. She exhibits distension.  Neurological: She is alert. She is disoriented.  Skin: Skin is warm and dry.  BLE cool. No mottling.  Psychiatric: Her speech is delayed. Cognition and memory are impaired.  Nursing note and vitals reviewed.           Vital Signs: BP (!) 87/53 (BP Location: Left Arm)   Pulse (!) 117   Temp 98.2 F (36.8 C) (Oral)   Resp (!) 22   Ht 5\' 2"  (1.575 m)   Wt 52.6 kg (116 lb)   LMP  (LMP Unknown)   SpO2 100%   BMI 21.22 kg/m  SpO2: SpO2: 100 % O2 Device: O2 Device: Nasal Cannula O2 Flow Rate: O2 Flow Rate (L/min): 5 L/min  Intake/output summary:  Intake/Output Summary (Last 24 hours) at 12/08/15 0938 Last data filed at 12/08/15 0900  Gross  per 24 hour  Intake                5 ml  Output             1300 ml  Net            -1295 ml   LBM: Last BM Date: 12/03/2015 Baseline Weight: Weight: 52.6 kg (116 lb) Most recent weight: Weight: 52.6 kg (116 lb)       Palliative Assessment/Data: PPS 10%   Flowsheet Rows   Flowsheet Row  Most Recent Value  Intake Tab  Referral Department  Hospitalist  Unit at Time of Referral  Oncology Unit  Palliative Care Primary Diagnosis  Other (Comment) [failure to thrive-malnutrition/ETOH abuse]  Date Notified  12/06/15  Palliative Care Type  New Palliative care  Reason for referral  Clarify Goals of Care, End of Life Care Assistance, Psychosocial or Spiritual support, Non-pain Symptom  Date of Admission  12/06/15  Date first seen by Palliative Care  12/20/2015  # of days Palliative referral response time  1 Day(s)  # of days IP prior to Palliative referral  0  Clinical Assessment  Palliative Performance Scale Score  10%  Psychosocial & Spiritual Assessment  Palliative Care Outcomes  Patient/Family meeting held?  Yes  Who was at the meeting?  daughter, son, and friends  Palliative Care Outcomes  Provided end of life care assistance, Counseled regarding hospice, Changed to focus on comfort, Improved pain interventions, Improved non-pain symptom therapy      Patient Active Problem List   Diagnosis Date Noted  . Respiratory distress 12/08/2015  . Palliative care encounter   . Comfort measures only status   . Dyspnea   . Pain, generalized   . Malnutrition of moderate degree 12/06/2015  . Intractable vomiting with nausea   . Chest pain   . Hyponatremia 08/17/2015  . Compression fracture of lumbar spine, non-traumatic ([place]) 05/18/2015  . Adult failure to thrive 05/18/2015  . COPD (chronic obstructive pulmonary disease) ([place]) 04/15/2015  . Malnutrition ([place]) 03/18/2015  . Trochanteric bursitis of right hip 10/20/2014  . Benign essential HTN 08/05/2014  . Hypercholesterolemia without  hypertriglyceridemia 08/05/2014  . Cellulitis and abscess of leg 07/01/2014  . Wedge fracture of lumbar vertebra ([place]) 09/30/2013  . Screening for breast cancer 12/22/2010  . Alcohol abuse, episodic 12/22/2010  . Weight loss, non-intentional 12/22/2010  . Tinnitus of left ear 12/22/2010  . Thrombocytopenia ([place]) 12/22/2010  . Osteoporosis with fracture   . Rib fractures   . History of epistaxis   . GI bleed 10/29/2007    Palliative Care Assessment & Plan   Patient Profile: 82 y.o. female  with past medical history of ETOH abuse, hypertension, hyperlipidemia, osteoporosis, hip fractures, rib fractures, and gastrointestinal bleed admitted on 12/03/2015 with chest pain. Significant hyponatremia and elevated troponin on admission. Vomiting with epigastric pain suggesting gastritis. Failure to thrive with severe protein calorie malnutrition. Per daughter, she has lost over 60 lbs unintentionally in the last two years and has a poor appetite. Significant decline during hospitalization requiring non-rebreather mask. Family has been at bedside and in agreement with withdrawal of care and comfort measures only.    Assessment: Weakness Hyponatremia ETOH abuse Falure to thrive Severe protein calorie malnutrition  Recommendations/Plan:  DNR/DNI  BLE pain upon physical examination. Scheduled Morphine (roxanol) 5mg  PO q6h pain/dyspnea.  May give Morphine 1mg  IV q1h prn pain/dyspnea  Robinul 0.2mg  IV q4h prn secretions  Ativan 1mg  IV q4h prn anxiety/agitation  Patient is stable for transfer to residential hospice. Discussed with family. Social work consulted.  Goals of Care and Additional Recommendations:  Limitations on Scope of Treatment: Full Comfort Care  Code Status: DNR   Code Status Orders        Start     Ordered   12/18/2015 0847  DNR (Do not attempt resuscitation)  Continuous    Question Answer Comment  In the event of cardiac or respiratory ARREST Do not call a "code  blue"   In the event of  cardiac or respiratory ARREST Do not perform Intubation, CPR, defibrillation or ACLS   In the event of cardiac or respiratory ARREST Use medication by any route, position, wound care, and other measures to relive pain and suffering. May use oxygen, suction and manual treatment of airway obstruction as needed for comfort.      12/09/2015 0846    Code Status History    Date Active Date Inactive Code Status Order ID Comments User Context   12/06/2015 10:28 AM 12/24/2015  8:46 AM DNR [id number]  Max Sane, MD Inpatient   12/05/2015  2:42 AM 12/06/2015 10:28 AM Full Code [id number]  Harrie Foreman, MD Inpatient   08/17/2015  8:29 AM 08/18/2015  9:59 PM Full Code [id number]  Baxter Hire, MD Inpatient   07/01/2014  4:41 PM 07/03/2014  5:59 PM Full Code [id number]  Nicholes Mango, MD Inpatient    Advance Directive Documentation   Flowsheet Row Most Recent Value  Type of Advance Directive  Healthcare Power of Attorney  Pre-existing out of facility DNR order (yellow form or pink MOST form)  No data  "MOST" Form in Place?  No data       Prognosis:   Hours - Days failure to thrive, severe nutritional and functional status decline  Discharge Planning:  Hospice facility  Care plan was discussed with patient, family, social worker, and RN  Thank you for allowing the Palliative Medicine Team to assist in the care of this patient.   Time In: 0845 Time Out: 0900 Total Time 54min Prolonged Time Billed  no       Greater than 50%  of this time was spent counseling and coordinating care related to the above assessment and plan.  Ihor Dow, FNP-C Palliative Medicine Team  Phone: [phone] Fax: [phone]  Please contact Palliative Medicine Team phone at [phone] for questions and concerns.

## 2015-12-08 NOTE — Clinical Social Work Note (Signed)
Clinical Social Work Assessment  Patient Details  Name: Alyssa Ortega MRN: [id number] Date of Birth: 05/28/1930  Date of referral:  12/08/15               Reason for consult:  Discharge Planning                Permission sought to share information with:  Family Supports Permission granted to share information::     Name::        Agency::     Relationship::   (Daughter)  Contact Information:     Housing/Transportation Living arrangements for the past 2 months:  Single Family Home Source of Information:  Adult Children Patient Interpreter Needed:  None Criminal Activity/Legal Involvement Pertinent to Current Situation/Hospitalization:  No - Comment as needed Significant Relationships:  Adult Children, Other Family Members, Friend Lives with:  Self Do you feel safe going back to the place where you live?  No Need for family participation in patient care:  Yes (Comment) (Daughter)  Care giving concerns:  Residential Hospice Placement   Social Worker assessment / plan:  CSW was informed by Palliative Care NP that patient's family is interested in [facility] and Residential Hospice Placement. CSW met at bedside with patient and her daughter. Introduced herself and her role. Per patient's daughter she is interested in patient going to [facility]. Stated that she would feel more comfortable with patient going to that facility vs home. Granted CSW verbal permission to send referral to [facility]. CSW made referral to [facility]. CSW will continue to follow and assist.   Employment status:  Retired Nurse, adult PT Recommendations:  [place] / Referral to community resources:  [place] (Residential Hospice )  Patient/Family's Response to care:  Patient's daughter in agreement for patient to discharge to [facility] home.   Patient/Family's Understanding of and Emotional  Response to Diagnosis, Current Treatment, and Prognosis:  Daughter reports she understands. Thanked CSW for assistance.   Emotional Assessment Appearance:  Appears older than stated age Attitude/Demeanor/Rapport:   (None) Affect (typically observed):  Pleasant Orientation:  Oriented to Self Alcohol / Substance use:  Not Applicable Psych involvement (Current and /or in the community):  No (Comment)  Discharge Needs  Concerns to be addressed:  Discharge Planning Concerns Readmission within the last 30 days:  No Current discharge risk:  Chronically ill Barriers to Discharge:  Continued Medical Work up   [employer], LCSW 12/08/2015, 11:43 AM

## 2015-12-09 ENCOUNTER — Ambulatory Visit: Payer: Commercial Managed Care - HMO | Admitting: Family Medicine

## 2015-12-09 MED ORDER — MORPHINE SULFATE (PF) 2 MG/ML IV SOLN
2.0000 mg | INTRAVENOUS | Status: DC
  Administered 2015-12-09 – 2015-12-10 (×5): 2 mg via INTRAVENOUS
  Filled 2015-12-09 (×5): qty 1

## 2015-12-09 NOTE — Progress Notes (Signed)
Per discussion with Palliative Medicine NP Ihor Dow patient is now too unstable to transfer to the hospice home and will remain at [facility] with hospital death expected. Support offered to family, daughter [name] and friend [name] at bedside. Hospice referral made aware. Thank you. Flo Shanks RN, BSN, [place] and [facility] [place], [facility] [phone] c

## 2015-12-09 NOTE — Progress Notes (Signed)
CSW was informed that patient is too unstable to transfer at this time. CSW will continue to follow and assist.  Ernest Pine, MSW, LCSW, [place] Social Worker [phone]

## 2015-12-09 NOTE — Consult Note (Signed)
   [facility] Inpatient Consult   [id number]  Alyssa Ortega [id number]   Patient is currently active with [facility] for chronic disease management services.  Chart reveals patient has been referred to Hospice while inpatient and will be accepting their services. This liaison will inform patient's [facility] care manager and [facility] CSW so they can discontinue services. For additional questions or referrals please contact:  Davari Lopes RN, [facility] Liaison  [phone]) [place] [phone]) Toll free office

## 2015-12-09 NOTE — Progress Notes (Signed)
[facility] [place] NAME: Alyssa Ortega    MR#:  [id number]  DATE OF BIRTH:  1930-06-17  SUBJECTIVE:  Patient comfortable made comfort care multiple family members at bedside   REVIEW OF SYSTEMS:   Review of Systems  Unable to perform ROS: Critical illness   DRUG ALLERGIES:   Allergies  Allergen Reactions  . Aspirin Hives and Other (See Comments)    Nosebleed per patient    VITALS:  Blood pressure (!) 94/52, pulse (!) 119, temperature 98.6 F (37 C), temperature source Oral, resp. rate (!) 34, height 5\' 2"  (1.575 m), weight 116 lb (52.6 kg), SpO2 (!) 67 %. PHYSICAL EXAMINATION:  Physical Exam  GENERAL:  82 y.o.-year-old patient lying in the bed - Gasping for air, seem to be actively dying. Thin, weak, frail EYES: Pupils equal, round, minimally reactive to light and accommodation. No scleral icterus. Extraocular muscles intact.  HEENT: Head atraumatic, normocephalic. Oropharynx and nasopharynx clear.  NECK:  Supple, no jugular venous distention. No thyroid enlargement, no tenderness.  LUNGS: Periods of Apnea noted, Gasping for air CARDIOVASCULAR: S1, S2 normal. No murmurs, rubs, or gallops.  ABDOMEN: Soft, Generalized tenderness, distended +. Bowel sounds present. No organomegaly or mass.  EXTREMITIES: No cyanosis, clubbing or edema b/l.    NEUROLOGIC: Cranial nerves II through XII are intact. No focal Motor or sensory deficits b/l.   PSYCHIATRIC:  patient is more awake today SKIN: No obvious rash, lesion, or ulcer.  LABORATORY PANEL:  CBC  Recent Labs Lab 12/13/2015 2218  WBC 9.6  HGB 13.6  HCT 38.8  PLT 137*    Chemistries   Recent Labs Lab 12/06/15 0502  NA 133*  K 3.8  CL 98*  CO2 26  GLUCOSE 60*  BUN 6  CREATININE 0.52  CALCIUM 8.5*   Cardiac Enzymes  Recent Labs Lab 12/11/2015 0454  TROPONINI 0.03*   RADIOLOGY:  No results found. ASSESSMENT AND PLAN:  82 year old female admitted for weakness  and hyponatremia.  1. Weakness: Secondary to deconditioning as well as hyponatremia.  -Continue comfort care supportive care  2. Vomiting with epigastric pain suggest gastritis - Comfort Care now  3. Alcohol abuse: - Comfort Care now  4. Elevated troponin: secondary to demand ischemia muscle inflammation.  - Comfort Care now  5.  Failure to thrive - Chronic severe protein calorie malnutrition   She seems more awake today, may need transfer to [facility] if bed available.  Case discussed with Care Management/Social Worker. Management plans discussed with the patient, nursing family (discussed with Daughter Sharol Given on the phone at [phone] and at bedside) and they are in agreement.  CODE STATUS: DO NOT RESUSCITATE, Comfort Care now.  Nurse may pronounce   TOTAL TIME TAKING CARE OF THIS PATIENT:30 minutes.     Note: This dictation was prepared with Dragon dictation along with smaller phrase technology. Any transcriptional errors that result from this process are unintentional.  Dustin Flock M.D on 12/09/2015 at 12:01 PM  Between 7am to 6pm - Pager - [phone]  After 6pm go to [email or web address] - password [email or web address] [facility]  Office  [phone]  CC: Primary care physician; Park Liter, DO

## 2015-12-09 NOTE — Progress Notes (Signed)
While rounding, Hospice worker informed me that the Pt had declined significantly over night and the family decided not to take her to [facility]. Family was bedside with Pt. We spent quality time remembering stories of Pt which helped in their grieving process. CH provided the ministry of prayer, empathetic listening, and presence. The family was appreciative of the care given their mother and of the visits of all the [facility].    12/09/15 1200  Clinical Encounter Type  Visited With Patient;Patient and family together;Health care provider  Visit Type Follow-up;Spiritual support;Patient actively dying  Referral From Nurse  Spiritual Encounters  Spiritual Needs Prayer;Emotional;Grief support

## 2015-12-09 NOTE — Progress Notes (Signed)
Daily Progress Note   Patient Name: Alyssa Ortega       Date: 12/09/2015 DOB: 04/03/1930  Age: 82 y.o. MRN#: [id number] Attending Physician: Dustin Flock, MD Primary Care Physician: Park Liter, DO Admit Date: 12/09/2015  Reason for Consultation/Follow-up: Hospice Evaluation  Subjective: Patient lethargic with occasional moans. Appears distressed and uncomfortable with furrowed eyebrows. Family at bedside stating she was uncomfortable through the night saying "help me." Discussed with family that she has changed since yesterday and getting closer to the end of her life. Also explained that at this point, unstable for transfer to residential hospice facility.   Length of Stay: 2  Current Medications: Scheduled Meds:  .  morphine injection  2 mg Intravenous Q4H   PRN Meds: [DISCONTINUED] acetaminophen **OR** acetaminophen, glycopyrrolate, LORazepam, morphine injection, ondansetron **OR** ondansetron (ZOFRAN) IV  Physical Exam  Constitutional: She appears lethargic. She appears cachectic. She appears ill. She appears distressed.  Cardiovascular: Regular rhythm and normal heart sounds.   Pulmonary/Chest: Tachypnea noted. She has rhonchi.  Abdominal: Soft. Bowel sounds are normal. She exhibits distension.  Neurological: She appears lethargic. She is disoriented.  Skin: Skin is warm and dry.  BLE cool. No mottling.  Psychiatric: Cognition and memory are impaired. She is noncommunicative. She is inattentive.  Nursing note and vitals reviewed.           Vital Signs: BP (!) 94/52 (BP Location: Right Arm)   Pulse (!) 119   Temp 98.6 F (37 C) (Oral)   Resp (!) 34   Ht 5\' 2"  (1.575 m)   Wt 52.6 kg (116 lb)   LMP  (LMP Unknown)   SpO2 (!) 67%   BMI 21.22 kg/m  SpO2: SpO2: (!) 67  % O2 Device: O2 Device: Nasal Cannula O2 Flow Rate: O2 Flow Rate (L/min): 3 L/min  Intake/output summary:   Intake/Output Summary (Last 24 hours) at 12/09/15 0846 Last data filed at 12/09/15 0514  Gross per 24 hour  Intake              241 ml  Output              300 ml  Net              -59 ml   LBM: Last BM Date: 12/14/2015 Baseline Weight: Weight: 52.6  kg (116 lb) Most recent weight: Weight: 52.6 kg (116 lb)       Palliative Assessment/Data: PPS 10%   Flowsheet Rows   Flowsheet Row Most Recent Value  Intake Tab  Referral Department  Hospitalist  Unit at Time of Referral  Oncology Unit  Palliative Care Primary Diagnosis  Other (Comment) [failure to thrive-malnutrition/ETOH abuse]  Date Notified  12/06/15  Palliative Care Type  New Palliative care  Reason for referral  Clarify Goals of Care, End of Life Care Assistance, Psychosocial or Spiritual support, Non-pain Symptom  Date of Admission  12/06/15  Date first seen by Palliative Care  12/14/2015  # of days Palliative referral response time  1 Day(s)  # of days IP prior to Palliative referral  0  Clinical Assessment  Palliative Performance Scale Score  10%  Psychosocial & Spiritual Assessment  Palliative Care Outcomes  Patient/Family meeting held?  Yes  Who was at the meeting?  daughter, son, and friends  Palliative Care Outcomes  Provided end of life care assistance, Counseled regarding hospice, Changed to focus on comfort, Improved pain interventions, Improved non-pain symptom therapy      Patient Active Problem List   Diagnosis Date Noted  . Respiratory distress 12/25/2015  . Palliative care encounter   . Comfort measures only status   . Dyspnea   . Pain, generalized   . Malnutrition of moderate degree 12/06/2015  . Intractable vomiting with nausea   . Chest pain   . Hyponatremia 08/17/2015  . Compression fracture of lumbar spine, non-traumatic ([place]) 05/18/2015  . Adult failure to thrive 05/18/2015  . COPD  (chronic obstructive pulmonary disease) ([place]) 04/15/2015  . Malnutrition ([place]) 03/18/2015  . Trochanteric bursitis of right hip 10/20/2014  . Benign essential HTN 08/05/2014  . Hypercholesterolemia without hypertriglyceridemia 08/05/2014  . Cellulitis and abscess of leg 07/01/2014  . Wedge fracture of lumbar vertebra ([place]) 09/30/2013  . Screening for breast cancer 12/22/2010  . Alcohol abuse, episodic 12/22/2010  . Weight loss, non-intentional 12/22/2010  . Tinnitus of left ear 12/22/2010  . Thrombocytopenia ([place]) 12/22/2010  . Osteoporosis with fracture   . Rib fractures   . History of epistaxis   . GI bleed 10/29/2007    Palliative Care Assessment & Plan   Patient Profile: 82 y.o. female  with past medical history of ETOH abuse, hypertension, hyperlipidemia, osteoporosis, hip fractures, rib fractures, and gastrointestinal bleed admitted on 12/27/2015 with chest pain. Significant hyponatremia and elevated troponin on admission. Vomiting with epigastric pain suggesting gastritis. Failure to thrive with severe protein calorie malnutrition. Per daughter, she has lost over 60 lbs unintentionally in the last two years and has a poor appetite. Significant decline during hospitalization requiring non-rebreather mask. Family has been at bedside and in agreement with withdrawal of care and comfort measures only.    Assessment: Weakness Hyponatremia ETOH abuse Falure to thrive Severe protein calorie malnutrition  Recommendations/Plan:  DNR/DNI  Scheduled Morphine 2mg  IV q4h pain/dyspnea.  May give Morphine 1mg  IV q1h prn pain/dyspnea  Robinul 0.2mg  IV q4h prn secretions  Ativan 1mg  IV q4h prn anxiety/agitation  Patient has changed from yesterday. Unstable for transfer. Anticipate hospital death.   Goals of Care and Additional Recommendations:  Limitations on Scope of Treatment: Full Comfort Care  Code Status: DNR   Code Status Orders        Start     Ordered   11/29/2015  0847  DNR (Do not attempt resuscitation)  Continuous    Question Answer Comment  In the event of cardiac or respiratory ARREST Do not call a "code blue"   In the event of cardiac or respiratory ARREST Do not perform Intubation, CPR, defibrillation or ACLS   In the event of cardiac or respiratory ARREST Use medication by any route, position, wound care, and other measures to relive pain and suffering. May use oxygen, suction and manual treatment of airway obstruction as needed for comfort.      12/03/2015 0846    Code Status History    Date Active Date Inactive Code Status Order ID Comments User Context   12/06/2015 10:28 AM 12/15/2015  8:46 AM DNR [id number]  Max Sane, MD Inpatient   12/05/2015  2:42 AM 12/06/2015 10:28 AM Full Code [id number]  Harrie Foreman, MD Inpatient   08/17/2015  8:29 AM 08/18/2015  9:59 PM Full Code [id number]  Baxter Hire, MD Inpatient   07/01/2014  4:41 PM 07/03/2014  5:59 PM Full Code [id number]  Nicholes Mango, MD Inpatient    Advance Directive Documentation   Flowsheet Row Most Recent Value  Type of Advance Directive  Healthcare Power of Attorney  Pre-existing out of facility DNR order (yellow form or pink MOST form)  No data  "MOST" Form in Place?  No data       Prognosis:   Hours - Days failure to thrive, severe nutritional and functional status decline  Discharge Planning:  Anticipated Hospital Death  Care plan was discussed with patient, family, RN, and Dr. Posey Pronto  Thank you for allowing the Palliative Medicine Team to assist in the care of this patient.   Time In: 0830 Time Out: 0855 Total Time 9min Prolonged Time Billed  no       Greater than 50%  of this time was spent counseling and coordinating care related to the above assessment and plan.  Ihor Dow, FNP-C Palliative Medicine Team  Phone: [phone] Fax: [phone]  Please contact Palliative Medicine Team phone at [phone] for questions and concerns.

## 2015-12-10 ENCOUNTER — Encounter: Payer: Self-pay | Admitting: *Deleted

## 2015-12-10 ENCOUNTER — Other Ambulatory Visit: Payer: Self-pay | Admitting: *Deleted

## 2015-12-13 ENCOUNTER — Other Ambulatory Visit: Payer: Self-pay | Admitting: *Deleted

## 2015-12-13 NOTE — Patient Outreach (Signed)
[facility]) Care Management  0000000  HARRY SCHERER [id number]   Patient closed to community case management-social work. Patient deceased.   Sheralyn Boatman [facility] Care Management [phone]

## 2015-12-21 ENCOUNTER — Other Ambulatory Visit: Payer: Self-pay | Admitting: Pharmacist

## 2015-12-21 NOTE — Patient Outreach (Signed)
[facility]) Care Management  [id number]  Alyssa Ortega [id number]   Patient closed to [facility] pharmacy services.  Patient deceased.  Bennye Alm, PharmD [facility] PGY2 Pharmacy Resident [phone]

## 2015-12-23 ENCOUNTER — Ambulatory Visit: Payer: Medicare HMO | Admitting: *Deleted

## 2015-12-23 NOTE — Patient Outreach (Signed)
Bennye Alm PharmD is closing case with Pharmacy due to patient is deceased.

## 2015-12-29 NOTE — Discharge Summary (Signed)
HAI NARDINI, 82 y.o., DOB April 01, 1930, MRN [id number]. Admission date: 12/21/2015 Discharge Date Jan 03, 2016 Primary MD Park Liter, DO Admitting Physician Harrie Foreman, MD  Admission Diagnosis  Hyponatremia [E87.1] Weakness [R53.1] Chest pain, unspecified type [R07.9]  Discharge Diagnosis   Active Problems:   Hyponatremia   Malnutrition of moderate degree   Intractable vomiting with nausea   Chest pain   Respiratory distress   Palliative care encounter   Comfort measures only status   Dyspnea   Pain, generalized         Hospital Course  patient with medical history of alcohol abuse presented to the ED complaining of chest pain. She wants severely malnourished. She also had intractable nausea vomiting. Patient was admitted for further evaluation and therapy. She has had a history of recurrent ER visits for same. Patient had progression decline over long period of time therefore palliative care consult was obtained and patient was made comfort care with only supportive care. Patient passed away earlier this morning family was there at the bedside.             Consults  None  Significant Tests:  See full reports for all details     Ct Abdomen Pelvis Wo Contrast  Result Date: 12/06/2015 CLINICAL DATA:  Chest pain, nausea, vomiting for 1 week EXAM: CT ABDOMEN AND PELVIS WITHOUT CONTRAST TECHNIQUE: Multidetector CT imaging of the abdomen and pelvis was performed following the standard protocol without IV contrast. COMPARISON:  None. FINDINGS: Lower chest: Small right pleural effusion. No left pleural effusion. Bibasilar atelectasis. Hepatobiliary: New no focal hepatic mass. Slightly nodular contour of the liver as can be seen with hepatic cellular disease such as cirrhosis. Normal gallbladder. Pancreas: Unremarkable. No pancreatic ductal dilatation or surrounding inflammatory changes. Spleen: Normal in size without focal abnormality. Adrenals/Urinary Tract: Adrenal  glands are unremarkable. Kidneys are normal, without renal calculi, focal lesion, or hydronephrosis. Bladder is unremarkable. Stomach/Bowel: Stomach is within normal limits. No evidence of bowel wall thickening, distention, or inflammatory changes. Intraluminal fat density mass in the ascending colon most consistent with a lipoma measuring 2.1 cm. Lipomatosis hypertrophy of the ileocecal valve. Vascular/Lymphatic: Abdominal aortic atherosclerosis. No lymphadenopathy. Reproductive: Status post hysterectomy. No adnexal masses. Other: No fluid collection or hematoma. Musculoskeletal: Bilateral total hip arthroplasties resulting in beam hardening artifact partially obscuring the lower pelvis. No acute osseous abnormality. Chronic L2, L3, L4 and L5 vertebral body compression fractures with methylmethacrylate within the vertebral bodies. Epidural extension of methylmethacrylate along the right paracentral aspect of the L2 vertebral body narrowing the right lateral recess. IMPRESSION: 1. No acute abdominal or pelvic pathology.  No bowel obstruction. 2. Intraluminal fat density mass in the ascending colon most consistent with a lipoma measuring 2.1 cm. 3. Small right pleural effusion. Electronically Signed   By: Kathreen Devoid   On: 12/06/2015 12:26   Dg Chest 2 View  Result Date: 12/22/2015 CLINICAL DATA:  Acute onset of generalized chest pain and weakness. Initial encounter. EXAM: CHEST  2 VIEW COMPARISON:  Chest radiograph from 11/12/2015 FINDINGS: The lungs are well-aerated. Peribronchial thickening is noted. Left basilar scarring is seen. There is no evidence of pleural effusion or pneumothorax. The heart is borderline normal in size. No acute osseous abnormalities are seen. Multiple chronic compression deformities are noted along the thoracic and upper lumbar spine, with associated changes of vertebroplasty at multiple levels. IMPRESSION: Peribronchial thickening noted.  Left basilar scarring seen. Electronically  Signed   By: Francoise Schaumann.D.  On: 11/30/2015 22:49   Dg Chest 2 View  Result Date: 11/12/2015 CLINICAL DATA:  Left-sided chest pain beginning yesterday EXAM: CHEST  2 VIEW COMPARISON:  05/18/2015 FINDINGS: Chronic hyperinflation. Mild bibasilar scarring. Borderline cardiomegaly. Stable mediastinal contours. Exaggerated thoracic kyphosis with multilevel compression fracture (T8 and T11) and vertebroplasty. No acute osseous finding. IMPRESSION: COPD and basilar scarring without superimposed finding. Electronically Signed   By: Monte Fantasia M.D.   On: 11/12/2015 16:54   Dg Chest Port 1 View  Result Date: 12/19/2015 CLINICAL DATA:  Sudden onset shortness of breath tonight. EXAM: PORTABLE CHEST 1 VIEW COMPARISON:  Chest radiographs 12/03/2015, included lung bases from CT abdomen/pelvis 12/06/2015 FINDINGS: Right pleural effusion, increased from prior chest radiograph. Mild vascular congestion. Unchanged mediastinal contours and heart size. Streaky left basilar scarring. No pneumothorax. Patient is rotated. IMPRESSION: Developing right pleural effusion and vascular congestion. Left basilar scarring. Electronically Signed   By: Jeb Levering M.D.   On: 12/07/2015 03:49   Dg Abd Portable 1v  Result Date: 12/06/2015 CLINICAL DATA:  Pt admitted for hyponatremia. Pt with complaints of abdominal pain and has been vomiting; Pt with history of alcohol abuse drinks 2 beers daily, HTN, and gastritis EXAM: PORTABLE ABDOMEN - 1 VIEW COMPARISON:  CT, 08/17/2015 FINDINGS: Normal bowel gas pattern.  No evidence of obstruction. No convincing renal or ureteral stones. There are atherosclerotic calcifications noted along a tortuous abdominal aorta. Bones are demineralized. There are bilateral hip arthroplasties and vertebroplasty has been performed at lower lumbar levels. IMPRESSION: 1. No acute findings.  No evidence bowel obstruction. Electronically Signed   By: Lajean Manes M.D.   On: 12/06/2015 08:21        TData Review     CBC w Diff:  Lab Results  Component Value Date   WBC 9.6 12/12/2015   HGB 13.6 12/28/2015   HGB 11.7 (L) 06/23/2014   HCT 38.8 12/16/2015   HCT 41.7 04/15/2015   PLT 137 (L) 12/28/2015   PLT 135 (L) 04/15/2015   LYMPHOPCT 18 11/12/2015   LYMPHOPCT 16.2 04/23/2013   MONOPCT 8 11/12/2015   MONOPCT 9.1 04/23/2013   EOSPCT 0 11/12/2015   EOSPCT 1.1 04/23/2013   BASOPCT 0 11/12/2015   BASOPCT 0.4 04/23/2013   CMP:  Lab Results  Component Value Date   NA 133 (L) 12/06/2015   NA 135 05/11/2015   NA 133 (L) 06/23/2014   K 3.8 12/06/2015   K 3.1 (L) 06/23/2014   CL 98 (L) 12/06/2015   CL 100 (L) 06/23/2014   CO2 26 12/06/2015   CO2 26 06/23/2014   BUN 6 12/06/2015   BUN 6 (L) 05/11/2015   BUN 10 06/23/2014   CREATININE 0.52 12/06/2015   CREATININE 0.59 06/23/2014   PROT 6.7 05/16/2015   PROT 6.5 05/11/2015   PROT 6.6 06/23/2014   ALBUMIN 3.7 05/16/2015   ALBUMIN 3.8 05/11/2015   ALBUMIN 3.4 (L) 06/23/2014   BILITOT 0.4 05/16/2015   BILITOT 0.5 05/11/2015   BILITOT 0.6 06/23/2014   ALKPHOS 116 05/16/2015   ALKPHOS 96 06/23/2014   AST 25 05/16/2015   AST 31 06/23/2014   ALT 14 05/16/2015   ALT 17 06/23/2014  .  Micro Results Recent Results (from the past 240 hour(s))  MRSA PCR Screening     Status: None   Collection Time: 12/05/15  4:13 AM  Result Value Ref Range Status   MRSA by PCR NEGATIVE NEGATIVE Final    Comment:  The GeneXpert MRSA Assay (FDA approved for NASAL specimens only), is one component of a comprehensive MRSA colonization surveillance program. It is not intended to diagnose MRSA infection nor to guide or monitor treatment for MRSA infections.      Code Status History    Date Active Date Inactive Code Status Order ID Comments User Context   12/26/2015  8:46 AM 2016-01-01  9:24 AM DNR [id number]  Max Sane, MD Inpatient   12/06/2015 10:28 AM 12/05/2015  8:46 AM DNR [id number]  Max Sane, MD Inpatient    12/05/2015  2:42 AM 12/06/2015 10:28 AM Full Code [id number]  Harrie Foreman, MD Inpatient   08/17/2015  8:29 AM 08/18/2015  9:59 PM Full Code [id number]  Baxter Hire, MD Inpatient   07/01/2014  4:41 PM 07/03/2014  5:59 PM Full Code [id number]  Nicholes Mango, MD Inpatient    Questions for Most Recent Historical Code Status (Order [id number])    Question Answer Comment   In the event of cardiac or respiratory ARREST Do not call a "code blue"    In the event of cardiac or respiratory ARREST Do not perform Intubation, CPR, defibrillation or ACLS    In the event of cardiac or respiratory ARREST Use medication by any route, position, wound care, and other measures to relive pain and suffering. May use oxygen, suction and manual treatment of airway obstruction as needed for comfort.         Advance Directive Documentation   Flowsheet Row Most Recent Value  Type of Advance Directive  Healthcare Power of Attorney  Pre-existing out of facility DNR order (yellow form or pink MOST form)  No data  "MOST" Form in Place?  No data            Discharge Medications     Medication List    ASK your doctor about these medications   acetaminophen 500 MG tablet Commonly known as:  TYLENOL Take 1 tablet (500 mg total) by mouth every 6 (six) hours as needed for mild pain, moderate pain, fever or headache.   albuterol 108 (90 Base) MCG/ACT inhaler Commonly known as:  PROVENTIL HFA;VENTOLIN HFA Inhale 2 puffs into the lungs every 6 (six) hours as needed for wheezing or shortness of breath.   feeding supplement (ENSURE ENLIVE) Liqd Take 237 mLs by mouth 2 (two) times daily between meals.   fexofenadine 180 MG tablet Commonly known as:  ALLEGRA ALLERGY Take 1 tablet (180 mg total) by mouth daily.          Total Time in preparing paper work, data evaluation and todays exam - 35 minutes  Dustin Flock M.D on 01-01-16 at 2:28 PM  [facility]   Office  [phone]

## 2015-12-29 NOTE — Progress Notes (Signed)
At 4:30AM, Chaplain on call received a page from a nurse reporting death. Chaplain went to the room, spent a quality time with the family, and prayed with and for them. Children of the passing mother were grieving her death. Chaplain spent sometime with the family and left to give them privacy in case they needed to discuss any funeral arrangements. Chaplain told the family that he is available for them anytime they needed him.

## 2015-12-29 NOTE — Patient Outreach (Signed)
[facility]) Care Management  [id number]  SHAKALA BASGALL [id number]   Will close to community case management per protocol.Patient deceased.   Joylene Draft, RN, [place] [facility] [phone]- Mobile [phone]- Toll Free Main Office

## 2015-12-29 NOTE — Progress Notes (Addendum)
Pt expired at 0422, noted no respiration, no heartbeat, and no pulse, verified by Dorthula Perfect, RN. Family at bedside, MD notified, [facility] notified. Chaplin at bedside with family.

## 2015-12-29 DEATH — deceased

## 2016-01-12 ENCOUNTER — Ambulatory Visit: Payer: Commercial Managed Care - HMO | Admitting: Family Medicine

## 2016-05-22 IMAGING — DX DG FOOT COMPLETE 3+V*R*
4 series · 4 of 4 positions shown · non-contrast
Comparison: None in PACs

CLINICAL DATA: Direct trauma with a 5 pound jugular of sugar to the
dorsum of the right foot with persistent pain and swelling over the
metatarsal in toe region.

EXAM:
RIGHT FOOT COMPLETE - 3+ VIEW

[foot ap]
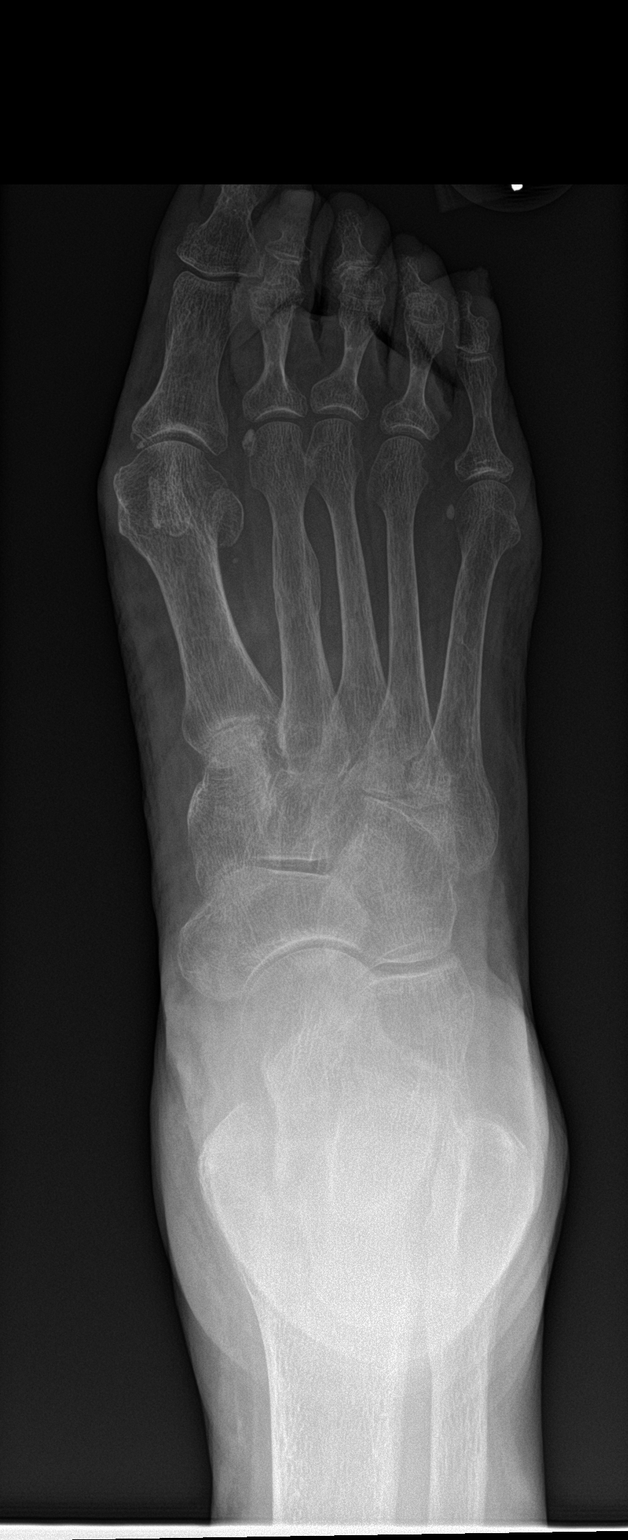

[foot obl]
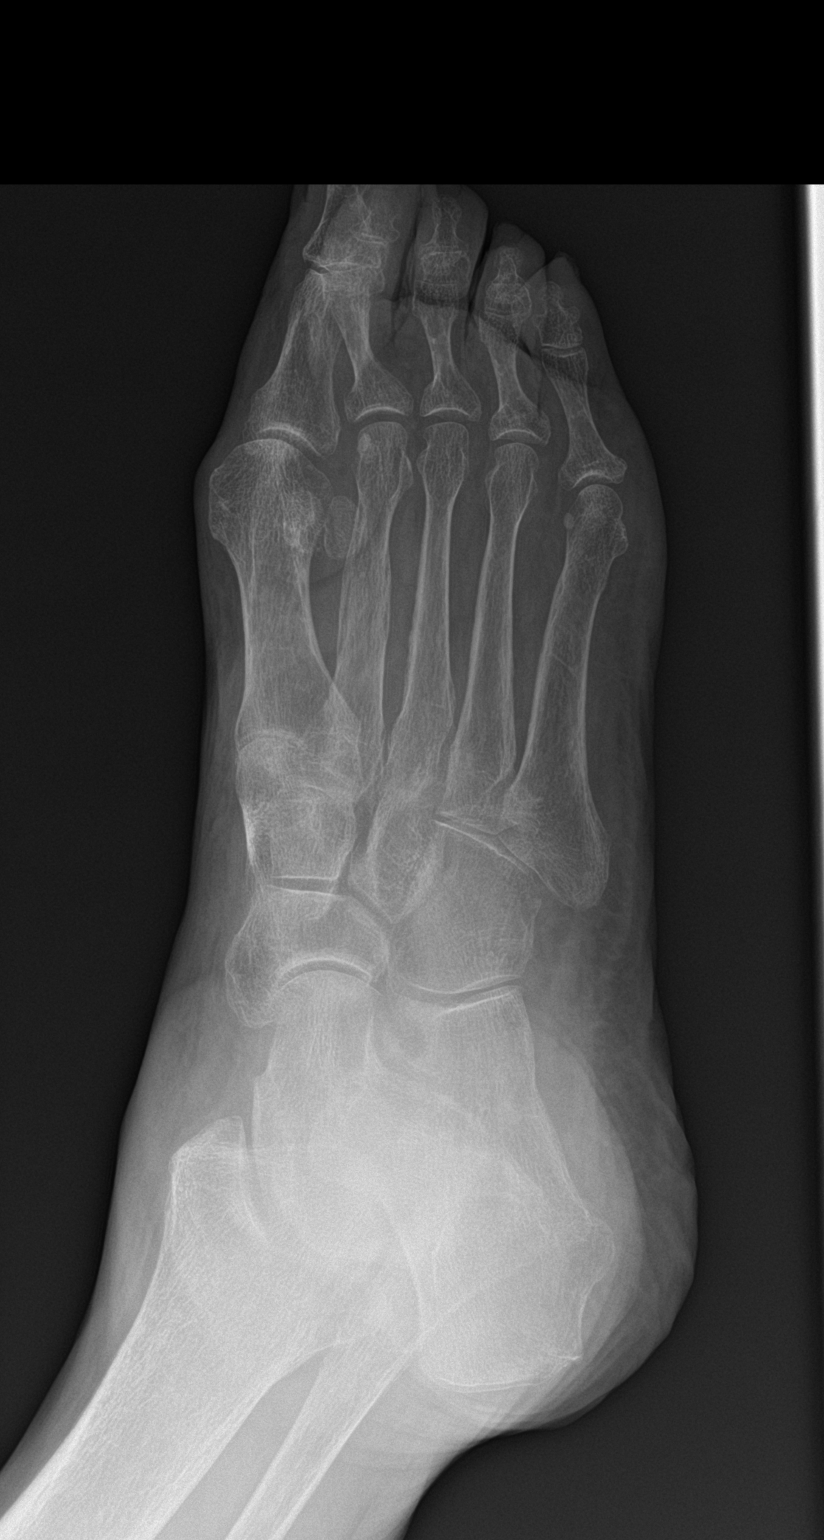

[foot lat (1 of 2)]
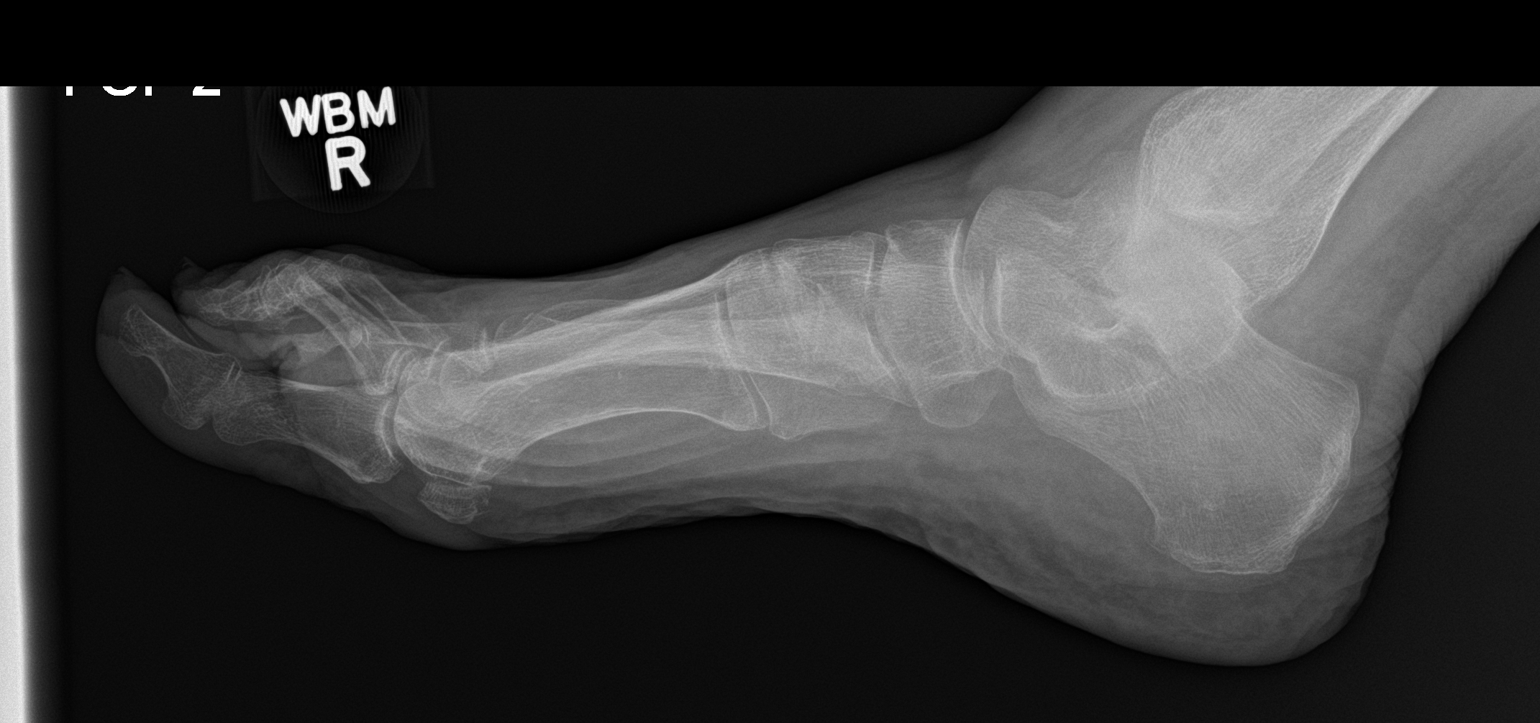

[foot lat (2 of 2)]
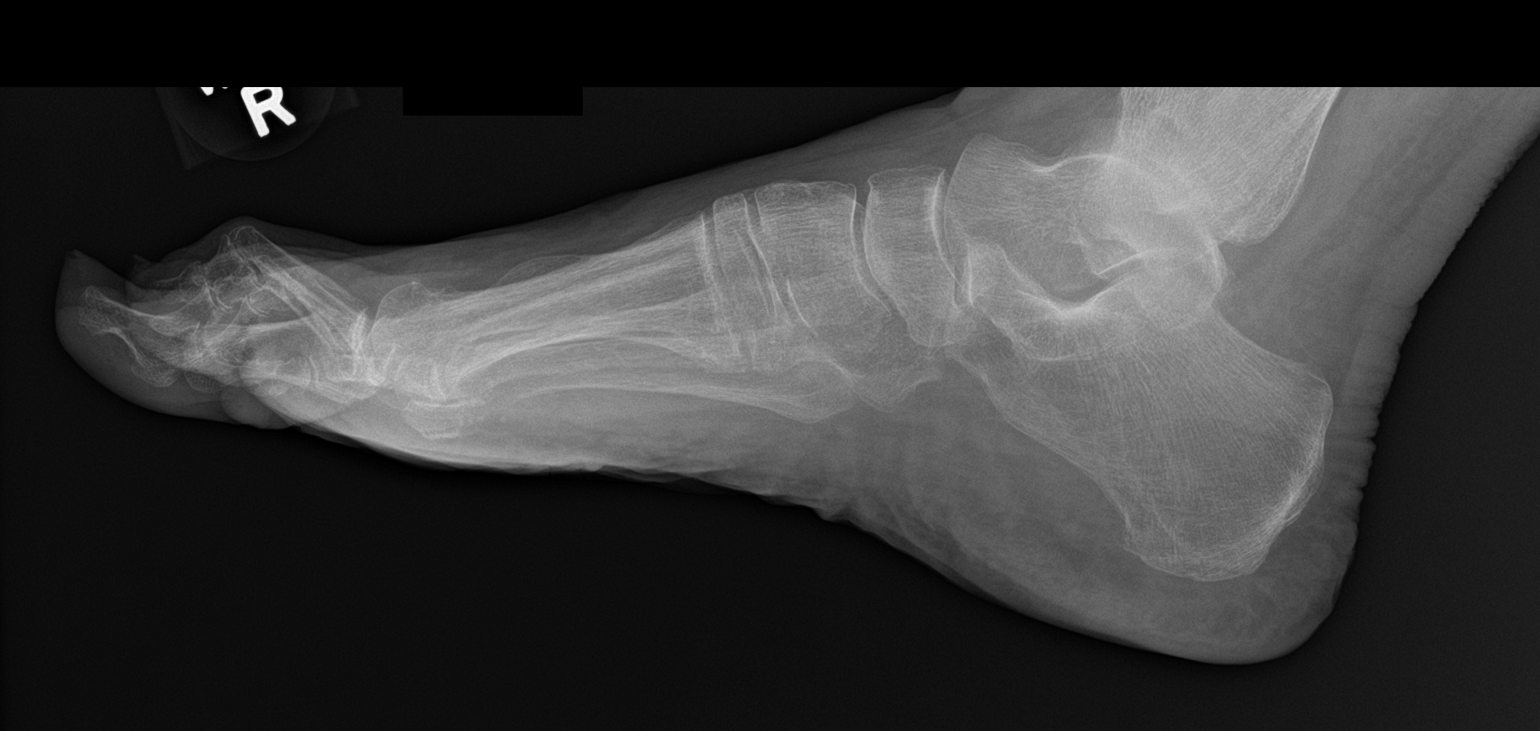

[4 of 4 positions shown; findings below may reference images not displayed]

FINDINGS: The bones are diffusely osteopenic. No acute fracture or dislocation
of the phalanges or metatarsals is observed. The joint spaces are
reasonably well maintained for age. There is a mild hallux valgus
contour of the first ray. The bones of the hindfoot appear intact.
There is very mild soft tissue swelling over the dorsum of the foot.
There is periosteal reaction at close the midshaft of the second
metatarsal which likely reflects an old fracture.
IMPRESSION: No acute fracture nor dislocation of the bones of the right foot are
observed. A previous fracture of the shaft of the second metacarpal
has healed with considerable cortical thickening.

## 2016-11-13 IMAGING — DX DG ABD PORTABLE 1V
1 series · 1 of 1 positions shown · non-contrast
Comparison: CT, 08/17/2015

CLINICAL DATA: Pt admitted for hyponatremia. Pt with complaints of
abdominal pain and has been vomiting; Pt with history of alcohol
abuse drinks 2 beers daily, HTN, and gastritis

EXAM:
PORTABLE ABDOMEN - 1 VIEW

[abdomen kub]
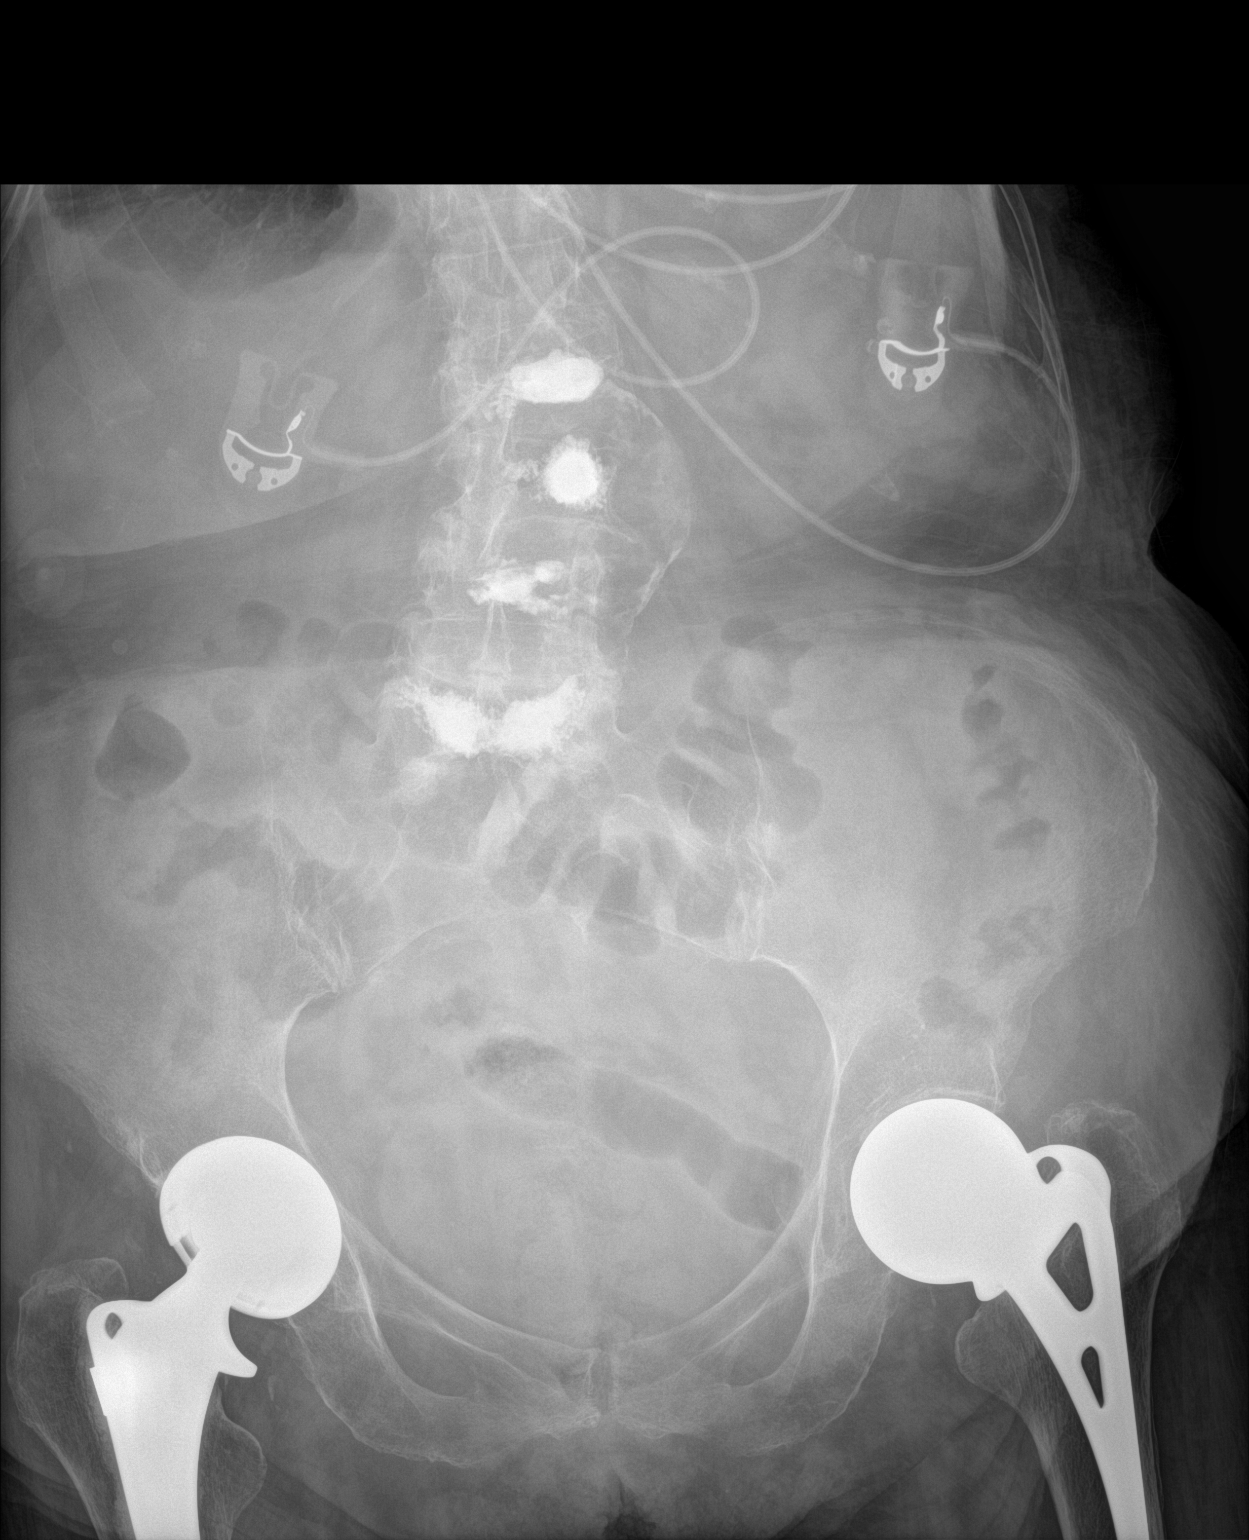

[1 of 1 positions shown; findings below may reference images not displayed]

FINDINGS: Normal bowel gas pattern.  No evidence of obstruction.

No convincing renal or ureteral stones.

There are atherosclerotic calcifications noted along a tortuous
abdominal aorta.

Bones are demineralized. There are bilateral hip arthroplasties and
vertebroplasty has been performed at lower lumbar levels.
IMPRESSION: 1. No acute findings.  No evidence bowel obstruction.

## 2016-11-13 IMAGING — CT CT ABD-PELV W/O CM
2 of 4 series · 16 of 46 positions shown, 18 images · non-contrast
Comparison: None.

CLINICAL DATA: Chest pain, nausea, vomiting for 1 week

EXAM:
CT ABDOMEN AND PELVIS WITHOUT CONTRAST
TECHNIQUE: Multidetector CT imaging of the abdomen and pelvis was performed
following the standard protocol without IV contrast.

[Series 2: axial st · axial · 0.82mm/px · z∈[-510,-180]mm · 13 of 74 slices shown, 15 images]
[im 4/74  soft-tissue]
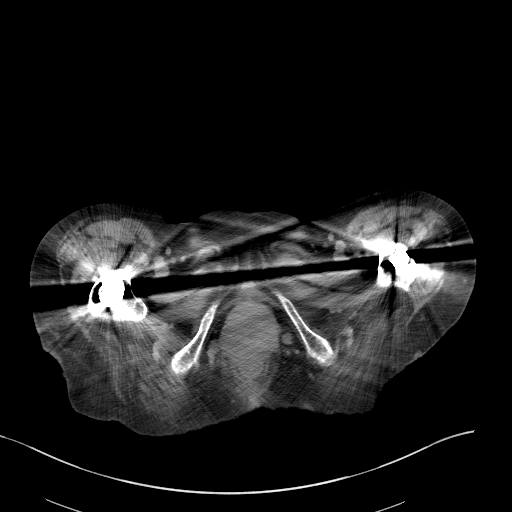
[im 4/74  bone]
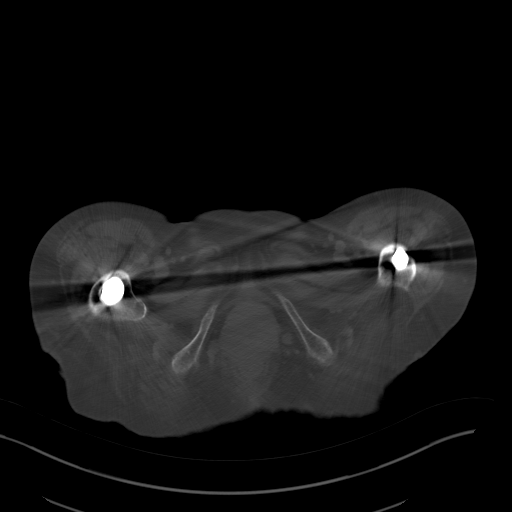
[im 13/74  soft-tissue]
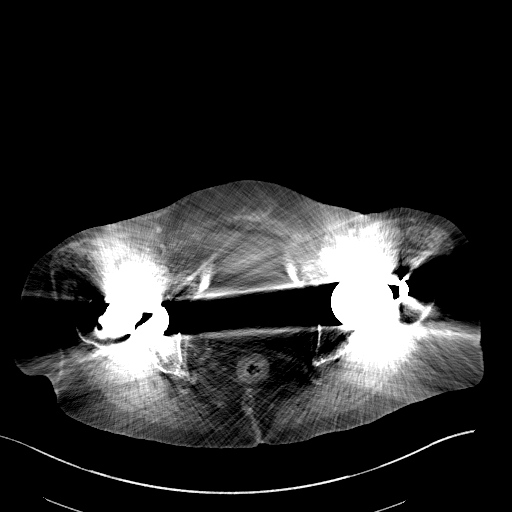
[im 20/74  soft-tissue]
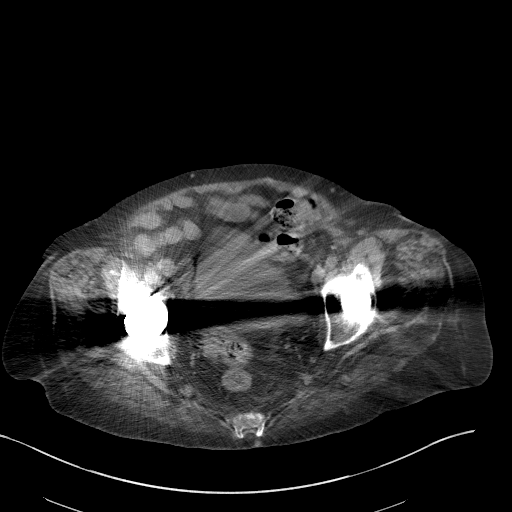
[im 23/74  soft-tissue]
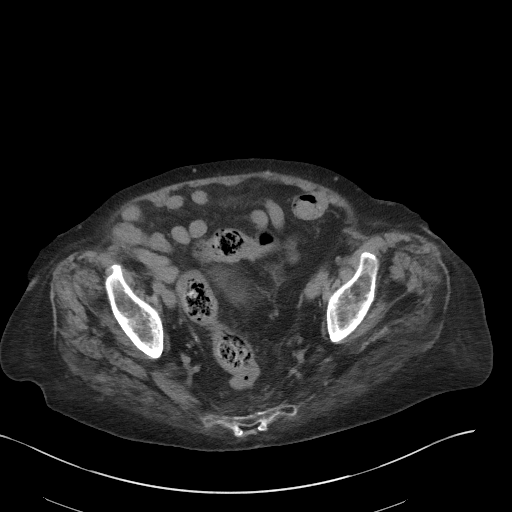
[im 29/74  soft-tissue]
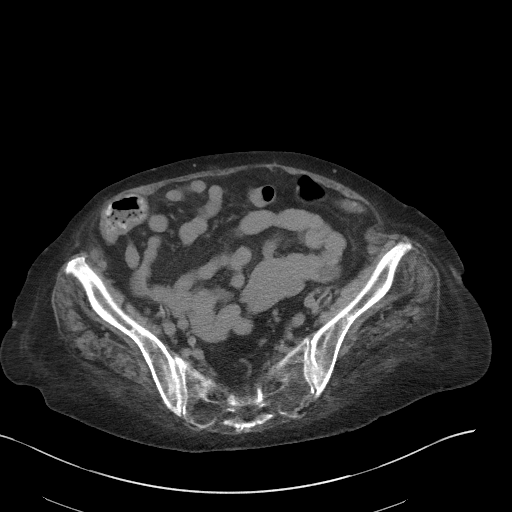
[im 32/74  soft-tissue]
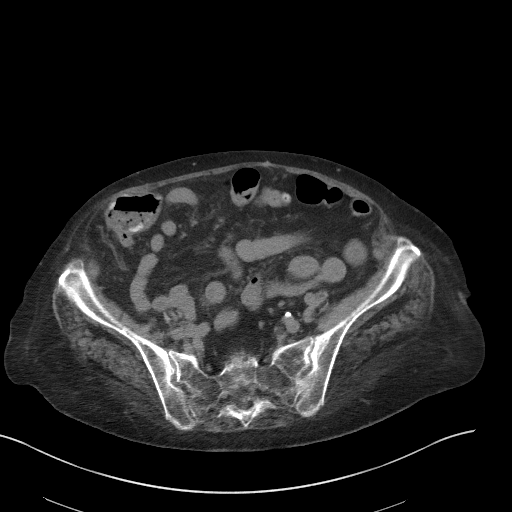
[im 39/74  soft-tissue]
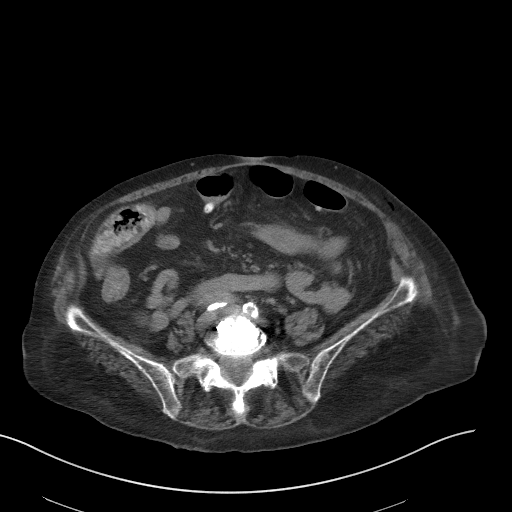
[im 45/74  soft-tissue]
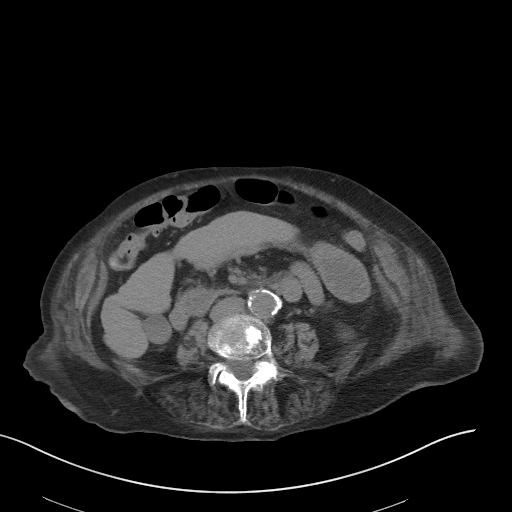
[im 48/74  soft-tissue]
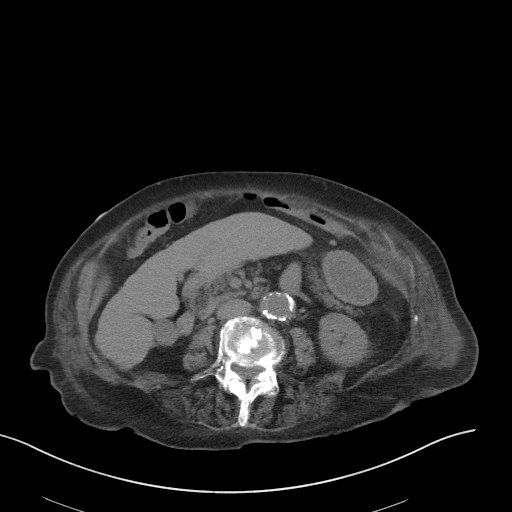
[im 48/74  bone]
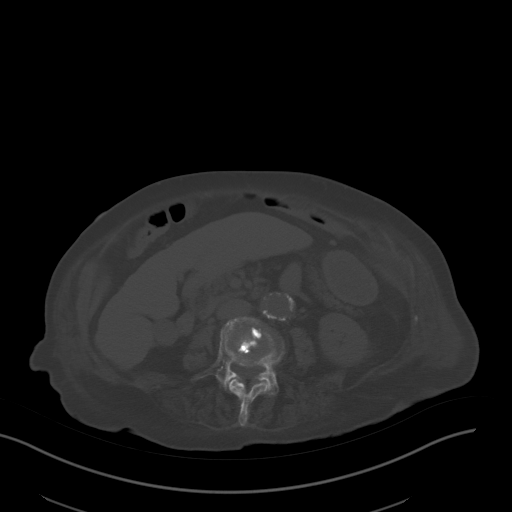
[im 54/74  soft-tissue]
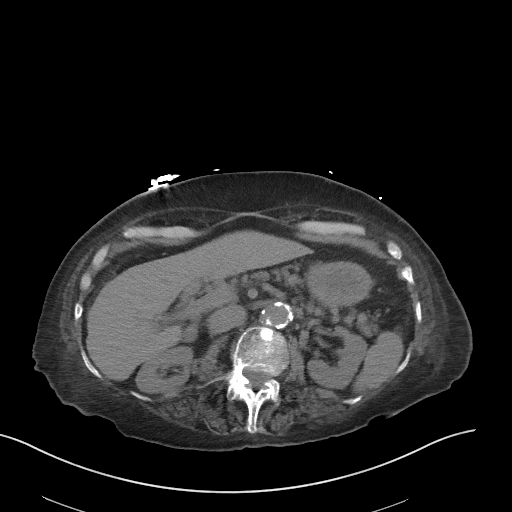
[im 58/74  soft-tissue]
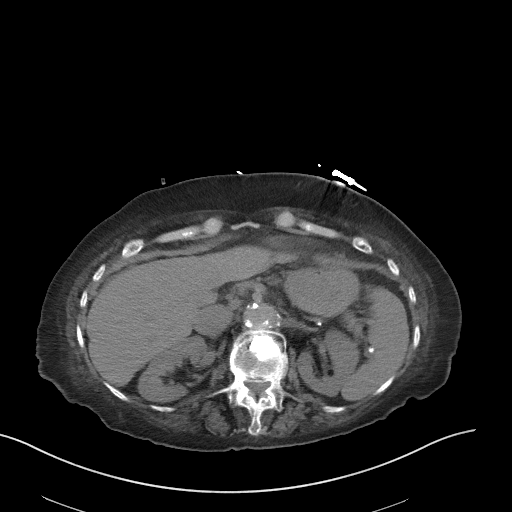
[im 64/74  soft-tissue]
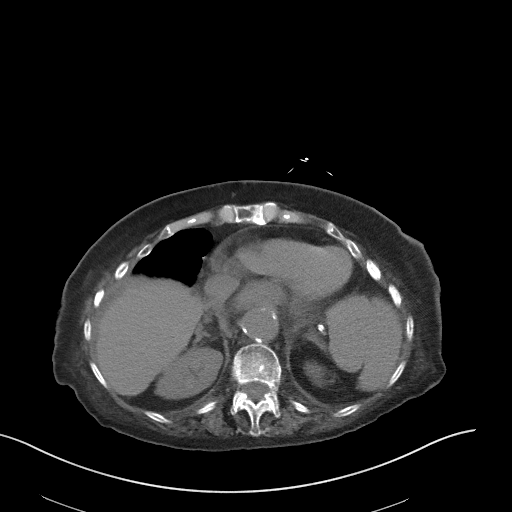
[im 70/74  soft-tissue]
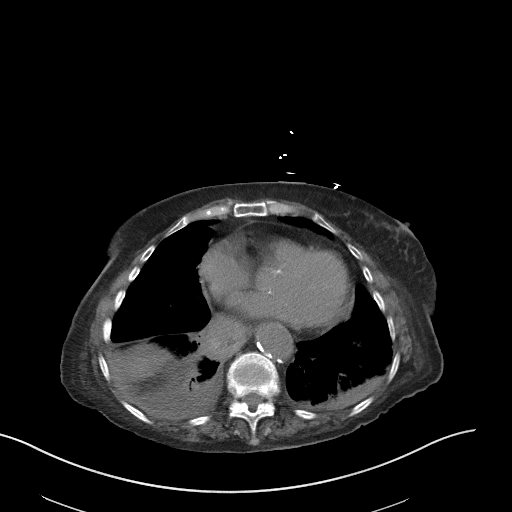

[Series 5: coronal st · coronal · 0.74mm/px · 3 of 80 slices shown]
[im 27/80  soft-tissue]
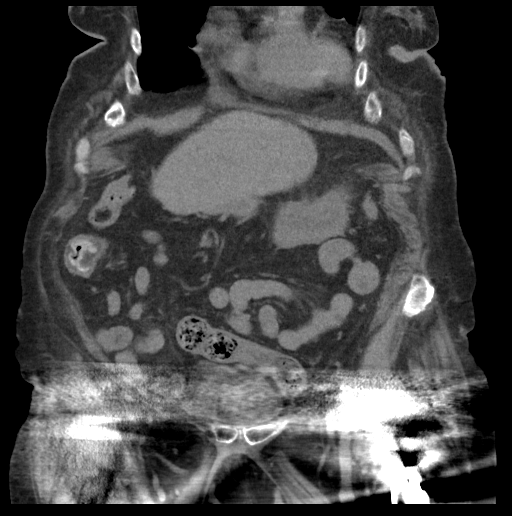
[im 36/80  soft-tissue]
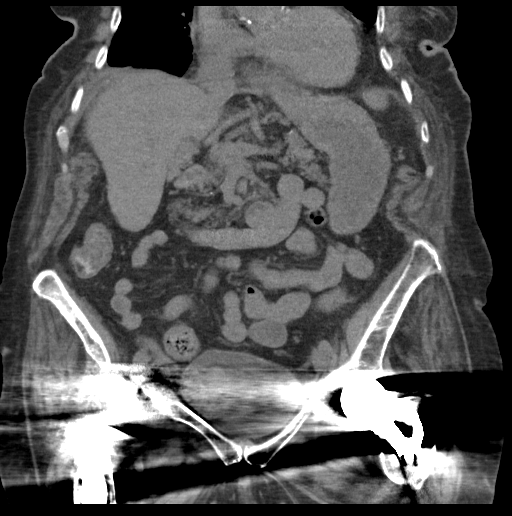
[im 44/80  soft-tissue]
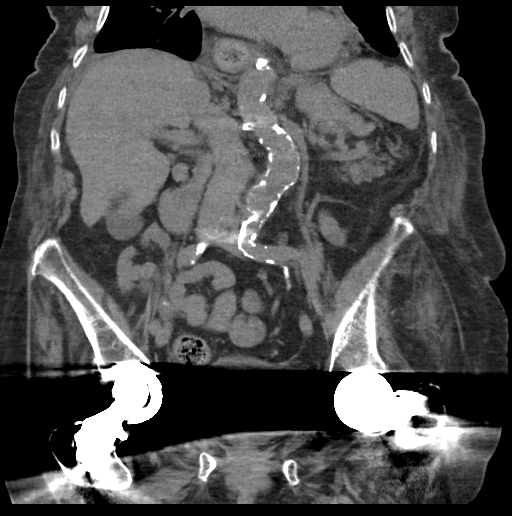

[16 of 46 positions shown; findings below may reference images not displayed]

FINDINGS: Lower chest: Small right pleural effusion. No left pleural effusion.
Bibasilar atelectasis.

Hepatobiliary: New no focal hepatic mass. Slightly nodular contour
of the liver as can be seen with hepatic cellular disease such as
cirrhosis. Normal gallbladder.

Pancreas: Unremarkable. No pancreatic ductal dilatation or
surrounding inflammatory changes.

Spleen: Normal in size without focal abnormality.

Adrenals/Urinary Tract: Adrenal glands are unremarkable. Kidneys are
normal, without renal calculi, focal lesion, or hydronephrosis.
Bladder is unremarkable.

Stomach/Bowel: Stomach is within normal limits. No evidence of bowel
wall thickening, distention, or inflammatory changes. Intraluminal
fat density mass in the ascending colon most consistent with a
lipoma measuring 2.1 cm. Lipomatosis hypertrophy of the ileocecal
valve.

Vascular/Lymphatic: Abdominal aortic atherosclerosis. No
lymphadenopathy.

Reproductive: Status post hysterectomy. No adnexal masses.

Other: No fluid collection or hematoma.

Musculoskeletal: Bilateral total hip arthroplasties resulting in
beam hardening artifact partially obscuring the lower pelvis. No
acute osseous abnormality. Chronic L2, L3, L4 and L5 vertebral body
compression fractures with methylmethacrylate within the vertebral
bodies. Epidural extension of methylmethacrylate along the right
paracentral aspect of the L2 vertebral body narrowing the right
lateral recess.
IMPRESSION: 1. No acute abdominal or pelvic pathology.  No bowel obstruction.
2. Intraluminal fat density mass in the ascending colon most
consistent with a lipoma measuring 2.1 cm.
3. Small right pleural effusion.
